# Patient Record
Sex: Male | Born: 1960
Health system: Southern US, Community
[De-identification: ages and names within clinical notes are randomized; demographics above are authoritative.]

## PROBLEM LIST (undated history)

## (undated) DIAGNOSIS — I10 Essential (primary) hypertension: Secondary | ICD-10-CM

## (undated) DIAGNOSIS — F32A Depression, unspecified: Secondary | ICD-10-CM

## (undated) DIAGNOSIS — S46101A Unspecified injury of muscle, fascia and tendon of long head of biceps, right arm, initial encounter: Secondary | ICD-10-CM

## (undated) DIAGNOSIS — T7840XA Allergy, unspecified, initial encounter: Secondary | ICD-10-CM

## (undated) DIAGNOSIS — F329 Major depressive disorder, single episode, unspecified: Secondary | ICD-10-CM

## (undated) DIAGNOSIS — E785 Hyperlipidemia, unspecified: Secondary | ICD-10-CM

## (undated) DIAGNOSIS — I2510 Atherosclerotic heart disease of native coronary artery without angina pectoris: Secondary | ICD-10-CM

## (undated) DIAGNOSIS — R011 Cardiac murmur, unspecified: Secondary | ICD-10-CM

## (undated) HISTORY — DX: Hyperlipidemia, unspecified: E78.5

## (undated) HISTORY — PX: CARDIAC CATHETERIZATION: SHX172

## (undated) HISTORY — PX: MOUTH SURGERY: SHX715

## (undated) HISTORY — PX: TONSILLECTOMY: SUR1361

## (undated) HISTORY — PX: OTHER SURGICAL HISTORY: SHX169

## (undated) HISTORY — DX: Essential (primary) hypertension: I10

## (undated) HISTORY — PX: APPENDECTOMY: SHX54

## (undated) HISTORY — DX: Depression, unspecified: F32.A

## (undated) HISTORY — DX: Major depressive disorder, single episode, unspecified: F32.9

---

## 1993-02-21 HISTORY — PX: TONSILLECTOMY: SHX5217

## 2003-02-28 ENCOUNTER — Encounter: Admission: RE | Admit: 2003-02-28 | Discharge: 2003-02-28 | Payer: Self-pay | Admitting: Sports Medicine

## 2008-11-03 ENCOUNTER — Ambulatory Visit: Payer: Self-pay | Admitting: Family Medicine

## 2008-11-03 DIAGNOSIS — I10 Essential (primary) hypertension: Secondary | ICD-10-CM | POA: Insufficient documentation

## 2008-11-03 DIAGNOSIS — E785 Hyperlipidemia, unspecified: Secondary | ICD-10-CM | POA: Insufficient documentation

## 2008-11-03 DIAGNOSIS — F329 Major depressive disorder, single episode, unspecified: Secondary | ICD-10-CM | POA: Insufficient documentation

## 2008-11-03 LAB — CONVERTED CEMR LAB
ALT: 25 units/L (ref 0–53)
AST: 24 units/L (ref 0–37)
Albumin: 4.5 g/dL (ref 3.5–5.2)
Alkaline Phosphatase: 62 units/L (ref 39–117)
BUN: 14 mg/dL (ref 6–23)
Bilirubin, Direct: 0 mg/dL (ref 0.0–0.3)
CO2: 30 meq/L (ref 19–32)
Calcium: 9.4 mg/dL (ref 8.4–10.5)
Chloride: 106 meq/L (ref 96–112)
Cholesterol: 162 mg/dL (ref 0–200)
Creatinine, Ser: 0.9 mg/dL (ref 0.4–1.5)
GFR calc non Af Amer: 95.25 mL/min (ref >60)
Glucose, Bld: 106 mg/dL — ABNORMAL HIGH (ref 70–99)
HDL: 42.2 mg/dL (ref >39.00)
LDL Cholesterol: 104 mg/dL — ABNORMAL HIGH (ref 0–99)
Potassium: 5.2 meq/L — ABNORMAL HIGH (ref 3.5–5.1)
Sodium: 141 meq/L (ref 135–145)
Total Bilirubin: 0.8 mg/dL (ref 0.3–1.2)
Total CHOL/HDL Ratio: 4
Total Protein: 7.9 g/dL (ref 6.0–8.3)
Triglycerides: 78 mg/dL (ref 0.0–149.0)
VLDL: 15.6 mg/dL (ref 0.0–40.0)

## 2009-01-05 ENCOUNTER — Telehealth: Payer: Self-pay | Admitting: Family Medicine

## 2009-10-29 ENCOUNTER — Ambulatory Visit: Payer: Self-pay | Admitting: Family Medicine

## 2009-10-29 LAB — CONVERTED CEMR LAB
ALT: 24 units/L (ref 0–53)
AST: 23 units/L (ref 0–37)
Albumin: 4.2 g/dL (ref 3.5–5.2)
Alkaline Phosphatase: 53 units/L (ref 39–117)
BUN: 18 mg/dL (ref 6–23)
Basophils Absolute: 0 10*3/uL (ref 0.0–0.1)
Basophils Relative: 0.5 % (ref 0.0–3.0)
Bilirubin Urine: NEGATIVE
Bilirubin, Direct: 0.1 mg/dL (ref 0.0–0.3)
CO2: 30 meq/L (ref 19–32)
Calcium: 9 mg/dL (ref 8.4–10.5)
Chloride: 105 meq/L (ref 96–112)
Cholesterol: 132 mg/dL (ref 0–200)
Creatinine, Ser: 0.8 mg/dL (ref 0.4–1.5)
Eosinophils Absolute: 0.2 10*3/uL (ref 0.0–0.7)
Eosinophils Relative: 2.1 % (ref 0.0–5.0)
GFR calc non Af Amer: 111.9 mL/min (ref >60)
Glucose, Bld: 85 mg/dL (ref 70–99)
HCT: 39.9 % (ref 39.0–52.0)
HDL: 36.1 mg/dL — ABNORMAL LOW (ref >39.00)
Hemoglobin, Urine: NEGATIVE
Hemoglobin: 13.7 g/dL (ref 13.0–17.0)
Ketones, ur: NEGATIVE mg/dL
LDL Cholesterol: 68 mg/dL (ref 0–99)
Leukocytes, UA: NEGATIVE
Lymphocytes Relative: 21.6 % (ref 12.0–46.0)
Lymphs Abs: 1.9 10*3/uL (ref 0.7–4.0)
MCHC: 34.4 g/dL (ref 30.0–36.0)
MCV: 86.8 fL (ref 78.0–100.0)
Monocytes Absolute: 0.8 10*3/uL (ref 0.1–1.0)
Monocytes Relative: 9.5 % (ref 3.0–12.0)
Neutro Abs: 5.7 10*3/uL (ref 1.4–7.7)
Neutrophils Relative %: 66.3 % (ref 43.0–77.0)
Nitrite: NEGATIVE
PSA: 1.94 ng/mL (ref 0.10–4.00)
Platelets: 192 10*3/uL (ref 150.0–400.0)
Potassium: 4.6 meq/L (ref 3.5–5.1)
RBC: 4.6 M/uL (ref 4.22–5.81)
RDW: 13.6 % (ref 11.5–14.6)
Sodium: 140 meq/L (ref 135–145)
Specific Gravity, Urine: >=1.03 (ref 1.000–1.030)
TSH: 0.77 microintl units/mL (ref 0.35–5.50)
Total Bilirubin: 0.6 mg/dL (ref 0.3–1.2)
Total CHOL/HDL Ratio: 4
Total Protein, Urine: NEGATIVE mg/dL
Total Protein: 6.5 g/dL (ref 6.0–8.3)
Triglycerides: 138 mg/dL (ref 0.0–149.0)
Urine Glucose: NEGATIVE mg/dL
Urobilinogen, UA: 0.2 (ref 0.0–1.0)
VLDL: 27.6 mg/dL (ref 0.0–40.0)
WBC: 8.6 10*3/uL (ref 4.5–10.5)
pH: 6 (ref 5.0–8.0)

## 2009-11-09 ENCOUNTER — Ambulatory Visit: Payer: Self-pay | Admitting: Family Medicine

## 2009-11-18 ENCOUNTER — Encounter (INDEPENDENT_AMBULATORY_CARE_PROVIDER_SITE_OTHER): Payer: Self-pay | Admitting: *Deleted

## 2009-12-24 ENCOUNTER — Encounter (INDEPENDENT_AMBULATORY_CARE_PROVIDER_SITE_OTHER): Payer: Self-pay | Admitting: *Deleted

## 2009-12-29 ENCOUNTER — Ambulatory Visit: Payer: Self-pay | Admitting: Gastroenterology

## 2010-01-12 ENCOUNTER — Ambulatory Visit: Payer: Self-pay | Admitting: Gastroenterology

## 2010-01-18 ENCOUNTER — Encounter: Payer: Self-pay | Admitting: Gastroenterology

## 2010-01-18 LAB — HM COLONOSCOPY

## 2010-03-23 NOTE — Progress Notes (Signed)
Summary: Samples Viagra  Phone Note Call from Patient Call back at Home Phone [phone] Call back at Work Phone [phone]   Caller: live Call For: Damon [name] Summary of Call: Requesting samples & Rx Viagra to [facility].  Just had checkup & everything was OK.  OV only if Dr. B says he needs it. Initial call taken by: Rudy Jew, RN,  January 05, 2009 8:43 AM  Follow-up for Phone Call        No samples at this time.  OK to refill Viagra 100 mg one by mouth once daily as needed #6 with as needed refills. Follow-up by: Evelena Peat MD,  January 05, 2009 8:57 AM  Additional Follow-up for Phone Call Additional follow up Details #1::        LMTCB Rudy Jew, RN  January 05, 2009 2:39 PM  Pt informed Rx sent to [place] [facility].  Additional Follow-up by: Sid Falcon LPN,  January 06, 2009 10:43 AM    New/Updated Medications: VIAGRA 100 MG TABS (SILDENAFIL CITRATE) One by mouth once daily prn Prescriptions: VIAGRA 100 MG TABS (SILDENAFIL CITRATE) One by mouth once daily prn  #6 x prn   Entered by:   Rudy Jew, RN   Authorized by:   Evelena Peat MD   Signed by:   Rudy Jew, RN on 01/05/2009   Method used:   Electronically to        [facility]* (retail)       [address]       [place], [place]  [address]       Ph: [phone]       Fax: [phone]   RxID:   [id number]

## 2010-03-23 NOTE — Letter (Signed)
Summary: Pre Visit Letter Revised  [facility]  [address] [place], [place] [address]   Phone: [phone]  Fax: [phone]        11/18/2009 MRN: [id number] Damon [name] [address] [place], [place]  [address]             Procedure Date:  01-12-10   Welcome to the Gastroenterology Division at [facility]    You are scheduled to see a nurse for your pre-procedure visit on 12-29-09 at 8:30a.m. on the 3rd floor at [facility], [address]  We ask that you try to arrive at our office 15 minutes prior to your appointment time to allow for check-in.  Please take a minute to review the attached form.  If you answer "Yes" to one or more of the questions on the first page, we ask that you call the person listed at your earliest opportunity.  If you answer "No" to all of the questions, please complete the rest of the form and bring it to your appointment.    Your nurse visit will consist of discussing your medical and surgical history, your immediate family medical history, and your medications.   If you are unable to list all of your medications on the form, please bring the medication bottles to your appointment and we will list them.  We will need to be aware of both prescribed and over the counter drugs.  We will need to know exact dosage information as well.    Please be prepared to read and sign documents such as consent forms, a financial agreement, and acknowledgement forms.  If necessary, and with your consent, a friend or relative is welcome to sit-in on the nurse visit with you.  Please bring your insurance card so that we may make a copy of it.  If your insurance requires a referral to see a specialist, please bring your referral form from your primary care physician.  No co-pay is required for this nurse visit.     If you cannot keep your appointment, please call [phone] to cancel or reschedule prior to your appointment date.  This allows [place]  the opportunity to schedule an appointment for another patient in need of care.    Thank you for choosing [facility] for your medical needs.  We appreciate the opportunity to care for you.  Please visit [place] at our website  to learn more about our practice.  Sincerely, The Gastroenterology Division

## 2010-03-23 NOTE — Letter (Signed)
Summary: Patient Notice- Polyp Results  [facility]  [address] [place], [place] [address]   Phone: [phone]  Fax: [phone]        January 18, 2010 MRN: [id number]    Damon [name] [address] [place], [place]  [address]    Dear Damon [name],  I am pleased to inform you that the colon polyp(s) removed during your recent colonoscopy was (were) found to be benign (no cancer detected) upon pathologic examination.  I recommend you have a repeat colonoscopy examination in _5 years to look for recurrent polyps, as having colon polyps increases your risk for having recurrent polyps or even colon cancer in the future.  Should you develop new or worsening symptoms of abdominal pain, bowel habit changes or bleeding from the rectum or bowels, please schedule an evaluation with either your primary care physician or with me.  Additional information/recommendations:  __ No further action with gastroenterology is needed at this time. Please      follow-up with your primary care physician for your other healthcare      needs.  __ Please call [phone] to schedule a return visit to review your      situation.  __ Please keep your follow-up visit as already scheduled.  __x Continue treatment plan as outlined the day of your exam.  Please call us if you are having persistent problems or have questions about your condition that have not been fully answered at this time.  Sincerely,  Louis Meckel MD  This letter has been electronically signed by your physician.  Appended Document: Patient Notice- Polyp Results Letter mailed

## 2010-03-23 NOTE — Miscellaneous (Signed)
Summary: [facility]  Clinical Lists Changes  Medications: Added new medication of MOVIPREP 100 GM  SOLR (PEG-KCL-NACL-NASULF-NA ASC-C) As per prep instructions. - Signed Rx of MOVIPREP 100 GM  SOLR (PEG-KCL-NACL-NASULF-NA ASC-C) As per prep instructions.;  #1 x 0;  Signed;  Entered by: Ezra Sites RN;  Authorized by: Louis Meckel MD;  Method used: Electronically to [facility]*, [address], [place], [place]  [address], Ph: [phone], Fax: [phone] Observations: Added new observation of NKA: T (12/29/2009 8:38)    Prescriptions: MOVIPREP 100 GM  SOLR (PEG-KCL-NACL-NASULF-NA ASC-C) As per prep instructions.  #1 x 0   Entered by:   Ezra Sites RN   Authorized by:   Louis Meckel MD   Signed by:   Ezra Sites RN on 12/29/2009   Method used:   Electronically to        [facility]* (retail)       [address]       [place], [place]  [id number]       Ph: [phone]       Fax: [phone]   RxID:   [id number]

## 2010-03-23 NOTE — Procedures (Signed)
Summary: Colonoscopy  Patient: Damon [name] Note: All result statuses are Final unless otherwise noted.  Tests: (1) Colonoscopy (COL)   COL Colonoscopy           DONE     [facility]     [address]     [place], [place]  [address]           COLONOSCOPY PROCEDURE REPORT           PATIENT:  [name], Damon  MR#:  [id number]     BIRTHDATE:  06/20/1960, 49 yrs. old  GENDER:  male           ENDOSCOPIST:  Barbette Hair. Arlyce Dice, MD     Referred by:  Evelena Peat, M.D.           PROCEDURE DATE:  01/12/2010     PROCEDURE:  Colonoscopy with snare polypectomy     ASA CLASS:  Class I     INDICATIONS:  1) Routine Risk Screening           MEDICATIONS:   Fentanyl 100 mcg IV, Versed 10 mg IV, Benadryl 25     mg IV           DESCRIPTION OF PROCEDURE:   After the risks benefits and     alternatives of the procedure were thoroughly explained, informed     consent was obtained.  Digital rectal exam was performed and     revealed no abnormalities.   The [id number] endoscope was     introduced through the anus and advanced to the cecum, which was     identified by the ileocecal valve, without limitations.  The     quality of the prep was excellent, using MoviPrep.  The instrument     was then slowly withdrawn as the colon was fully examined.     <<PROCEDUREIMAGES>>           FINDINGS:  A sessile polyp was found in the descending colon. It     was 4 mm in size. Polyp was snared without cautery. Retrieval was     successful (see image8). snare polyp  This was otherwise a normal     examination of the colon (see image1, image2, image3, image5,     image7, image12, and image14).   Retroflexed views in the rectum     revealed no abnormalities.    The time to cecum =  3.50  minutes.     The scope was then withdrawn (time =  13.75  min) from the patient     and the procedure completed.           COMPLICATIONS:  None           ENDOSCOPIC IMPRESSION:     1) 4 mm sessile polyp in the  descending colon     2) Otherwise normal examination     RECOMMENDATIONS:     1) If the polyp(s) removed today are proven to be adenomatous     (pre-cancerous) polyps, you will need a repeat colonoscopy in 5     years. Otherwise you should continue to follow colorectal cancer     screening guidelines for "routine risk" patients with colonoscopy     in 10 years.           REPEAT EXAM:   You will receive a letter from Dr. Arlyce Dice in 1-2     weeks, after reviewing the final pathology, with followup  recommendations.           ______________________________     Barbette Hair Arlyce Dice, MD           CC:           n.     eSIGNED:   Barbette Hair. Brea Coleson at 01/12/2010 09:51 AM           Geannie Risen, [id number]  Note: An exclamation mark (!) indicates a result that was not dispersed into the flowsheet. Document Creation Date: 01/12/2010 9:51 AM _______________________________________________________________________  (1) Order result status: Final Collection or observation date-time: 01/12/2010 09:46 Requested date-time:  Receipt date-time:  Reported date-time:  Referring Physician:   Ordering Physician: Melvia Heaps [phone]) Specimen Source:  Source: Launa Grill Order Number: [id number] Lab site:   Appended Document: Colonoscopy     Procedures Next Due Date:    Colonoscopy: 12/2014

## 2010-03-23 NOTE — Assessment & Plan Note (Signed)
Summary: CPX // RS   Vital Signs:  Patient profile:   50 year old male Height:      67 inches Weight:      167 pounds BMI:     26.25 O2 Sat:      98 % Temp:     87.8 degrees F oral Pulse rate:   73 / minute Resp:     12 per minute BP sitting:   108 / 78  Vitals Entered By: Lynann Beaver CMA (November 09, 2009 10:34 AM)  Nutrition Counseling: Patient's BMI is greater than 25 and therefore counseled on weight management options. CC: cpx, Hypertension Management Is Patient Diabetic? No Pain Assessment Patient in pain? no        History of Present Illness: Pt here for CPE.  Has hx of hypertension and hyperlipidemia.  Compliant with meds.  Last tetanus unknown. No hx of colonoscopy. Declines flu vaccine. Exercising regularly.  Acute issue of L shoulder pain for about 3 months.  Noted after overuse activity with pressure washing. Radiates to deltoid.  Exacerbated by lifting.  Achy quality.  Worse with abduction. No alleviating factors.  Hypertension History:      He denies headache, chest pain, palpitations, dyspnea with exertion, orthopnea, PND, peripheral edema, visual symptoms, neurologic problems, syncope, and side effects from treatment.  He notes no problems with any antihypertensive medication side effects.        Positive major cardiovascular risk factors include male age 37 years old or older, hyperlipidemia, and hypertension.     Current Medications (verified): 1)  Lipitor 20 Mg Tabs (Atorvastatin Calcium) .... Once Daily 2)  Lisinopril 20 Mg Tabs (Lisinopril) .... Once Daily 3)  Viagra 100 Mg Tabs (Sildenafil Citrate) .... One By Mouth Once Daily Prn  Allergies (verified): No Known Drug Allergies  Past History:  Past Medical History: Last updated: 11/03/2008 Depression Hyperlipidemia Hypertension  Past Surgical History: Last updated: 11/03/2008 Tonsillectomy 1995  Family History: Last updated: 11/03/2008 Family History Depression Family  History High cholesterol Family History Hypertension Family History of Prostate CA  Family History of Stroke F  Family history heart disease  Social History: Last updated: 11/03/2008 Occupation: [employer] Divorced Past smoker, quit at  age 74 PMH-FH-SH reviewed for relevance  Review of Systems  The patient denies anorexia, fever, weight loss, weight gain, vision loss, decreased hearing, hoarseness, chest pain, syncope, dyspnea on exertion, peripheral edema, prolonged cough, headaches, hemoptysis, abdominal pain, melena, hematochezia, severe indigestion/heartburn, hematuria, incontinence, genital sores, muscle weakness, suspicious skin lesions, transient blindness, difficulty walking, depression, unusual weight change, abnormal bleeding, enlarged lymph nodes, and testicular masses.    Physical Exam  General:  Well-developed,well-nourished,in no acute distress; alert,appropriate and cooperative throughout examination Head:  Normocephalic and atraumatic without obvious abnormalities. No apparent alopecia or balding. Eyes:  No corneal or conjunctival inflammation noted. EOMI. Perrla. Funduscopic exam benign, without hemorrhages, exudates or papilledema. Vision grossly normal. Ears:  External ear exam shows no significant lesions or deformities.  Otoscopic examination reveals clear canals, tympanic membranes are intact bilaterally without bulging, retraction, inflammation or discharge. Hearing is grossly normal bilaterally. Mouth:  Oral mucosa and oropharynx without lesions or exudates.  Teeth in good repair. Neck:  No deformities, masses, or tenderness noted. Lungs:  Normal respiratory effort, chest expands symmetrically. Lungs are clear to auscultation, no crackles or wheezes. Heart:  Normal rate and regular rhythm. S1 and S2 normal without gallop, murmur, click, rub or other extra sounds. Abdomen:  Bowel sounds positive,abdomen soft and non-tender  without masses, organomegaly or hernias  noted. Rectal:  No external abnormalities noted. Normal sphincter tone. No rectal masses or tenderness. Prostate:  Prostate gland firm and smooth, no enlargement, nodularity, tenderness, mass, asymmetry or induration. Msk:  No deformity or scoliosis noted of thoracic or lumbar spine.   Extremities:  L shoulder-no muscle atrophy.  No point tenderness.  Pain with abduction and internal rotation.  No biceps or deltoid tenderness. Neurologic:  alert & oriented X3, cranial nerves II-XII intact, strength normal in all extremities, and gait normal.   Skin:  Intact without suspicious lesions or rashes Cervical Nodes:  No lymphadenopathy noted Psych:  Cognition and judgment appear intact. Alert and cooperative with normal attention span and concentration. No apparent delusions, illusions, hallucinations   Impression & Recommendations:  Problem # 1:  ROUTINE GENERAL MEDICAL EXAM@HEALTH  CARE FACL (ICD-V70.0) labs reviewed.  Colonoscopy recommeded.  Tdap given.  Offered flu vaccine and pt declines. Orders: Gastroenterology Referral (GI)  Problem # 2:  ROTATOR CUFF SYNDROME (ICD-726.10) Assessment: New  Hopefully just has some inflammation.  Cannot r/o partial tear but less likely.  Discussed trial of steroid injection and after review of risks and benefits, pt consents.  Prepped L shoulder with betadine and using post-lat approach, inj 40 mg depomedrol and 2 cc plain xylocaine without difficulty.  Pt tolerated well and to be in touch if no  better in 2 weeks.  Consider MRI if no better in 2-3 weeks.  Orders: Joint Aspirate / Injection, Large (20610) Depo- Medrol 40mg  (J1030)  Complete Medication List: 1)  Lipitor 20 Mg Tabs (Atorvastatin calcium) .... Once daily 2)  Lisinopril 20 Mg Tabs (Lisinopril) .... Once daily 3)  Viagra 100 Mg Tabs (Sildenafil citrate) .... One by mouth once daily prn  Other Orders: Tdap => 23yrs IM (51025) Admin 1st Vaccine (85277)  Hypertension Assessment/Plan:       The patient's hypertensive risk group is category B: At least one risk factor (excluding diabetes) with no target organ damage.  His calculated 10 year risk of coronary heart disease is 4 %.  Today's blood pressure is 108/78.    Patient Instructions: 1)  Touch base by end of week if shoulder is not improved. 2)  We will call you regarding colonoscopy Prescriptions: LIPITOR 20 MG TABS (ATORVASTATIN CALCIUM) once daily  #30 x 11   Entered and Authorized by:   Evelena Peat MD   Signed by:   Evelena Peat MD on 11/09/2009   Method used:   Electronically to        [facility]* (retail)       [address]       [place], [place]  [id number]       Ph: [phone]       Fax: [phone]   RxID:   [id number] LISINOPRIL 20 MG TABS (LISINOPRIL) once daily  #365 x 0   Entered and Authorized by:   Evelena Peat MD   Signed by:   Evelena Peat MD on 11/09/2009   Method used:   Faxed to ...       [facility] (retail)       [phone]       [place], [place]  [address]       Ph: [phone]       Fax: [phone]   RxID:   [id number]      Immunizations Administered:  Tetanus Vaccine:    Vaccine Type: Tdap  Site: right deltoid    Mfr: GlaxoSmithKline    Dose: 0.5 ml    Route: IM    Given by: Lynann Beaver CMA    Exp. Date: 11/02/2011    Lot #: [id number]

## 2010-03-23 NOTE — Assessment & Plan Note (Signed)
Summary: fu on meds/ pt will come in fasting/njr   Vital Signs:  Patient profile:   50 year old male Height:      68 inches Weight:      166 pounds BMI:     25.33 Temp:     98.2 degrees F oral Pulse rate:   72 / minute Pulse rhythm:   regular Resp:     12 per minute BP sitting:   110 / 78  (left arm) Cuff size:   regular  Vitals Entered By: Sid Falcon LPN (November 03, 2008 8:38 AM) CC: New pt to establish, med refills needed   History of Present Illness: Patient is seen to establish care. Chronic problems include history of hypertension and hyperlipidemia. Meds reviewed. He had prior history of depression which is currently stable off medications. Surgical history significant for tonsillectomy. Patient exercises regularly. No recent chest pains or dizziness. Family history significant for hypertension and hyperlipidemia. No premature coronary disease in any first-degree relatives. Patient quit smoking age 36.  Allergies (verified): No Known Drug Allergies  Past History:  Family History: Last updated: 11/03/2008 Family History Depression Family History High cholesterol Family History Hypertension Family History of Prostate CA  Family History of Stroke F  Family history heart disease  Social History: Last updated: 11/03/2008 Occupation: [employer] Divorced Past smoker, quit at  age 36  Past Medical History: Depression Hyperlipidemia Hypertension  Past Surgical History: Tonsillectomy 1995  Family History: Family History Depression Family History High cholesterol Family History Hypertension Family History of Prostate CA  Family History of Stroke F  Family history heart disease  Social History: Occupation: [employer] Divorced Past smoker, quit at  age 35Occupation:  employed  Review of Systems  The patient denies chest pain, syncope, dyspnea on exertion, peripheral edema, prolonged cough, headaches, hemoptysis, abdominal pain, melena, hematochezia, and severe  indigestion/heartburn.    Physical Exam  General:  Well-developed,well-nourished,in no acute distress; alert,appropriate and cooperative throughout examination Eyes:  No corneal or conjunctival inflammation noted. EOMI. Perrla. Funduscopic exam benign, without hemorrhages, exudates or papilledema. Vision grossly normal. Ears:  External ear exam shows no significant lesions or deformities.  Otoscopic examination reveals clear canals, tympanic membranes are intact bilaterally without bulging, retraction, inflammation or discharge. Hearing is grossly normal bilaterally. Mouth:  Oral mucosa and oropharynx without lesions or exudates.  Teeth in good repair. Neck:  No deformities, masses, or tenderness noted.no carotid bruits Lungs:  Normal respiratory effort, chest expands symmetrically. Lungs are clear to auscultation, no crackles or wheezes. Heart:  Normal rate and regular rhythm. S1 and S2 normal without gallop, murmur, click, rub or other extra sounds. Extremities:  No clubbing, cyanosis, edema, or deformity noted with normal full range of motion of all joints.     Impression & Recommendations:  Problem # 1:  HYPERTENSION (ICD-401.9)  well-controlled.  Refilled meds for 1 year. His updated medication list for this problem includes:    Lisinopril 20 Mg Tabs (Lisinopril) ..... Once daily  Orders: TLB-BMP (Basic Metabolic Panel-BMET) (80048-METABOL) Venipuncture (45409)  Problem # 2:  HYPERLIPIDEMIA (ICD-272.4)  reassess lipids today His updated medication list for this problem includes:    Lipitor 20 Mg Tabs (Atorvastatin calcium) ..... Once daily  Orders: TLB-Lipid Panel (80061-LIPID) TLB-Hepatic/Liver Function Pnl (80076-HEPATIC) Venipuncture (81[address])  Problem # 3:  DEPRESSION (ICD-311) Stable off medications.  Complete Medication List: 1)  Lipitor 20 Mg Tabs (Atorvastatin calcium) .... Once daily 2)  Lisinopril 20 Mg Tabs (Lisinopril) .... Once daily  Patient  Instructions: 1)  Schedule complete physical by age 4. 2)  It is important that you exercise reguarly at least 20 minutes 5 times a week. If you develop chest pain, have severe difficulty breathing, or feel very tired, stop exercising immediately and seek medical attention.  Prescriptions: LISINOPRIL 20 MG TABS (LISINOPRIL) once daily  #365 x 0   Entered and Authorized by:   Evelena Peat MD   Signed by:   Evelena Peat MD on 11/03/2008   Method used:   Faxed to ...       [facility] (retail)       [phone]       [place], [place]  [address]       Ph: [phone]       Fax: [phone]   RxID:   [id number] LIPITOR 20 MG TABS (ATORVASTATIN CALCIUM) once daily  #30 x 11   Entered and Authorized by:   Evelena Peat MD   Signed by:   Evelena Peat MD on 11/03/2008   Method used:   Electronically to        [facility]* (retail)       [address]       [place], [place]  [address]       Ph: [phone]       Fax: [phone]   RxID:   [id number]

## 2010-03-23 NOTE — Letter (Signed)
Summary: Moviprep Instructions  [facility]  [address]   [place], [place] [address]   Phone: [phone]  Fax: [phone]       Damon [name]    [birth year]    MRN: [id number]        Procedure Day Dorna Bloom: Tuesday, 01-12-10     Arrival Time: 8:00 a.m.     Procedure Time: 9:00 a.m.     Location of Procedure:                    x  [facility] (4th Floor)   PREPARATION FOR COLONOSCOPY WITH MOVIPREP   Starting 5 days prior to your procedure 01-07-10  do not eat nuts, seeds, popcorn, corn, beans, peas,  salads, or any raw vegetables.  Do not take any fiber supplements (e.g. Metamucil, Citrucel, and Benefiber).  THE DAY BEFORE YOUR PROCEDURE         DATE: 01-11-10  DAY: Monday  1.  Drink clear liquids the entire day-NO SOLID FOOD  2.  Do not drink anything colored red or purple.  Avoid juices with pulp.  No orange juice.  3.  Drink at least 64 oz. (8 glasses) of fluid/clear liquids during the day to prevent dehydration and help the prep work efficiently.  CLEAR LIQUIDS INCLUDE: Water Jello Ice Popsicles Tea (sugar ok, no milk/cream) Powdered fruit flavored drinks Coffee (sugar ok, no milk/cream) Gatorade Juice: apple, white grape, white cranberry  Lemonade Clear bullion, consomm, broth Carbonated beverages (any kind) Strained chicken noodle soup Hard Candy                             4.  In the morning, mix first dose of MoviPrep solution:    Empty 1 Pouch A and 1 Pouch B into the disposable container    Add lukewarm drinking water to the top line of the container. Mix to dissolve    Refrigerate (mixed solution should be used within 24 hrs)  5.  Begin drinking the prep at 5:00 p.m. The MoviPrep container is divided by 4 marks.   Every 15 minutes drink the solution down to the next mark (approximately 8 oz) until the full liter is complete.   6.  Follow completed prep with 16 oz of clear liquid of your choice (Nothing red or purple).   Continue to drink clear liquids until bedtime.  7.  Before going to bed, mix second dose of MoviPrep solution:    Empty 1 Pouch A and 1 Pouch B into the disposable container    Add lukewarm drinking water to the top line of the container. Mix to dissolve    Refrigerate  THE DAY OF YOUR PROCEDURE      DATE: 01-12-10  DAY: Tuesday Beginning at 4:00 a.m. (5 hours before procedure):         1. Every 15 minutes, drink the solution down to the next mark (approx 8 oz) until the full liter is complete.  2. Follow completed prep with 16 oz. of clear liquid of your choice.    3. You may drink clear liquids until 7:00 a.m.  (2 HOURS BEFORE PROCEDURE).   MEDICATION INSTRUCTIONS  Unless otherwise instructed, you should take regular prescription medications with a small sip of water   as early as possible the morning of your procedure.           OTHER INSTRUCTIONS  You will need a responsible  adult at least 51 years of age to accompany you and drive you home.   This person must remain in the waiting room during your procedure.  Wear loose fitting clothing that is easily removed.  Leave jewelry and other valuables at home.  However, you may wish to bring a book to read or  an iPod/MP3 player to listen to music as you wait for your procedure to start.  Remove all body piercing jewelry and leave at home.  Total time from sign-in until discharge is approximately 2-3 hours.  You should go home directly after your procedure and rest.  You can resume normal activities the  day after your procedure.  The day of your procedure you should not:   Drive   Make legal decisions   Operate machinery   Drink alcohol   Return to work  You will receive specific instructions about eating, activities and medications before you leave.    The above instructions have been reviewed and explained to me by   Ezra Sites RN  December 29, 2009 8:55 AM     I fully understand and can  verbalize these instructions _____________________________ Date _________

## 2010-06-14 ENCOUNTER — Telehealth: Payer: Self-pay | Admitting: *Deleted

## 2010-06-14 NOTE — Telephone Encounter (Signed)
Pt strained his lower back this week, and wants Dr. Caryl Never to call in Ibuprofen 1200 mg daily to [facility].  Prefers not to come in for an office visit.

## 2010-06-14 NOTE — Telephone Encounter (Signed)
May call in Ibuprofen 800 mg po q 8 hours prn back pain, disp #60 and needs to be seen if no better in 2 weeks.

## 2010-06-15 MED ORDER — IBUPROFEN 800 MG PO TABS: 800.0000 mg | ORAL_TABLET | Freq: Three times a day (TID) | ORAL | Status: DC | PRN

## 2010-06-15 MED ORDER — IBUPROFEN 800 MG PO TABS: 800.0000 mg | ORAL_TABLET | Freq: Three times a day (TID) | ORAL | Status: AC | PRN

## 2010-06-15 NOTE — Telephone Encounter (Signed)
Pt changed pharmacy from [facility] to [facility].

## 2010-08-11 ENCOUNTER — Other Ambulatory Visit: Payer: Self-pay | Admitting: Family Medicine

## 2010-08-12 ENCOUNTER — Other Ambulatory Visit: Payer: Self-pay | Admitting: Family Medicine

## 2010-08-12 NOTE — Telephone Encounter (Signed)
Denied ibuprofen 800 refill request - not rx'd by dr. Caryl Never that I can see - last seen 10/2009 and this was not on current med list. KIK

## 2010-08-17 ENCOUNTER — Telehealth: Payer: Self-pay

## 2010-08-17 NOTE — Telephone Encounter (Signed)
Received refill request for ibuprofen 800mg . Reviewed epic and EMR, medication never prescribed by MD, pharmacy notified via fax

## 2010-08-18 ENCOUNTER — Other Ambulatory Visit: Payer: Self-pay | Admitting: Family Medicine

## 2010-08-18 ENCOUNTER — Telehealth: Payer: Self-pay | Admitting: Family Medicine

## 2010-08-18 NOTE — Telephone Encounter (Signed)
May  Refill times three.

## 2010-08-18 NOTE — Telephone Encounter (Signed)
This med is not on pt med list in EMR or Epic, last seen 10/2009 Please advise

## 2010-08-18 NOTE — Telephone Encounter (Signed)
No record of Rx Ibuprofen in Epic or EMR, pt informed on personally identified no record of request from [facility] either?

## 2010-08-18 NOTE — Telephone Encounter (Signed)
Pt called 6/27 and states he tried to pick up a refill of Ibuprofen at [facility] and could not. According to pt, Dr. Lucie Leather office would not authorize a refill. He is unsure why. Please call pt when possible.

## 2010-08-19 ENCOUNTER — Telehealth: Payer: Self-pay | Admitting: *Deleted

## 2010-08-19 NOTE — Telephone Encounter (Signed)
I thought I already answered but OK to refill Motrin 800 mg po q 8 hours prn #60 with one refill.

## 2010-08-19 NOTE — Telephone Encounter (Signed)
Pt got Ibuprofen on 06/14/2010 at [facility], and would like to have Dr. Caryl Never refill this , please.

## 2010-08-20 MED ORDER — IBUPROFEN 800 MG PO TABS: 800.0000 mg | ORAL_TABLET | Freq: Three times a day (TID) | ORAL | Status: DC | PRN

## 2010-09-30 ENCOUNTER — Encounter: Payer: Self-pay | Admitting: Family Medicine

## 2010-10-01 ENCOUNTER — Encounter: Payer: Self-pay | Admitting: Family Medicine

## 2010-10-01 ENCOUNTER — Ambulatory Visit (INDEPENDENT_AMBULATORY_CARE_PROVIDER_SITE_OTHER): Payer: BC Managed Care – PPO | Admitting: Family Medicine

## 2010-10-01 DIAGNOSIS — L821 Other seborrheic keratosis: Secondary | ICD-10-CM

## 2010-10-01 NOTE — Patient Instructions (Signed)
This is a seborrheic keratosis which is benign.

## 2010-10-01 NOTE — Progress Notes (Signed)
  Subjective:    Patient ID: Damon [name], male    DOB: 12-May-1960, 50 y.o.   MRN: [id number]  HPI Brownish spot noted on head slightly raised recently. No itching or bleeding. No personal or family history of skin cancer. No other concerning lesions noted.  Present for several months.  No hx of rapid growth.   Review of Systems  Constitutional: Negative for appetite change and unexpected weight change.       Objective:   Physical Exam  Constitutional: He appears well-developed and well-nourished. No distress.  Cardiovascular: Normal rate and regular rhythm.   Skin:       Patient has 7-8 mm slightly raised well demarcated homogenous brown slightly scaly lesion right parietal area          Assessment & Plan:  Benign-appearing seborrheic keratosis right parietal region. Patient requesting treatment. Reviewed risks and benefits of treatment with liquid nitrogen. Patient is treated without difficulty. Use topical antibiotic for any blistering and followup promptly for signs of secondary infection.  Discussed sun protection.

## 2010-11-04 ENCOUNTER — Other Ambulatory Visit (INDEPENDENT_AMBULATORY_CARE_PROVIDER_SITE_OTHER): Payer: BC Managed Care – PPO

## 2010-11-04 DIAGNOSIS — Z0000 Encounter for general adult medical examination without abnormal findings: Secondary | ICD-10-CM

## 2010-11-04 LAB — POCT URINALYSIS DIPSTICK
Bilirubin, UA: NEGATIVE
Blood, UA: NEGATIVE
Glucose, UA: NEGATIVE
Ketones, UA: NEGATIVE
Leukocytes, UA: NEGATIVE
Nitrite, UA: NEGATIVE
Protein, UA: NEGATIVE
Spec Grav, UA: 1.025
Urobilinogen, UA: 0.2
pH, UA: 6

## 2010-11-04 LAB — LIPID PANEL
Cholesterol: 156 mg/dL (ref 0–200)
HDL: 37.8 mg/dL — ABNORMAL LOW
Total CHOL/HDL Ratio: 4
Triglycerides: 264 mg/dL — ABNORMAL HIGH (ref 0.0–149.0)
VLDL: 52.8 mg/dL — ABNORMAL HIGH (ref 0.0–40.0)

## 2010-11-04 LAB — CBC WITH DIFFERENTIAL/PLATELET
Basophils Absolute: 0 10*3/uL (ref 0.0–0.1)
Basophils Relative: 0.4 % (ref 0.0–3.0)
Eosinophils Absolute: 0.4 10*3/uL (ref 0.0–0.7)
Eosinophils Relative: 5.4 % — ABNORMAL HIGH (ref 0.0–5.0)
HCT: 41.9 % (ref 39.0–52.0)
Hemoglobin: 13.8 g/dL (ref 13.0–17.0)
Lymphocytes Relative: 22.5 % (ref 12.0–46.0)
Lymphs Abs: 1.8 10*3/uL (ref 0.7–4.0)
MCHC: 33 g/dL (ref 30.0–36.0)
MCV: 89.3 fl (ref 78.0–100.0)
Monocytes Absolute: 0.9 10*3/uL (ref 0.1–1.0)
Monocytes Relative: 10.6 % (ref 3.0–12.0)
Neutro Abs: 5 10*3/uL (ref 1.4–7.7)
Neutrophils Relative %: 61.1 % (ref 43.0–77.0)
Platelets: 210 10*3/uL (ref 150.0–400.0)
RBC: 4.69 Mil/uL (ref 4.22–5.81)
RDW: 13.9 % (ref 11.5–14.6)
WBC: 8.1 10*3/uL (ref 4.5–10.5)

## 2010-11-04 LAB — HEPATIC FUNCTION PANEL
ALT: 31 U/L (ref 0–53)
AST: 27 U/L (ref 0–37)
Albumin: 4.2 g/dL (ref 3.5–5.2)
Alkaline Phosphatase: 66 U/L (ref 39–117)
Bilirubin, Direct: 0.1 mg/dL (ref 0.0–0.3)
Total Bilirubin: 0.3 mg/dL (ref 0.3–1.2)
Total Protein: 6.9 g/dL (ref 6.0–8.3)

## 2010-11-04 LAB — BASIC METABOLIC PANEL
BUN: 24 mg/dL — ABNORMAL HIGH (ref 6–23)
CO2: 28 mEq/L (ref 19–32)
Calcium: 9.4 mg/dL (ref 8.4–10.5)
Chloride: 107 mEq/L (ref 96–112)
Creatinine, Ser: 0.9 mg/dL (ref 0.4–1.5)
GFR: 92.11 mL/min (ref >60.00)
Glucose, Bld: 132 mg/dL — ABNORMAL HIGH (ref 70–99)
Potassium: 5.3 mEq/L — ABNORMAL HIGH (ref 3.5–5.1)
Sodium: 143 mEq/L (ref 135–145)

## 2010-11-04 LAB — PSA: PSA: 2.23 ng/mL (ref 0.10–4.00)

## 2010-11-04 LAB — LDL CHOLESTEROL, DIRECT: Direct LDL: 87 mg/dL

## 2010-11-04 LAB — TSH: TSH: 1.12 u[IU]/mL (ref 0.35–5.50)

## 2010-11-11 ENCOUNTER — Encounter: Payer: Self-pay | Admitting: Family Medicine

## 2010-11-11 ENCOUNTER — Ambulatory Visit (INDEPENDENT_AMBULATORY_CARE_PROVIDER_SITE_OTHER): Payer: BC Managed Care – PPO | Admitting: Family Medicine

## 2010-11-11 VITALS — BP 120/82 | HR 72 | Temp 97.8°F | Resp 12 | Ht 67.25 in | Wt 175.0 lb

## 2010-11-11 DIAGNOSIS — Z0000 Encounter for general adult medical examination without abnormal findings: Secondary | ICD-10-CM

## 2010-11-11 MED ORDER — LISINOPRIL 20 MG PO TABS: 20.0000 mg | ORAL_TABLET | Freq: Every day | ORAL | Status: DC

## 2010-11-11 MED ORDER — ATORVASTATIN CALCIUM 20 MG PO TABS: 20.0000 mg | ORAL_TABLET | Freq: Every day | ORAL | Status: DC

## 2010-11-11 NOTE — Progress Notes (Signed)
  Subjective:    Patient ID: Damon [name], male    DOB: 05/01/1960, 50 y.o.   MRN: [id number]  HPI Complete physical examination. Patient has history of hyperlipidemia and hypertension. Medications reviewed. Tetanus up-to-date. Colonoscopy last year. Exercising 30 minutes 5 days per week. Has had some recent weight gain. Poor dietary compliance at times. Recent lab work was not totally fasting as he had some juice that morning.  Past Medical History  Diagnosis Date  . Depression   . Hyperlipidemia   . Hypertension    Past Surgical History  Procedure Date  . Tonsillectomy 1995    reports that he quit smoking about 15 years ago. He does not have any smokeless tobacco history on file. His alcohol and drug histories not on file. family history includes Cancer in his other; Depression in his other; Heart disease in his other; Hyperlipidemia in his other; Hypertension in his other; and Stroke in his other. No Known Allergies    Review of Systems  Constitutional: Negative for fever, activity change, appetite change and fatigue.  HENT: Negative for ear pain, congestion and trouble swallowing.   Eyes: Negative for pain and visual disturbance.  Respiratory: Negative for cough, shortness of breath and wheezing.   Cardiovascular: Negative for chest pain and palpitations.  Gastrointestinal: Negative for nausea, vomiting, abdominal pain, diarrhea, constipation, blood in stool, abdominal distention and rectal pain.  Genitourinary: Negative for dysuria, hematuria and testicular pain.  Musculoskeletal: Negative for joint swelling and arthralgias.  Skin: Negative for rash.  Neurological: Negative for dizziness, syncope and headaches.  Hematological: Negative for adenopathy.  Psychiatric/Behavioral: Negative for confusion and dysphoric mood.       Objective:   Physical Exam  Constitutional: He is oriented to person, place, and time. He appears well-developed and well-nourished. No distress.    HENT:  Head: Normocephalic and atraumatic.  Right Ear: External ear normal.  Left Ear: External ear normal.  Mouth/Throat: Oropharynx is clear and moist.  Eyes: Conjunctivae and EOM are normal. Pupils are equal, round, and reactive to light.  Neck: Normal range of motion. Neck supple. No thyromegaly present.  Cardiovascular: Normal rate, regular rhythm and normal heart sounds.   No murmur heard. Pulmonary/Chest: No respiratory distress. He has no wheezes. He has no rales.  Abdominal: Soft. Bowel sounds are normal. He exhibits no distension and no mass. There is no tenderness. There is no rebound and no guarding.  Genitourinary: Rectum normal and prostate normal.  Musculoskeletal: He exhibits no edema.  Lymphadenopathy:    He has no cervical adenopathy.  Neurological: He is alert and oriented to person, place, and time. He displays normal reflexes. No cranial nerve deficit.  Skin: No rash noted.  Psychiatric: He has a normal mood and affect.          Assessment & Plan:  Complete physical. Flu vaccine offered but declined. Colonoscopy up-to-date. Labs reviewed with patient. Glucose 132 but nonfasting. Mildly elevated triglycerides. Work on weight loss and reduction of sugars and starches. Medications including lisinopril and atorvastatin refilled for one year

## 2010-11-11 NOTE — Patient Instructions (Signed)
Work on weight loss and continue with consistent exercise.

## 2010-12-27 ENCOUNTER — Telehealth: Payer: Self-pay | Admitting: Family Medicine

## 2010-12-27 MED ORDER — IBUPROFEN 800 MG PO TABS: 800.0000 mg | ORAL_TABLET | Freq: Three times a day (TID) | ORAL | Status: DC | PRN

## 2010-12-27 NOTE — Telephone Encounter (Signed)
Refill IB 800mg  to [phone]. Thanks.

## 2011-05-25 ENCOUNTER — Other Ambulatory Visit: Payer: Self-pay | Admitting: Family Medicine

## 2011-11-07 ENCOUNTER — Other Ambulatory Visit (INDEPENDENT_AMBULATORY_CARE_PROVIDER_SITE_OTHER): Payer: BC Managed Care – PPO

## 2011-11-07 DIAGNOSIS — Z0000 Encounter for general adult medical examination without abnormal findings: Secondary | ICD-10-CM

## 2011-11-07 LAB — BASIC METABOLIC PANEL
BUN: 18 mg/dL (ref 6–23)
CO2: 28 mEq/L (ref 19–32)
Calcium: 9.3 mg/dL (ref 8.4–10.5)
Chloride: 103 mEq/L (ref 96–112)
Creatinine, Ser: 0.8 mg/dL (ref 0.4–1.5)
GFR: 103.32 mL/min (ref >60.00)
Glucose, Bld: 102 mg/dL — ABNORMAL HIGH (ref 70–99)
Potassium: 5.1 mEq/L (ref 3.5–5.1)
Sodium: 138 mEq/L (ref 135–145)

## 2011-11-07 LAB — LIPID PANEL
Cholesterol: 120 mg/dL (ref 0–200)
HDL: 41.6 mg/dL (ref >39.00)
LDL Cholesterol: 65 mg/dL (ref 0–99)
Total CHOL/HDL Ratio: 3
Triglycerides: 65 mg/dL (ref 0.0–149.0)
VLDL: 13 mg/dL (ref 0.0–40.0)

## 2011-11-07 LAB — POCT URINALYSIS DIPSTICK
Bilirubin, UA: NEGATIVE
Blood, UA: NEGATIVE
Glucose, UA: NEGATIVE
Ketones, UA: NEGATIVE
Leukocytes, UA: NEGATIVE
Nitrite, UA: NEGATIVE
Protein, UA: NEGATIVE
Spec Grav, UA: 1.02
Urobilinogen, UA: 0.2
pH, UA: 6

## 2011-11-07 LAB — PSA: PSA: 1.9 ng/mL (ref 0.10–4.00)

## 2011-11-07 LAB — HEPATIC FUNCTION PANEL
ALT: 27 U/L (ref 0–53)
AST: 31 U/L (ref 0–37)
Albumin: 4.1 g/dL (ref 3.5–5.2)
Alkaline Phosphatase: 59 U/L (ref 39–117)
Bilirubin, Direct: 0.1 mg/dL (ref 0.0–0.3)
Total Bilirubin: 0.3 mg/dL (ref 0.3–1.2)
Total Protein: 6.8 g/dL (ref 6.0–8.3)

## 2011-11-07 LAB — CBC WITH DIFFERENTIAL/PLATELET
Basophils Absolute: 0 10*3/uL (ref 0.0–0.1)
Basophils Relative: 0.4 % (ref 0.0–3.0)
Eosinophils Absolute: 0.1 10*3/uL (ref 0.0–0.7)
Eosinophils Relative: 1.2 % (ref 0.0–5.0)
HCT: 40.1 % (ref 39.0–52.0)
Hemoglobin: 12.9 g/dL — ABNORMAL LOW (ref 13.0–17.0)
Lymphocytes Relative: 19.2 % (ref 12.0–46.0)
Lymphs Abs: 1.9 10*3/uL (ref 0.7–4.0)
MCHC: 32.3 g/dL (ref 30.0–36.0)
MCV: 89 fl (ref 78.0–100.0)
Monocytes Absolute: 1 10*3/uL (ref 0.1–1.0)
Monocytes Relative: 10.1 % (ref 3.0–12.0)
Neutro Abs: 6.8 10*3/uL (ref 1.4–7.7)
Neutrophils Relative %: 69.1 % (ref 43.0–77.0)
Platelets: 222 10*3/uL (ref 150.0–400.0)
RBC: 4.5 Mil/uL (ref 4.22–5.81)
RDW: 13.3 % (ref 11.5–14.6)
WBC: 9.9 10*3/uL (ref 4.5–10.5)

## 2011-11-07 LAB — TSH: TSH: 1.07 u[IU]/mL (ref 0.35–5.50)

## 2011-11-14 ENCOUNTER — Encounter: Payer: Self-pay | Admitting: Family Medicine

## 2011-11-14 ENCOUNTER — Ambulatory Visit (INDEPENDENT_AMBULATORY_CARE_PROVIDER_SITE_OTHER): Payer: BC Managed Care – PPO | Admitting: Family Medicine

## 2011-11-14 VITALS — BP 105/68 | HR 72 | Temp 98.6°F | Resp 12 | Ht 67.5 in | Wt 170.0 lb

## 2011-11-14 DIAGNOSIS — Z23 Encounter for immunization: Secondary | ICD-10-CM

## 2011-11-14 DIAGNOSIS — Z0000 Encounter for general adult medical examination without abnormal findings: Secondary | ICD-10-CM

## 2011-11-14 MED ORDER — LISINOPRIL 20 MG PO TABS: 20.0000 mg | ORAL_TABLET | Freq: Every day | ORAL | Status: DC

## 2011-11-14 MED ORDER — ATORVASTATIN CALCIUM 20 MG PO TABS: 20.0000 mg | ORAL_TABLET | Freq: Every day | ORAL | Status: DC

## 2011-11-14 NOTE — Progress Notes (Signed)
  Subjective:    Patient ID: Damon [name], male    DOB: 1961-01-30, 51 y.o.   MRN: [id number]  HPI  Patient seen for complete physical. He has history of hypertension and hyperlipidemia. Compliant with medications. No side effects. Exercises regularly with tennis about 5 days per week. Last tetanus 2011. Colonoscopy 2011. No flu vaccine yet. No contraindications. He has no specific complaints today.  Past Medical History  Diagnosis Date  . Depression   . Hyperlipidemia   . Hypertension    Past Surgical History  Procedure Date  . Tonsillectomy 1995    reports that he quit smoking about 16 years ago. He does not have any smokeless tobacco history on file. His alcohol and drug histories not on file. family history includes Cancer in his other and paternal grandfather; Depression in his other; Heart disease in his other and paternal grandfather; Hyperlipidemia in his other; Hypertension in his other; and Stroke in his other. No Known Allergies    Review of Systems  Constitutional: Negative for fever, activity change, appetite change, fatigue and unexpected weight change.  HENT: Negative for ear pain, congestion and trouble swallowing.   Eyes: Negative for pain and visual disturbance.  Respiratory: Negative for cough, shortness of breath and wheezing.   Cardiovascular: Negative for chest pain and palpitations.  Gastrointestinal: Negative for nausea, vomiting, abdominal pain, diarrhea, constipation, blood in stool, abdominal distention and rectal pain.  Genitourinary: Negative for dysuria, hematuria and testicular pain.  Musculoskeletal: Negative for joint swelling and arthralgias.  Skin: Negative for rash.  Neurological: Negative for dizziness, syncope and headaches.  Hematological: Negative for adenopathy.  Psychiatric/Behavioral: Negative for confusion and dysphoric mood.       Objective:   Physical Exam  Constitutional: He is oriented to person, place, and time. He appears  well-developed and well-nourished. No distress.  HENT:  Head: Normocephalic and atraumatic.  Right Ear: External ear normal.  Left Ear: External ear normal.  Mouth/Throat: Oropharynx is clear and moist.  Eyes: Conjunctivae normal and EOM are normal. Pupils are equal, round, and reactive to light.  Neck: Normal range of motion. Neck supple. No thyromegaly present.  Cardiovascular: Normal rate, regular rhythm and normal heart sounds.   No murmur heard. Pulmonary/Chest: No respiratory distress. He has no wheezes. He has no rales.  Abdominal: Soft. Bowel sounds are normal. He exhibits no distension and no mass. There is no tenderness. There is no rebound and no guarding.  Musculoskeletal: He exhibits no edema.  Lymphadenopathy:    He has no cervical adenopathy.  Neurological: He is alert and oriented to person, place, and time. He displays normal reflexes. No cranial nerve deficit.  Skin: No rash noted.  Psychiatric: He has a normal mood and affect.          Assessment & Plan:  Health maintenance. Labs reviewed with patient. Labs stable. Hemoglobin 12.9 but normal MCV and recent normal colonoscopy and no concerning symptoms. We discussed possible repeat CBC but this was only one tenth of one point low.  Flu vaccine given. Tetanus up-to-date. Colonoscopy up to date. Refills given for Lipitor and lisinopril for one year

## 2011-12-19 ENCOUNTER — Encounter: Payer: Self-pay | Admitting: Family Medicine

## 2011-12-19 ENCOUNTER — Ambulatory Visit (INDEPENDENT_AMBULATORY_CARE_PROVIDER_SITE_OTHER): Payer: BC Managed Care – PPO | Admitting: Family Medicine

## 2011-12-19 VITALS — BP 130/80 | HR 67 | Temp 97.8°F | Wt 172.0 lb

## 2011-12-19 DIAGNOSIS — T6391XA Toxic effect of contact with unspecified venomous animal, accidental (unintentional), initial encounter: Secondary | ICD-10-CM

## 2011-12-19 DIAGNOSIS — T63481A Toxic effect of venom of other arthropod, accidental (unintentional), initial encounter: Secondary | ICD-10-CM

## 2011-12-19 MED ORDER — METHYLPREDNISOLONE ACETATE 80 MG/ML IJ SUSP
60.0000 mg | Freq: Once | INTRAMUSCULAR | Status: AC
  Administered 2011-12-19: 60 mg via INTRAMUSCULAR

## 2011-12-19 NOTE — Progress Notes (Signed)
Chief Complaint  Patient presents with  . hornet sting    on right hand on yesterday     HPI:  Hornet Sting when removing nest: -stung on R hand once -has long history of local reactions to bee stings -has tried benadryl multiple courses, hydroxyzine and coffee -denies: SOB, swelling elsewhere in body or systemic reaction, GI symptoms  ROS: See pertinent positives and negatives per HPI.  Past Medical History  Diagnosis Date  . Depression   . Hyperlipidemia   . Hypertension     Family History  Problem Relation Age of Onset  . Depression Other   . Hyperlipidemia Other   . Hypertension Other   . Cancer Other     prostate  . Stroke Other   . Heart disease Other   . Cancer Paternal Grandfather   . Heart disease Paternal Grandfather     History   Social History  . Marital Status: Single    Spouse Name: N/A    Number of Children: N/A  . Years of Education: N/A   Social History Main Topics  . Smoking status: Former Smoker    Quit date: 02/22/1995  . Smokeless tobacco: None  . Alcohol Use:   . Drug Use:   . Sexually Active:    Other Topics Concern  . None   Social History Narrative  . None    Current outpatient prescriptions:atorvastatin (LIPITOR) 20 MG tablet, Take 1 tablet (20 mg total) by mouth daily., Disp: 30 tablet, Rfl: 11;  clonazePAM (KLONOPIN) 0.5 MG disintegrating tablet, as needed. , Disp: , Rfl: ;  ibuprofen (ADVIL,MOTRIN) 800 MG tablet, TAKE 1 TABLET BY MOUTH EVERY 8 HOURS AS NEEDED FOR PAIN, Disp: 100 tablet, Rfl: 0 lisinopril (PRINIVIL,ZESTRIL) 20 MG tablet, Take 1 tablet (20 mg total) by mouth daily., Disp: 365 tablet, Rfl: 0;  sildenafil (VIAGRA) 100 MG tablet, Take 100 mg by mouth daily as needed.  , Disp: , Rfl:   EXAM:  Filed Vitals:   12/19/11 0916  BP: 130/80  Pulse: 67  Temp: 97.8 F (36.6 C)    There is no height on file to calculate BMI.  GENERAL: vitals reviewed and listed above, alert, oriented, appears well hydrated and in  no acute distress  HEENT: atraumatic, conjunttiva clear, no obvious abnormalities on inspection of external nose and ears  NECK: no obvious masses on inspection  LUNGS: clear to auscultation bilaterally, no wheezes, rales or rhonchi, good air movement  CV: HRRR, no peripheral edema  SKIN: erythema and swelling of R hand and wrist  MS: moves all extremities without noticeable abnormality  PSYCH: pleasant and cooperative, no obvious depression or anxiety  ASSESSMENT AND PLAN:  Discussed the following assessment and plan:  1. Local reaction to insect sting    -depo-medrol inj today, antihistamine, warned of antihistamine overdose potential and proper use, return precautions, no sign of infection at this time - but warned of this rare possible complication, UTD tdap  -Patient advised to return or notify a doctor immediately if symptoms worsen or persist or new concerns arise.  Patient Instructions  Take cetirizine (Zyrtec) 10 mg daily  Cool compresses to hand a few times per day  Follow up with worsening or not improving over next 3-5 days, any trouble breathing or fevers or any new symptoms develope       Aero Drummonds R.

## 2011-12-19 NOTE — Addendum Note (Signed)
Addended by: Azucena Freed on: 12/19/2011 09:50 AM   Modules accepted: Orders

## 2011-12-19 NOTE — Patient Instructions (Addendum)
Take cetirizine (Zyrtec) 10 mg daily  Cool compresses to hand a few times per day  Follow up with worsening or not improving over next 3-5 days, any trouble breathing or fevers or any new symptoms develope

## 2012-11-12 ENCOUNTER — Other Ambulatory Visit (INDEPENDENT_AMBULATORY_CARE_PROVIDER_SITE_OTHER): Payer: BC Managed Care – PPO

## 2012-11-12 DIAGNOSIS — Z0000 Encounter for general adult medical examination without abnormal findings: Secondary | ICD-10-CM

## 2012-11-12 LAB — BASIC METABOLIC PANEL
BUN: 19 mg/dL (ref 6–23)
CO2: 30 mEq/L (ref 19–32)
Calcium: 9.3 mg/dL (ref 8.4–10.5)
Chloride: 104 mEq/L (ref 96–112)
Creatinine, Ser: 0.9 mg/dL (ref 0.4–1.5)
GFR: 96.2 mL/min (ref >60.00)
Glucose, Bld: 81 mg/dL (ref 70–99)
Potassium: 4.1 mEq/L (ref 3.5–5.1)
Sodium: 139 mEq/L (ref 135–145)

## 2012-11-12 LAB — LIPID PANEL
Cholesterol: 151 mg/dL (ref 0–200)
HDL: 51.3 mg/dL (ref >39.00)
LDL Cholesterol: 85 mg/dL (ref 0–99)
Total CHOL/HDL Ratio: 3
Triglycerides: 76 mg/dL (ref 0.0–149.0)
VLDL: 15.2 mg/dL (ref 0.0–40.0)

## 2012-11-12 LAB — POCT URINALYSIS DIPSTICK
Bilirubin, UA: NEGATIVE
Blood, UA: NEGATIVE
Glucose, UA: NEGATIVE
Ketones, UA: NEGATIVE
Leukocytes, UA: NEGATIVE
Nitrite, UA: NEGATIVE
Protein, UA: NEGATIVE
Spec Grav, UA: 1.015
Urobilinogen, UA: 0.2
pH, UA: 7

## 2012-11-12 LAB — CBC WITH DIFFERENTIAL/PLATELET
Basophils Absolute: 0 10*3/uL (ref 0.0–0.1)
Basophils Relative: 0.4 % (ref 0.0–3.0)
Eosinophils Absolute: 0.2 10*3/uL (ref 0.0–0.7)
Eosinophils Relative: 1.9 % (ref 0.0–5.0)
HCT: 44.3 % (ref 39.0–52.0)
Hemoglobin: 14.9 g/dL (ref 13.0–17.0)
Lymphocytes Relative: 19.8 % (ref 12.0–46.0)
Lymphs Abs: 1.8 10*3/uL (ref 0.7–4.0)
MCHC: 33.5 g/dL (ref 30.0–36.0)
MCV: 87 fl (ref 78.0–100.0)
Monocytes Absolute: 0.9 10*3/uL (ref 0.1–1.0)
Monocytes Relative: 9.8 % (ref 3.0–12.0)
Neutro Abs: 6.1 10*3/uL (ref 1.4–7.7)
Neutrophils Relative %: 68.1 % (ref 43.0–77.0)
Platelets: 234 10*3/uL (ref 150.0–400.0)
RBC: 5.09 Mil/uL (ref 4.22–5.81)
RDW: 14 % (ref 11.5–14.6)
WBC: 9 10*3/uL (ref 4.5–10.5)

## 2012-11-12 LAB — HEPATIC FUNCTION PANEL
ALT: 25 U/L (ref 0–53)
AST: 24 U/L (ref 0–37)
Albumin: 4.4 g/dL (ref 3.5–5.2)
Alkaline Phosphatase: 63 U/L (ref 39–117)
Bilirubin, Direct: 0.1 mg/dL (ref 0.0–0.3)
Total Bilirubin: 0.8 mg/dL (ref 0.3–1.2)
Total Protein: 7.2 g/dL (ref 6.0–8.3)

## 2012-11-12 LAB — PSA: PSA: 2.43 ng/mL (ref 0.10–4.00)

## 2012-11-12 LAB — TSH: TSH: 1.06 u[IU]/mL (ref 0.35–5.50)

## 2012-11-19 ENCOUNTER — Encounter: Payer: Self-pay | Admitting: Family Medicine

## 2012-11-19 ENCOUNTER — Ambulatory Visit (INDEPENDENT_AMBULATORY_CARE_PROVIDER_SITE_OTHER): Payer: BC Managed Care – PPO | Admitting: Family Medicine

## 2012-11-19 ENCOUNTER — Telehealth: Payer: Self-pay | Admitting: Family Medicine

## 2012-11-19 VITALS — BP 112/70 | HR 76 | Temp 97.6°F | Ht 67.0 in | Wt 152.0 lb

## 2012-11-19 DIAGNOSIS — Z0000 Encounter for general adult medical examination without abnormal findings: Secondary | ICD-10-CM

## 2012-11-19 DIAGNOSIS — Z23 Encounter for immunization: Secondary | ICD-10-CM

## 2012-11-19 MED ORDER — NAPROXEN 500 MG PO TABS: 500.0000 mg | ORAL_TABLET | Freq: Two times a day (BID) | ORAL | Status: DC

## 2012-11-19 MED ORDER — ATORVASTATIN CALCIUM 20 MG PO TABS: 20.0000 mg | ORAL_TABLET | Freq: Every day | ORAL | Status: DC

## 2012-11-19 MED ORDER — LISINOPRIL 20 MG PO TABS: 20.0000 mg | ORAL_TABLET | Freq: Every day | ORAL | Status: DC

## 2012-11-19 NOTE — Progress Notes (Signed)
  Subjective:    Patient ID: Damon [name], male    DOB: [birth year], 52 y.o.   MRN: [id number]  HPI Patient seen for complete physical. He has made some dramatic lifestyle changes has lost about 18 pounds this year. Feels great overall. Has history of hypertension and hyperlipidemia remains on lisinopril and Lipitor. Tetanus up-to-date. Colonoscopy up to date. Needs flu vaccine.  Past Medical History  Diagnosis Date  . Depression   . Hyperlipidemia   . Hypertension    Past Surgical History  Procedure Laterality Date  . Tonsillectomy  1995    reports that he quit smoking about 17 years ago. He does not have any smokeless tobacco history on file. His alcohol and drug histories are not on file. family history includes Cancer in his other and paternal grandfather; Depression in his other; Heart disease in his other and paternal grandfather; Hyperlipidemia in his other; Hypertension in his other; Stroke in his other. No Known Allergies    Review of Systems  Constitutional: Negative for fever, activity change, appetite change and fatigue.  HENT: Negative for ear pain, congestion and trouble swallowing.   Eyes: Negative for pain and visual disturbance.  Respiratory: Negative for cough, shortness of breath and wheezing.   Cardiovascular: Negative for chest pain and palpitations.  Gastrointestinal: Negative for nausea, vomiting, abdominal pain, diarrhea, constipation, blood in stool, abdominal distention and rectal pain.  Genitourinary: Negative for dysuria, hematuria and testicular pain.  Musculoskeletal: Negative for joint swelling and arthralgias.  Skin: Negative for rash.  Neurological: Negative for dizziness, syncope and headaches.  Hematological: Negative for adenopathy.  Psychiatric/Behavioral: Negative for confusion and dysphoric mood.       Objective:   Physical Exam  Constitutional: He is oriented to person, place, and time. He appears well-developed and well-nourished. No  distress.  HENT:  Head: Normocephalic and atraumatic.  Right Ear: External ear normal.  Left Ear: External ear normal.  Mouth/Throat: Oropharynx is clear and moist.  Eyes: Conjunctivae and EOM are normal. Pupils are equal, round, and reactive to light.  Neck: Normal range of motion. Neck supple. No thyromegaly present.  Cardiovascular: Normal rate, regular rhythm and normal heart sounds.   No murmur heard. Pulmonary/Chest: No respiratory distress. He has no wheezes. He has no rales.  Abdominal: Soft. Bowel sounds are normal. He exhibits no distension and no mass. There is no tenderness. There is no rebound and no guarding.  Genitourinary: Rectum normal and prostate normal.  Musculoskeletal: He exhibits no edema.  Lymphadenopathy:    He has no cervical adenopathy.  Neurological: He is alert and oriented to person, place, and time. He displays normal reflexes. No cranial nerve deficit.  Skin: No rash noted.  Psychiatric: He has a normal mood and affect.          Assessment & Plan:  Complete physical. Labs reviewed and no major abnormalities. Flu vaccine given. Continue healthy lifestyle habits.Confirm if he has had prior Hep A with upcoming travel to [place].

## 2012-11-19 NOTE — Telephone Encounter (Signed)
Opened in error

## 2013-11-18 ENCOUNTER — Other Ambulatory Visit (INDEPENDENT_AMBULATORY_CARE_PROVIDER_SITE_OTHER): Payer: BC Managed Care – PPO

## 2013-11-18 DIAGNOSIS — Z0000 Encounter for general adult medical examination without abnormal findings: Secondary | ICD-10-CM

## 2013-11-18 LAB — POCT URINALYSIS DIPSTICK
Bilirubin, UA: NEGATIVE
Blood, UA: NEGATIVE
Glucose, UA: NEGATIVE
Ketones, UA: NEGATIVE
Leukocytes, UA: NEGATIVE
Nitrite, UA: NEGATIVE
Protein, UA: NEGATIVE
Spec Grav, UA: 1.01
Urobilinogen, UA: 0.2
pH, UA: 5.5

## 2013-11-18 LAB — CBC WITH DIFFERENTIAL/PLATELET
Basophils Absolute: 0 10*3/uL (ref 0.0–0.1)
Basophils Relative: 0.5 % (ref 0.0–3.0)
Eosinophils Absolute: 0.2 10*3/uL (ref 0.0–0.7)
Eosinophils Relative: 2.8 % (ref 0.0–5.0)
HCT: 44.4 % (ref 39.0–52.0)
Hemoglobin: 14.6 g/dL (ref 13.0–17.0)
Lymphocytes Relative: 23.9 % (ref 12.0–46.0)
Lymphs Abs: 2 10*3/uL (ref 0.7–4.0)
MCHC: 32.8 g/dL (ref 30.0–36.0)
MCV: 88.9 fl (ref 78.0–100.0)
Monocytes Absolute: 0.9 10*3/uL (ref 0.1–1.0)
Monocytes Relative: 10.6 % (ref 3.0–12.0)
Neutro Abs: 5.2 10*3/uL (ref 1.4–7.7)
Neutrophils Relative %: 62.2 % (ref 43.0–77.0)
Platelets: 225 10*3/uL (ref 150.0–400.0)
RBC: 4.99 Mil/uL (ref 4.22–5.81)
RDW: 13.7 % (ref 11.5–15.5)
WBC: 8.4 10*3/uL (ref 4.0–10.5)

## 2013-11-18 LAB — HEPATIC FUNCTION PANEL
ALT: 23 U/L (ref 0–53)
AST: 24 U/L (ref 0–37)
Albumin: 4.6 g/dL (ref 3.5–5.2)
Alkaline Phosphatase: 58 U/L (ref 39–117)
Bilirubin, Direct: 0.1 mg/dL (ref 0.0–0.3)
Total Bilirubin: 0.5 mg/dL (ref 0.2–1.2)
Total Protein: 7.2 g/dL (ref 6.0–8.3)

## 2013-11-18 LAB — LIPID PANEL
Cholesterol: 135 mg/dL (ref 0–200)
HDL: 40.1 mg/dL (ref >39.00)
LDL Cholesterol: 59 mg/dL (ref 0–99)
NonHDL: 94.9
Total CHOL/HDL Ratio: 3
Triglycerides: 178 mg/dL — ABNORMAL HIGH (ref 0.0–149.0)
VLDL: 35.6 mg/dL (ref 0.0–40.0)

## 2013-11-18 LAB — BASIC METABOLIC PANEL
BUN: 14 mg/dL (ref 6–23)
CO2: 28 mEq/L (ref 19–32)
Calcium: 9.3 mg/dL (ref 8.4–10.5)
Chloride: 103 mEq/L (ref 96–112)
Creatinine, Ser: 0.9 mg/dL (ref 0.4–1.5)
GFR: 99.75 mL/min (ref >60.00)
Glucose, Bld: 91 mg/dL (ref 70–99)
Potassium: 5.4 mEq/L — ABNORMAL HIGH (ref 3.5–5.1)
Sodium: 139 mEq/L (ref 135–145)

## 2013-11-18 LAB — PSA: PSA: 2.9 ng/mL (ref 0.10–4.00)

## 2013-11-18 LAB — TSH: TSH: 1.32 u[IU]/mL (ref 0.35–4.50)

## 2013-11-25 ENCOUNTER — Encounter: Payer: Self-pay | Admitting: Family Medicine

## 2013-11-25 ENCOUNTER — Encounter: Payer: BC Managed Care – PPO | Admitting: Family Medicine

## 2013-11-25 ENCOUNTER — Ambulatory Visit (INDEPENDENT_AMBULATORY_CARE_PROVIDER_SITE_OTHER): Payer: BC Managed Care – PPO | Admitting: Family Medicine

## 2013-11-25 VITALS — BP 120/80 | HR 66 | Temp 97.6°F | Ht 67.0 in | Wt 154.0 lb

## 2013-11-25 DIAGNOSIS — Z0289 Encounter for other administrative examinations: Secondary | ICD-10-CM

## 2013-11-25 DIAGNOSIS — Z23 Encounter for immunization: Secondary | ICD-10-CM

## 2013-11-25 DIAGNOSIS — Z0000 Encounter for general adult medical examination without abnormal findings: Secondary | ICD-10-CM

## 2013-11-25 MED ORDER — IBUPROFEN 800 MG PO TABS: 800.0000 mg | ORAL_TABLET | Freq: Three times a day (TID) | ORAL | Status: DC | PRN

## 2013-11-25 MED ORDER — ATORVASTATIN CALCIUM 20 MG PO TABS: 20.0000 mg | ORAL_TABLET | Freq: Every day | ORAL | Status: DC

## 2013-11-25 MED ORDER — LISINOPRIL 20 MG PO TABS: 20.0000 mg | ORAL_TABLET | Freq: Every day | ORAL | Status: DC

## 2013-11-25 NOTE — Progress Notes (Signed)
Pre visit review using our clinic review tool, if applicable. No additional management support is needed unless otherwise documented below in the visit note. 

## 2013-11-25 NOTE — Progress Notes (Signed)
   Subjective:    Patient ID: Damon [name], male    DOB: 01/23/1961, 53 y.o.   MRN: [id number]  HPI    Review of Systems     Objective:   Physical Exam        Assessment & Plan:   This encounter was created in error - please disregard.

## 2013-11-25 NOTE — Progress Notes (Signed)
   Subjective:    Patient ID: Damon [name], male    DOB: 08/02/1960, 53 y.o.   MRN: [id number]  HPI Patient seen for complete physical. He has hypertension and hyperlipidemia. He remains on atorvastatin and lisinopril. He's done an excellent job with weight control and regular exercise. He lost substantial weight last year and has maintained this year. Still needs flu vaccine.  Past Medical History  Diagnosis Date  . Depression   . Hyperlipidemia   . Hypertension    Past Surgical History  Procedure Laterality Date  . Tonsillectomy  1995    reports that he quit smoking about 18 years ago. He does not have any smokeless tobacco history on file. His alcohol and drug histories are not on file. family history includes Cancer in his other and paternal grandfather; Depression in his other; Heart disease in his other and paternal grandfather; Hyperlipidemia in his other; Hypertension in his other; Stroke in his other. No Known Allergies    Review of Systems  Constitutional: Negative for fever, activity change, appetite change and fatigue.  HENT: Negative for congestion, ear pain and trouble swallowing.   Eyes: Negative for pain and visual disturbance.  Respiratory: Negative for cough, shortness of breath and wheezing.   Cardiovascular: Negative for chest pain and palpitations.  Gastrointestinal: Negative for nausea, vomiting, abdominal pain, diarrhea, constipation, blood in stool, abdominal distention and rectal pain.  Genitourinary: Negative for dysuria, hematuria and testicular pain.  Musculoskeletal: Negative for arthralgias and joint swelling.  Skin: Negative for rash.  Neurological: Negative for dizziness, syncope and headaches.  Hematological: Negative for adenopathy.  Psychiatric/Behavioral: Negative for confusion and dysphoric mood.       Objective:   Physical Exam  Constitutional: He is oriented to person, place, and time. He appears well-developed and well-nourished. No  distress.  HENT:  Head: Normocephalic and atraumatic.  Right Ear: External ear normal.  Left Ear: External ear normal.  Mouth/Throat: Oropharynx is clear and moist.  Eyes: Conjunctivae and EOM are normal. Pupils are equal, round, and reactive to light.  Neck: Normal range of motion. Neck supple. No thyromegaly present.  Cardiovascular: Normal rate, regular rhythm and normal heart sounds.   No murmur heard. Pulmonary/Chest: No respiratory distress. He has no wheezes. He has no rales.  Abdominal: Soft. Bowel sounds are normal. He exhibits no distension and no mass. There is no tenderness. There is no rebound and no guarding.  Genitourinary:  Prostate is slightly large but no nodules. Nontender. No rectal mass  Musculoskeletal: He exhibits no edema.  Lymphadenopathy:    He has no cervical adenopathy.  Neurological: He is alert and oriented to person, place, and time. He displays normal reflexes. No cranial nerve deficit.  Skin: No rash noted.  Psychiatric: He has a normal mood and affect.          Assessment & Plan:  Complete physical. Refill medications. Labs reviewed with no major concerns. Flu vaccine given. Tetanus up-to-date. Colonoscopy up to date.

## 2014-03-20 ENCOUNTER — Ambulatory Visit (INDEPENDENT_AMBULATORY_CARE_PROVIDER_SITE_OTHER): Payer: BLUE CROSS/BLUE SHIELD | Admitting: Family Medicine

## 2014-03-20 ENCOUNTER — Encounter: Payer: Self-pay | Admitting: Family Medicine

## 2014-03-20 VITALS — BP 120/80 | HR 70 | Temp 97.5°F | Wt 153.0 lb

## 2014-03-20 DIAGNOSIS — S46111A Strain of muscle, fascia and tendon of long head of biceps, right arm, initial encounter: Secondary | ICD-10-CM

## 2014-03-20 DIAGNOSIS — S46211A Strain of muscle, fascia and tendon of other parts of biceps, right arm, initial encounter: Secondary | ICD-10-CM

## 2014-03-20 NOTE — Patient Instructions (Signed)
Biceps Tendon Disruption (Proximal) with Rehab The biceps tendon attaches the biceps muscle to the bones of the elbow and the shoulder. A proximal biceps tendon disruption is a tear of the long head of the tendon that attaches near the shoulder (more common) or a tear in the muscle near the muscle tendon junction (less common). These injuries usually involve a complete tear of the tendon from the bone. However, partial tears are also possible. The biceps muscle works with other muscles to bend the elbow and rotate the palm upward (supinate). A complete biceps rupture will result in about a 10% decrease in elbow bending strength and a 20% decrease in your ability to turn the palm upward, using the wrist. SYMPTOMS   Pain, tenderness, swelling, warmth, or redness over the front of the shoulder.  Pain that gets worse with shoulder and elbow use, especially against resistance (lifting or carrying).  Bruising (contusion) in the arm or elbow after 24 to 48 hours.  Bulge can be seen and felt in the arm.  Limited motion of the shoulder or elbow.  Weakness with attempted elbow bending or rotation of the wrist, such as when using a screwdriver.  Crackling sound (crepitation) when the tendon or shoulder is moved or touched. CAUSES  A biceps tendon rupture occurs when the tendon is subjected to a force that is greater than it can withstand. For example, a sudden force straightening the elbow while the biceps is flexed, or a direct hit (trauma) (rare). RISK INCREASES WITH:   Sports that involve contact, or throwing and overhead activities (racquet sports, gymnastics, weightlifting, bodybuilding).  Heavy labor.  Poor strength and flexibility.  Failure to warm up properly before activity. PREVENTION  Warm up and stretch properly before activity.  Maintain physical fitness:  Strength, flexibility, and endurance.  Cardiovascular fitness.  Allow your body to recover between practices and  competition.  Learn and use proper exercise technique. PROGNOSIS  If treated properly, the symptoms of a proximal biceps tendon disruption usually go away within 12 weeks of injury.  RELATED COMPLICATIONS  Longer healing time if not properly treated, or if not given enough time to heal.  Recurring symptoms, especially if activity is resumed too soon.  Weakness of elbow bending and forearm rotation.  Prolonged disability (uncommon). TREATMENT Treatment first involves the use of ice and medicine, to reduce pain and inflammation. It is also important to perform strengthening and stretching exercises and to modify activities that cause symptoms to get worse. These exercises may be performed at home or with a therapist. It is not possible to surgically fix the tendon (sew it together). However, surgery may be performed to reinsert the tendon into the arm bone. This will make the arm look normal again. Surgery is most often advised for younger, active individuals, especially those who require strength of wrist rotation.  MEDICATION  If pain medicine is needed, nonsteroidal anti-inflammatory medicines (aspirin and ibuprofen), or other minor pain relievers (acetaminophen), are often advised.  Do not take pain medicine for 7 days before surgery.  Prescription pain relievers may be given if your caregiver thinks they are needed. Use only as directed and only as much as you need.  Corticosteroid injections may be given to help reduce inflammation, but are not usually recommended for this injury. HEAT AND COLD  Cold treatment (icing) should be applied for 10 to 15 minutes every 2 to 3 hours for inflammation and pain, and immediately after activity that aggravates your symptoms. Use ice packs   or an ice massage.  Heat treatment may be used before performing stretching and strengthening activities prescribed by your caregiver, physical therapist, or athletic trainer. Use a heat pack or a warm water  soak. SEEK MEDICAL CARE IF:   Symptoms get worse or do not improve in 2 weeks, despite treatment.  New, unexplained symptoms develop. (Drugs used in treatment may produce side effects.) EXERCISES RANGE OF MOTION (ROM) AND STRETCHING EXERCISES - Biceps Tendon Disruption (Proximal) These exercises may help you when beginning to rehabilitate your injury. Your symptoms may go away with or without further involvement from your physician, physical therapist or athletic trainer. While completing these exercises, remember:   Restoring tissue flexibility helps normal motion to return to the joints. This allows healthier, less painful movement and activity.  An effective stretch should be held for at least 30 seconds.  A stretch should never be painful. You should only feel a gentle lengthening or release in the stretched tissue. STRETCH - Flexion, Standing  Stand with good posture. With an underhand grip on your right / left hand and an overhand grip on the opposite hand, grasp a broomstick or cane so that your hands are a little more than shoulder width apart.  Keeping your right / left elbow straight and shoulder muscles relaxed, push the stick with your opposite hand to raise your right / left arm in front of your body and then overhead. Raise your arm until you feel a stretch in your right / left shoulder, but before you have increased shoulder pain.  Try to avoid shrugging your right / left shoulder as your arm rises by keeping your shoulder blade tucked down and toward your mid-back spine. Hold __________ seconds.  Slowly return to the starting position. Repeat __________ times. Complete this exercise __________ times per day. STRETCH - Abduction, Supine  Stand with good posture. With an underhand grip on your right / left hand and an overhand grip on the opposite hand, grasp a broomstick or cane so that your hands are a little more than shoulder-width apart.  Keeping your right / left  elbow straight and shoulder muscles relaxed, push the stick with your opposite hand to raise your right / left arm out to the side of your body and then overhead. Raise your arm until you feel a stretch in your right / left shoulder, but before you have increased shoulder pain.  Try to avoid shrugging your right / left shoulder as your arm rises, by keeping your shoulder blade tucked down and toward your mid-back spine. Hold for __________ seconds.  Slowly return to the starting position. Repeat __________ times. Complete this exercise __________ times per day. ROM - Flexion, Active-Assisted  Lie on your back. You may bend your knees for comfort.  Grasp a broomstick or cane so your hands are about shoulder width apart. Your right / left hand should grip the end of the stick so that your hand is positioned "thumbs-up," as if you were about to shake hands.  Using your healthy arm to lead, raise your right / left arm overhead until you feel a gentle stretch in your shoulder. Hold for right / left seconds.  Use the stick to assist in returning your right / left arm to its starting position. Repeat __________ times. Complete this exercise __________ times per day.  STRETCH - Flexion, Standing   Stand facing a wall. Walk your right / left fingers up the wall until you feel a moderate stretch in your   shoulder. As your hand gets higher, you may need to step closer to the wall or use a door frame to walk through.  Try to avoid shrugging your right / left shoulder as your arm rises, by keeping your shoulder blade tucked down and toward your mid-back spine.  Hold for __________ seconds. Use your other hand, if needed, to ease out of the stretch and return to the starting position. Repeat __________ times. Complete this exercise __________ times per day.  ROM - Internal Rotation   Using underhand grips, grasp a stick behind your back with both hands.  While standing upright with good posture, slide  the stick up your back until you feel a mild stretch in the front of your shoulder.  Hold for __________ seconds. Slowly return to your starting position. Repeat __________ times. Complete this exercise __________ times per day.  STRETCH - Internal Rotation  Place your right / left hand behind your back, palm-up.  Throw a towel or belt over your opposite shoulder. Grasp the towel with your right / left hand.  While keeping an upright posture, gently pull up on the towel until you feel a stretch in the front of your right / left shoulder.  Avoid shrugging your right / left shoulder as your arm rises, by keeping your shoulder blade tucked down and toward your mid-back spine.  Hold for __________ seconds. Release the stretch by lowering your opposite hand. Repeat __________ times. Complete this exercise __________ times per day. STRETCH - Elbow Flexors   Lie on a firm bed or countertop on your back. Be sure that you are in a comfortable position which will allow you to relax your arm muscles.  Place a folded towel under your right / left upper arm, so that your elbow and shoulder are at the same height. Extend your arm; your elbow should not rest on the bed or towel.  Allow the weight of your hand to straighten your elbow. Keep your arm and chest muscles relaxed. Your caregiver may ask you to increase the intensity of your stretch by adding a small wrist or hand weight.  Hold for __________ seconds. You should feel a stretch on the inside of your elbow. Slowly return to the starting position. Repeat __________ times. Complete this exercise __________ times per day. STRENGTHENING EXERCISES - Biceps Tendon Disruption (Proximal) These exercises may help you regain your strength after your physician has discontinued your restraint in a cast or brace. They may resolve your symptoms with or without further involvement from your physician, physical therapist or athletic trainer. While completing  these exercises, remember:   Muscles can gain both the endurance and the strength needed for everyday activities through controlled exercises.  Complete these exercises as instructed by your physician, physical therapist or athletic trainer. Progress the resistance and repetitions only as guided.  You may experience muscle soreness or fatigue, but the pain or discomfort you are trying to eliminate should never worsen during these exercises. If this pain does get worse, stop and make sure you are following the directions exactly. If the pain is still present after adjustments, discontinue the exercise until you can discuss the trouble with your clinician. STRENGTH - Elbow Flexors, Isometric   Stand or sit upright on a firm surface. Place your right / left arm so that your hand is palm-up and at the height of your waist.  Place your opposite hand on top of your forearm. Gently push down as your right / left arm   resists. Push as hard as you can with both arms, without causing any pain or movement at your right / left elbow. Hold this stationary position for __________ seconds.  Gradually release the tension in both arms. Allow your muscles to relax completely before repeating. Repeat __________ times. Complete this exercise __________ times per day. STRENGTH - Shoulder Flexion, Isometric  With good posture and facing a wall, stand or sit about 4-6 inches away.  Keeping your right / left elbow straight, gently press the top of your fist into the wall. Increase the pressure gradually until you are pressing as hard as you can without shrugging your shoulder or increasing any shoulder discomfort.  Hold for __________ seconds.  Release the tension slowly. Relax your shoulder muscles completely before you the next repetition. Repeat __________ times. Complete this exercise __________ times per day.  STRENGTH - Elbow Flexors, Supinated  With good posture, stand or sit on a firm chair without  armrests. Allow your right / left arm to rest at your side with your palm facing forward.  Holding a __________weight or gripping a rubber exercise band or tubing, bring your hand toward your shoulder.  Allow your muscles to control the resistance as your hand returns to your side. Repeat __________ times. Complete this exercise __________ times per day.  STRENGTH - Elbow Flexors, Neutral  With good posture, stand or sit on a firm chair without armrests. Allow your right / left arm to rest at your side with your thumb facing forward.  Holding a __________ weight or gripping a rubber exercise band or tubing, bring your hand toward your shoulder.  Allow your muscles to control the resistance as your hand returns to your side. Repeat __________ times. Complete this exercise __________ times per day.  STRENGTH - Shoulder Flexion   Stand or sit with good posture. Grasp a __________ weight, or an exercise band or tubing, so that your hand is "thumbs-up," like when you shake hands.  Slowly lift your right / left arm as far as you can without increasing any shoulder pain. Initially, many people can only raise their hand to shoulder height.  Avoid shrugging your right / left shoulder as your arm rises, by keeping your shoulder blade tucked down and toward your mid-back spine.  Hold for __________ seconds. Control the descent of your hand as you slowly return to your starting position. Repeat __________ times. Complete this exercise __________ times per day.  STRENGTH - Forearm Supinators   Sit with your right / left forearm supported on a table, keeping your elbow below shoulder height. Rest your hand over the edge, palm down.  Gently grip a hammer or a soup ladle.  Without moving your elbow, slowly turn your palm and hand upward to a "thumbs-up" position.  Hold this position for __________ seconds. Slowly return to the starting position. Repeat __________ times. Complete this exercise  __________ times per day.  STRENGTH - Forearm Pronators   Sit with your right / left forearm supported on a table, keeping your elbow below shoulder height. Rest your hand over the edge, palm up.  Gently grip a hammer or a soup ladle.  Without moving your elbow, slowly turn your palm and hand upward to a "thumbs-up" position.  Hold this position for __________ seconds. Slowly return to the starting position. Repeat __________ times. Complete this exercise __________ times per day.  Document Released: 02/07/2005 Document Revised: 05/02/2011 Document Reviewed: 05/22/2008 [employer] Patient Information 2015 [employer]. This information is not   intended to replace advice given to you by your health care provider. Make sure you discuss any questions you have with your health care provider.  

## 2014-03-20 NOTE — Progress Notes (Signed)
Pre visit review using our clinic review tool, if applicable. No additional management support is needed unless otherwise documented below in the visit note. 

## 2014-03-20 NOTE — Progress Notes (Signed)
   Subjective:    Patient ID: Damon [name], male    DOB: 02/08/1961, 54 y.o.   MRN: [id number]  HPI Patient seen with biceps injury. Last week he was grabbing a pipe and pulling himself into a space and felt a tearing sensation. This was followed immediately by a "lump" in the right arm-biceps region. He had some mild pain. He tried some ice and ibuprofen which seemed to help. He had some bruising which is already resolving.  Past Medical History  Diagnosis Date  . Depression   . Hyperlipidemia   . Hypertension    Past Surgical History  Procedure Laterality Date  . Tonsillectomy  1995    reports that he quit smoking about 19 years ago. He does not have any smokeless tobacco history on file. His alcohol and drug histories are not on file. family history includes Cancer in his other and paternal grandfather; Depression in his other; Heart disease in his other and paternal grandfather; Hyperlipidemia in his other; Hypertension in his other; Stroke in his other. No Known Allergies    Review of Systems  Neurological: Negative for weakness and numbness.       Objective:   Physical Exam  Constitutional: He appears well-developed and well-nourished.  Cardiovascular: Normal rate and regular rhythm.   Pulmonary/Chest: Effort normal and breath sounds normal. No respiratory distress. He has no wheezes. He has no rales.  Musculoskeletal:  Patient has evidence for disruption of the proximal long head biceps tendon. Fading ecchymosis. Nontender          Assessment & Plan:  Biceps tendon rupture long head right biceps proximally. Set up orthopedic surgical referral

## 2014-03-24 ENCOUNTER — Other Ambulatory Visit: Payer: Self-pay | Admitting: Family Medicine

## 2014-03-26 ENCOUNTER — Ambulatory Visit: Payer: BLUE CROSS/BLUE SHIELD | Admitting: Sports Medicine

## 2014-05-29 NOTE — Progress Notes (Signed)
Please put orders in Epic surgery 06-12-14 pre op 06-05-14 Thanks

## 2014-06-01 ENCOUNTER — Ambulatory Visit: Payer: Self-pay | Admitting: Surgery

## 2014-06-05 ENCOUNTER — Encounter (HOSPITAL_COMMUNITY): Payer: Self-pay

## 2014-06-05 ENCOUNTER — Encounter (HOSPITAL_COMMUNITY)
Admission: RE | Admit: 2014-06-05 | Discharge: 2014-06-05 | Disposition: A | Discharge status: Home / Self Care | Payer: BLUE CROSS/BLUE SHIELD | Source: Ambulatory Visit | Attending: Surgery | Admitting: Surgery

## 2014-06-05 DIAGNOSIS — Z01812 Encounter for preprocedural laboratory examination: Secondary | ICD-10-CM | POA: Diagnosis not present

## 2014-06-05 DIAGNOSIS — Z01810 Encounter for preprocedural cardiovascular examination: Secondary | ICD-10-CM | POA: Insufficient documentation

## 2014-06-05 HISTORY — DX: Cardiac murmur, unspecified: R01.1

## 2014-06-05 HISTORY — DX: Allergy, unspecified, initial encounter: T78.40XA

## 2014-06-05 HISTORY — DX: Unspecified injury of muscle, fascia and tendon of long head of biceps, right arm, initial encounter: S46.101A

## 2014-06-05 LAB — CBC
HCT: 45.3 % (ref 39.0–52.0)
Hemoglobin: 14.7 g/dL (ref 13.0–17.0)
MCH: 29.1 pg (ref 26.0–34.0)
MCHC: 32.5 g/dL (ref 30.0–36.0)
MCV: 89.7 fL (ref 78.0–100.0)
Platelets: 248 10*3/uL (ref 150–400)
RBC: 5.05 MIL/uL (ref 4.22–5.81)
RDW: 13 % (ref 11.5–15.5)
WBC: 9 10*3/uL (ref 4.0–10.5)

## 2014-06-05 LAB — BASIC METABOLIC PANEL
Anion gap: 6 (ref 5–15)
BUN: 21 mg/dL (ref 6–23)
CO2: 29 mmol/L (ref 19–32)
Calcium: 9 mg/dL (ref 8.4–10.5)
Chloride: 103 mmol/L (ref 96–112)
Creatinine, Ser: 0.96 mg/dL (ref 0.50–1.35)
GFR calc Af Amer: >90 mL/min (ref >90)
GFR calc non Af Amer: >90 mL/min (ref >90)
Glucose, Bld: 101 mg/dL — ABNORMAL HIGH (ref 70–99)
Potassium: 4.6 mmol/L (ref 3.5–5.1)
Sodium: 138 mmol/L (ref 135–145)

## 2014-06-05 NOTE — Progress Notes (Addendum)
Pt asked to take a picture of his consent form. This nurse informed pt he would need to go through proper channels in order to obtain his medical information. That he could go to medical records to obtain copies of what he needed. Pt verbalized understanding.

## 2014-06-05 NOTE — Progress Notes (Signed)
Quick Note:  These results are acceptable for scheduled surgery.  Damon [name] M. Damon Mcclaine, MD, FACS [facility] Office: [phone]   ______ 

## 2014-06-05 NOTE — Progress Notes (Signed)
Quick Note:  These results are acceptable for scheduled surgery.  Shiro Ellerman M. Quillan Whitter, MD, FACS [facility] Office: [phone]   ______ 

## 2014-06-05 NOTE — Patient Instructions (Addendum)
20 Damon SaasHarry S [name]  06/05/2014   Your procedure is scheduled on:   Thursday June 12, 2014  [facility] and follow signs to  Short Stay Center arrive at 0730 AM.   Call this number if you have problems the morning of surgery [phone] or Presurgical Testing [phone].   Remember:  Do not eat food or drink liquids :After Midnight.  For Living Will and/or Health Care Power Attorney Forms: please provide copy for your medical record, may bring AM of surgery (forms should be already notarized-we do not provide this service).     Take these medicines the morning of surgery with A SIP OF WATER: Zyrtec if needed; Clonazepam if needed; eye gtts if needed                               You may not have any metal on your body including hair pins and piercings  Do not wear jewelry, lotions, powders, colognes or deodorant.  Men may shave.  Do not bring valuables to the hospital. [facility] IS NOT RESPONSIBLE FOR VALUABLES.  Contacts, dentures or bridgework may not be worn into surgery.     Patients discharged the day of surgery will not be allowed to drive home.  Name and phone number of your driver:Damon [name] (girlfriend)   ________________________________________________________________________  [facility] - Preparing for Surgery Before surgery, you can play an important role.  Because skin is not sterile, your skin needs to be as free of germs as possible.  You can reduce the number of germs on your skin by washing with CHG (chlorahexidine gluconate) soap before surgery.  CHG is an antiseptic cleaner which kills germs and bonds with the skin to continue killing germs even after washing. Please DO NOT use if you have an allergy to CHG or antibacterial soaps.  If your skin becomes reddened/irritated stop using the CHG and inform your nurse when you arrive at Short Stay. Do not shave (including legs and underarms) for at least 48 hours prior to the  first CHG shower.  You may shave your face/neck. Please follow these instructions carefully:  1.  Shower with CHG Soap the night before surgery and the  morning of Surgery.  2.  If you choose to wash your hair, wash your hair first as usual with your  normal  shampoo.  3.  After you shampoo, rinse your hair and body thoroughly to remove the  shampoo.                           4.  Use CHG as you would any other liquid soap.  You can apply chg directly  to the skin and wash                       Gently with a scrungie or clean washcloth.  5.  Apply the CHG Soap to your body ONLY FROM THE NECK DOWN.   Do not use on face/ open                           Wound or open sores. Avoid contact with eyes, ears mouth and genitals (private parts).                       Wash  face,  Genitals (private parts) with your normal soap.             6.  Wash thoroughly, paying special attention to the area where your surgery  will be performed.  7.  Thoroughly rinse your body with warm water from the neck down.  8.  DO NOT shower/wash with your normal soap after using and rinsing off  the CHG Soap.                9.  Pat yourself dry with a clean towel.            10.  Wear clean pajamas.            11.  Place clean sheets on your bed the night of your first shower and do not  sleep with pets. Day of Surgery : Do not apply any lotions/deodorants the morning of surgery.  Please wear clean clothes to the hospital/surgery center.  FAILURE TO FOLLOW THESE INSTRUCTIONS MAY RESULT IN THE CANCELLATION OF YOUR SURGERY PATIENT SIGNATURE_________________________________  NURSE SIGNATURE__________________________________  ________________________________________________________________________

## 2014-06-12 ENCOUNTER — Ambulatory Visit (HOSPITAL_COMMUNITY): Payer: BLUE CROSS/BLUE SHIELD | Admitting: Anesthesiology

## 2014-06-12 ENCOUNTER — Encounter (HOSPITAL_COMMUNITY)
Admission: RE | Disposition: A | Discharge status: Home / Self Care | Payer: Self-pay | Source: Ambulatory Visit | Attending: Surgery

## 2014-06-12 ENCOUNTER — Encounter (HOSPITAL_COMMUNITY): Payer: Self-pay | Admitting: Surgery

## 2014-06-12 ENCOUNTER — Ambulatory Visit (HOSPITAL_COMMUNITY)
Admission: RE | Admit: 2014-06-12 | Discharge: 2014-06-12 | Disposition: A | Discharge status: Home / Self Care | Payer: BLUE CROSS/BLUE SHIELD | Source: Ambulatory Visit | Attending: Surgery | Admitting: Surgery

## 2014-06-12 DIAGNOSIS — Z791 Long term (current) use of non-steroidal anti-inflammatories (NSAID): Secondary | ICD-10-CM | POA: Insufficient documentation

## 2014-06-12 DIAGNOSIS — K4090 Unilateral inguinal hernia, without obstruction or gangrene, not specified as recurrent: Secondary | ICD-10-CM

## 2014-06-12 DIAGNOSIS — Z87891 Personal history of nicotine dependence: Secondary | ICD-10-CM | POA: Diagnosis not present

## 2014-06-12 DIAGNOSIS — Z79899 Other long term (current) drug therapy: Secondary | ICD-10-CM | POA: Diagnosis not present

## 2014-06-12 DIAGNOSIS — I10 Essential (primary) hypertension: Secondary | ICD-10-CM | POA: Diagnosis not present

## 2014-06-12 HISTORY — PX: INSERTION OF MESH: SHX5868

## 2014-06-12 HISTORY — PX: INGUINAL HERNIA REPAIR: SHX194

## 2014-06-12 SURGERY — REPAIR, HERNIA, INGUINAL, ADULT
Anesthesia: General | Site: Groin | Laterality: Left

## 2014-06-12 MED ORDER — DEXAMETHASONE SODIUM PHOSPHATE 10 MG/ML IJ SOLN
INTRAMUSCULAR | Status: AC
  Filled 2014-06-12: qty 1

## 2014-06-12 MED ORDER — BUPIVACAINE-EPINEPHRINE (PF) 0.25% -1:200000 IJ SOLN
INTRAMUSCULAR | Status: AC
  Filled 2014-06-12: qty 30

## 2014-06-12 MED ORDER — LACTATED RINGERS IV SOLN: INTRAVENOUS | Status: DC

## 2014-06-12 MED ORDER — NEOSTIGMINE METHYLSULFATE 10 MG/10ML IV SOLN
INTRAVENOUS | Status: AC
  Filled 2014-06-12: qty 1

## 2014-06-12 MED ORDER — LACTATED RINGERS IV SOLN
INTRAVENOUS | Status: DC | PRN
  Administered 2014-06-12 (×2): via INTRAVENOUS

## 2014-06-12 MED ORDER — LIDOCAINE HCL (CARDIAC) 20 MG/ML IV SOLN
INTRAVENOUS | Status: AC
  Filled 2014-06-12: qty 5

## 2014-06-12 MED ORDER — CEFAZOLIN SODIUM-DEXTROSE 2-3 GM-% IV SOLR
INTRAVENOUS | Status: AC
  Filled 2014-06-12: qty 50

## 2014-06-12 MED ORDER — 0.9 % SODIUM CHLORIDE (POUR BTL) OPTIME
TOPICAL | Status: DC | PRN
  Administered 2014-06-12: 1000 mL

## 2014-06-12 MED ORDER — MIDAZOLAM HCL 5 MG/5ML IJ SOLN
INTRAMUSCULAR | Status: DC | PRN
  Administered 2014-06-12: 2 mg via INTRAVENOUS

## 2014-06-12 MED ORDER — OXYCODONE HCL 5 MG PO TABS: 5.0000 mg | ORAL_TABLET | Freq: Four times a day (QID) | ORAL | Status: DC | PRN

## 2014-06-12 MED ORDER — CEFAZOLIN SODIUM-DEXTROSE 2-3 GM-% IV SOLR
2.0000 g | INTRAVENOUS | Status: AC
  Administered 2014-06-12: 2 g via INTRAVENOUS

## 2014-06-12 MED ORDER — ROCURONIUM BROMIDE 100 MG/10ML IV SOLN
INTRAVENOUS | Status: DC | PRN
  Administered 2014-06-12: 30 mg via INTRAVENOUS

## 2014-06-12 MED ORDER — NEOSTIGMINE METHYLSULFATE 10 MG/10ML IV SOLN
INTRAVENOUS | Status: DC | PRN
  Administered 2014-06-12: 4 mg via INTRAVENOUS

## 2014-06-12 MED ORDER — PROPOFOL 10 MG/ML IV BOLUS
INTRAVENOUS | Status: DC | PRN
  Administered 2014-06-12: 130 mg via INTRAVENOUS

## 2014-06-12 MED ORDER — FENTANYL CITRATE (PF) 250 MCG/5ML IJ SOLN
INTRAMUSCULAR | Status: AC
  Filled 2014-06-12: qty 5

## 2014-06-12 MED ORDER — PROPOFOL 10 MG/ML IV BOLUS
INTRAVENOUS | Status: AC
  Filled 2014-06-12: qty 20

## 2014-06-12 MED ORDER — PROMETHAZINE HCL 25 MG/ML IJ SOLN: 6.2500 mg | INTRAMUSCULAR | Status: DC | PRN

## 2014-06-12 MED ORDER — MIDAZOLAM HCL 2 MG/2ML IJ SOLN
INTRAMUSCULAR | Status: AC
  Filled 2014-06-12: qty 2

## 2014-06-12 MED ORDER — FENTANYL CITRATE (PF) 100 MCG/2ML IJ SOLN
INTRAMUSCULAR | Status: DC | PRN
  Administered 2014-06-12 (×2): 50 ug via INTRAVENOUS

## 2014-06-12 MED ORDER — ONDANSETRON HCL 4 MG/2ML IJ SOLN
INTRAMUSCULAR | Status: DC | PRN
  Administered 2014-06-12: 4 mg via INTRAVENOUS

## 2014-06-12 MED ORDER — DEXAMETHASONE SODIUM PHOSPHATE 10 MG/ML IJ SOLN
INTRAMUSCULAR | Status: DC | PRN
  Administered 2014-06-12: 10 mg via INTRAVENOUS

## 2014-06-12 MED ORDER — ONDANSETRON HCL 4 MG/2ML IJ SOLN
INTRAMUSCULAR | Status: AC
  Filled 2014-06-12: qty 2

## 2014-06-12 MED ORDER — SUCCINYLCHOLINE CHLORIDE 20 MG/ML IJ SOLN
INTRAMUSCULAR | Status: DC | PRN
  Administered 2014-06-12: 100 mg via INTRAVENOUS

## 2014-06-12 MED ORDER — FENTANYL CITRATE (PF) 100 MCG/2ML IJ SOLN
25.0000 ug | INTRAMUSCULAR | Status: DC | PRN
  Administered 2014-06-12 (×2): 50 ug via INTRAVENOUS

## 2014-06-12 MED ORDER — FENTANYL CITRATE (PF) 100 MCG/2ML IJ SOLN
INTRAMUSCULAR | Status: AC
  Filled 2014-06-12: qty 2

## 2014-06-12 MED ORDER — OXYCODONE HCL 5 MG PO TABS
10.0000 mg | ORAL_TABLET | Freq: Four times a day (QID) | ORAL | Status: DC | PRN
  Administered 2014-06-12: 10 mg via ORAL
  Filled 2014-06-12: qty 2

## 2014-06-12 MED ORDER — LACTATED RINGERS IV SOLN
INTRAVENOUS | Status: DC
  Administered 2014-06-12: 1000 mL via INTRAVENOUS

## 2014-06-12 MED ORDER — GLYCOPYRROLATE 0.2 MG/ML IJ SOLN
INTRAMUSCULAR | Status: AC
  Filled 2014-06-12: qty 3

## 2014-06-12 MED ORDER — MEPERIDINE HCL 50 MG/ML IJ SOLN: 6.2500 mg | INTRAMUSCULAR | Status: DC | PRN

## 2014-06-12 MED ORDER — LIDOCAINE HCL (CARDIAC) 20 MG/ML IV SOLN
INTRAVENOUS | Status: DC | PRN
  Administered 2014-06-12: 100 mg via INTRAVENOUS

## 2014-06-12 MED ORDER — ROCURONIUM BROMIDE 100 MG/10ML IV SOLN
INTRAVENOUS | Status: AC
  Filled 2014-06-12: qty 1

## 2014-06-12 MED ORDER — GLYCOPYRROLATE 0.2 MG/ML IJ SOLN
INTRAMUSCULAR | Status: DC | PRN
  Administered 2014-06-12: 0.6 mg via INTRAVENOUS

## 2014-06-12 MED ORDER — BUPIVACAINE-EPINEPHRINE (PF) 0.25% -1:200000 IJ SOLN
INTRAMUSCULAR | Status: DC | PRN
  Administered 2014-06-12: 20 mL

## 2014-06-12 SURGICAL SUPPLY — 39 items
APL SKNCLS STERI-STRIP NONHPOA (GAUZE/BANDAGES/DRESSINGS) ×1
APPLICATOR COTTON TIP 6IN STRL (MISCELLANEOUS) ×1 IMPLANT
BENZOIN TINCTURE PRP APPL 2/3 (GAUZE/BANDAGES/DRESSINGS) ×2 IMPLANT
BLADE HEX COATED 2.75 (ELECTRODE) ×2 IMPLANT
BLADE SURG 15 STRL LF DISP TIS (BLADE) ×1 IMPLANT
BLADE SURG 15 STRL SS (BLADE) ×2
BLADE SURG SZ10 CARB STEEL (BLADE) IMPLANT
DECANTER SPIKE VIAL GLASS SM (MISCELLANEOUS) ×1 IMPLANT
DRAIN PENROSE 18X1/2 LTX STRL (DRAIN) ×2 IMPLANT
DRAPE LAPAROTOMY TRNSV 102X78 (DRAPE) ×2 IMPLANT
ELECT REM PT RETURN 9FT ADLT (ELECTROSURGICAL) ×2
ELECTRODE REM PT RTRN 9FT ADLT (ELECTROSURGICAL) ×1 IMPLANT
GAUZE SPONGE 4X4 12PLY STRL (GAUZE/BANDAGES/DRESSINGS) ×1 IMPLANT
GLOVE BIOGEL PI IND STRL 7.0 (GLOVE) ×1 IMPLANT
GLOVE BIOGEL PI IND STRL 7.5 (GLOVE) IMPLANT
GLOVE BIOGEL PI INDICATOR 7.0 (GLOVE) ×2
GLOVE BIOGEL PI INDICATOR 7.5 (GLOVE) ×1
GLOVE SURG ORTHO 8.0 STRL STRW (GLOVE) ×2 IMPLANT
GLOVE SURG SS PI 6.5 STRL IVOR (GLOVE) ×1 IMPLANT
GLOVE SURG SS PI 7.0 STRL IVOR (GLOVE) ×1 IMPLANT
GOWN STRL REUS W/TWL LRG LVL3 (GOWN DISPOSABLE) ×2 IMPLANT
GOWN STRL REUS W/TWL XL LVL3 (GOWN DISPOSABLE) ×4 IMPLANT
KIT BASIN OR (CUSTOM PROCEDURE TRAY) ×2 IMPLANT
MESH ULTRAPRO 3X6 7.6X15CM (Mesh General) ×1 IMPLANT
NDL HYPO 25X1 1.5 SAFETY (NEEDLE) ×1 IMPLANT
NEEDLE HYPO 25X1 1.5 SAFETY (NEEDLE) ×2 IMPLANT
NS IRRIG 1000ML POUR BTL (IV SOLUTION) ×2 IMPLANT
PACK BASIC VI WITH GOWN DISP (CUSTOM PROCEDURE TRAY) ×2 IMPLANT
PENCIL BUTTON HOLSTER BLD 10FT (ELECTRODE) ×2 IMPLANT
SPONGE LAP 4X18 X RAY DECT (DISPOSABLE) ×4 IMPLANT
STRIP CLOSURE SKIN 1/2X4 (GAUZE/BANDAGES/DRESSINGS) ×2 IMPLANT
SUT MNCRL AB 4-0 PS2 18 (SUTURE) ×2 IMPLANT
SUT NOVA NAB GS-22 2 0 T19 (SUTURE) ×6 IMPLANT
SUT SILK 2 0 SH (SUTURE) ×3 IMPLANT
SUT VIC AB 3-0 SH 18 (SUTURE) ×2 IMPLANT
SYR BULB IRRIGATION 50ML (SYRINGE) ×2 IMPLANT
SYR CONTROL 10ML LL (SYRINGE) ×2 IMPLANT
TOWEL OR 17X26 10 PK STRL BLUE (TOWEL DISPOSABLE) ×2 IMPLANT
YANKAUER SUCT BULB TIP 10FT TU (MISCELLANEOUS) ×2 IMPLANT

## 2014-06-12 NOTE — Anesthesia Postprocedure Evaluation (Signed)
  Anesthesia Post-op Note  Patient: Damon [name]  Procedure(s) Performed: Procedure(s) (LRB): LEFT INGUINAL HERNIA REPAIR WITH MESH (Left) INSERTION OF MESH (Left)  Patient Location: PACU  Anesthesia Type: General  Level of Consciousness: awake and alert   Airway and Oxygen Therapy: Patient Spontanous Breathing  Post-op Pain: mild  Post-op Assessment: Post-op Vital signs reviewed, Patient's Cardiovascular Status Stable, Respiratory Function Stable, Patent Airway and No signs of Nausea or vomiting  Last Vitals:  Filed Vitals:   06/12/14 1327  BP: 111/74  Pulse: 60  Temp:   Resp: 16    Post-op Vital Signs: stable   Complications: No apparent anesthesia complications

## 2014-06-12 NOTE — Op Note (Signed)
Inguinal Hernia, Open, Procedure Note  Pre-operative Diagnosis:  Left inguinal hernia, reducible  Post-operative Diagnosis: same  Surgeon:  Velora Hecklerodd M. Iasiah Ozment, MD, FACS  Anesthesia:  General  Preparation:  Chlora-prep  Estimated Blood Loss: Minimal  Complications:  none  Indications: The patient presented with a left, reducible hernia.  The patient is a 54 year old male who presents with an inguinal hernia. Patient is referred by Dr. Melvia Heapsobert Kaplan for evaluation of new inguinal hernia. Patient first noted symptoms within the past few weeks. He has not had any significant pain but has had minor discomfort. Hernia has always been reducible. He has had no signs or symptoms of obstruction. He is having some minor bladder symptoms.   Procedure Details  The patient was evaluated in the holding area. All of the patient's questions were answered and the proposed procedure was confirmed. The site of the procedure was properly marked. The patient was taken to the Operating Room, identified by name, and the procedure verified as inguinal hernia repair.  The patient was placed in the supine position and underwent induction of anesthesia. A "Time Out" was performed per routine. The lower abdomen and groin were prepped and draped in the usual aseptic fashion.  After ascertaining that an adequate level of anesthesia had been obtained, an incision was made in the groin with a #10 blade.  Dissection was carried through the subcutaneous tissues and hemostasis obtained with the electrocautery.  A Gelpi retractor was placed for exposure.  The external oblique fascia was incised in line with it's fibers and extended through the external inguinal ring.  The cord structures were dissected out of the inguinal canal and encircled with a Penrose drain.  The floor of the inguinal canal was dissected out.  There was slight laxity but no direct defect.  The cord was explored and there was a moderate sized indirect  inguinal hernia sac.  The sac was dissected to the level of the internal ring and a high ligation was performed with 2-0 silk and sac was excised and discarded.  The floor of the inguinal canal was reconstructed with Ethicon Ultrapro mesh cut to the appropriate dimensions.  It was secured to the pubic tubercle with a 2-0 Novafil suture and along the inguinal ligament with a running 2-0 Novafil suture.  Mesh was split to accommodate the cord structures.  The superior margin of the mesh was secured to the transversalis and internal oblique musculature with interrupted 2-0 Novafil sutures.  The tails of the mesh were overlapped lateral to the cord structures and secured to the inguinal ligament with interrupted 2-0 Novafil sutures to recreate the internal inguinal ring.  Cord structures were returned to the inguinal canal.  Local anesthetic was infiltrated throughout the field.  External oblique fascia was closed with interrupted 3-0 Vicryl sutures.  Subcutaneous tissues were closed with interrupted 3-0 Vicryl sutures.  Skin was anesthetized with local anesthetic, and the skin edges were re-approximated with a running 4-0 Monocryl suture.  Wound was washed and dried and benzoin and steristrips were applied.  A gauze dressing was applied.  Instrument, sponge, and needle counts were correct prior to closure and at the conclusion of the case.  The patient tolerated the procedure well.  The patient was awakened from anesthesia and brought to the recovery room in stable condition.  Velora Hecklerodd M. Armari Fussell, MD, [facility] Office: [phone]

## 2014-06-12 NOTE — Progress Notes (Addendum)
Patient up ambulating in hallway after left inguinal hernia repair.  He tolerated ambulation well.Unable to urinate at this time. He does not feel pressure of bladder and he is instructed to slow down his oral intake as not to overdistend his bladder. His wife filled pain medication at out patient pharmacy.

## 2014-06-12 NOTE — Discharge Instructions (Signed)

## 2014-06-12 NOTE — H&P (Signed)
General Surgery [facility]  Suzette BattiestHarry S. Alberteen SpindleFalk 05/20/2014 1:53 PM Location: [facility] Patient #: [id number] DOB: 04/08/1960 Undefined / Language: [place] / Race: White Male  History of Present Illness Damon [name](Erandi Lemma M. Ossie Beltran MD; 05/20/2014 2:54 PM) Patient words: hernia.  The patient is a 54 year old male who presents with an inguinal hernia. Patient is referred by Dr. Melvia Heapsobert Kaplan for evaluation of new inguinal hernia. Patient first noted symptoms within the past few weeks. He has not had any significant pain but has had minor discomfort. Hernia has always been reducible. He has had no signs or symptoms of obstruction. He is having some minor bladder symptoms. Previous abdominal surgery includes appendectomy performed by Dr. Orpah Melterobert Farley. Patient presents today to discuss elective repair of his left inguinal hernia.   Other Problems Damon [name](Ashley Beck, CMA; 05/20/2014 1:54 PM) No pertinent past medical history  Past Surgical History Damon [name](Ashley Beck, CMA; 05/20/2014 1:54 PM) Appendectomy Tonsillectomy  Diagnostic Studies History Damon [name](Ashley Beck, CMA; 05/20/2014 1:54 PM) Colonoscopy 1-5 years ago  Allergies Damon [name](Ashley Beck, CMA; 05/20/2014 1:55 PM) No Known Drug Allergies03/29/2016  Medication History Damon [name](Ashley Beck, CMA; 05/20/2014 1:56 PM) Lipitor (20MG  Tablet, Oral) Active. KlonoPIN (0.5MG  Tablet, Oral) Active. Ibuprofen (800MG  Tablet, Oral) Active. Lisinopril (20MG  Tablet, Oral) Active. Medications Reconciled  Review of Systems Damon [name](Ashley Beck CMA; 05/20/2014 1:54 PM) General Present- Appetite Loss and Weight Loss. Not Present- Chills, Fatigue, Fever, Night Sweats and Weight Gain. Skin Not Present- Change in Wart/Mole, Dryness, Hives, Jaundice, New Lesions, Non-Healing Wounds, Rash and Ulcer. HEENT Not Present- Earache, Hearing Loss, Hoarseness, Nose Bleed, Oral Ulcers, Ringing in the Ears, Seasonal Allergies, Sinus Pain, Sore Throat, Visual Disturbances, Wears  glasses/contact lenses and Yellow Eyes. Respiratory Not Present- Bloody sputum, Chronic Cough, Difficulty Breathing, Snoring and Wheezing. Breast Not Present- Breast Mass, Breast Pain, Nipple Discharge and Skin Changes. Cardiovascular Not Present- Chest Pain, Difficulty Breathing Lying Down, Leg Cramps, Palpitations, Rapid Heart Rate, Shortness of Breath and Swelling of Extremities. Gastrointestinal Present- Abdominal Pain and Excessive gas. Not Present- Bloating, Bloody Stool, Change in Bowel Habits, Chronic diarrhea, Constipation, Difficulty Swallowing, Gets full quickly at meals, Hemorrhoids, Indigestion, Nausea, Rectal Pain and Vomiting. Musculoskeletal Not Present- Back Pain, Joint Pain, Joint Stiffness, Muscle Pain, Muscle Weakness and Swelling of Extremities. Neurological Not Present- Decreased Memory, Fainting, Headaches, Numbness, Seizures, Tingling, Tremor, Trouble walking and Weakness. Psychiatric Present- Change in Sleep Pattern. Not Present- Anxiety, Bipolar, Depression, Fearful and Frequent crying. Endocrine Not Present- Cold Intolerance, Excessive Hunger, Hair Changes, Heat Intolerance, Hot flashes and New Diabetes.   Vitals Damon [name](Ashley Beck CMA; 05/20/2014 1:57 PM) 05/20/2014 1:56 PM Weight: 153 lb Height: 69in Body Surface Area: 1.84 m Body Mass Index: 22.59 kg/m Temp.: 97.59F(Temporal)  Pulse: 80 (Regular)  Resp.: 17 (Unlabored)  BP: 130/70 (Sitting, Left Arm, Standard)    Physical Exam Damon [name](Faylynn Stamos M. Yitzel Shasteen MD; 05/20/2014 2:55 PM)  General - appears comfortable, no distress; not diaphorectic  HEENT - normocephalic; sclerae clear, gaze conjugate; mucous membranes moist, dentition good; voice normal  Neck - symmetric on extension; no palpable anterior or posterior cervical adenopathy; no palpable masses in the thyroid bed  Chest - clear bilaterally with rhonchi, rales, or wheeze  Cor - regular rhythm with normal rate; no significant murmur  Abd - soft without  distension; no sign of umbilical hernia  GU - normal male genitalia without mass or lesion; palpation in the right inguinal canal with cough and Valsalva shows no sign of hernia; palpation in the left inguinal  canal with cough and Valsalva shows an obvious small to moderate inguinal hernia which is minimally tender and easily reducible; hernia prolapses with cough or Valsalva  Ext - non-tender without significant edema or lymphedema  Neuro - grossly intact; no tremor    Assessment & Plan Damon Heckler MD; 05/20/2014 2:56 PM) REDUCIBLE LEFT INGUINAL HERNIA (550.90  K40.90)  Patient has a left inguinal hernia which is minimally symptomatic. I have given him written literature on hernia repair to review at home.  I have recommended open left inguinal hernia repair with mesh. Patient I have discussed the procedure. We have discussed her restrictions on his activities after surgery. We have discussed the potential for recurrence. Patient understands and wishes to proceed with surgery in the near future. We will make arrangements for outpatient surgery at a time convenient for the patient.  The risks and benefits of the procedure have been discussed at length with the patient. The patient understands the proposed procedure, potential alternative treatments, and the course of recovery to be expected. All of the patient's questions have been answered at this time. The patient wishes to proceed with surgery.  Damon Heckler, MD, [facility] Office: [phone]

## 2014-06-12 NOTE — Anesthesia Preprocedure Evaluation (Addendum)
Anesthesia Evaluation  Patient identified by MRN, date of birth, ID band Patient awake    Reviewed: Allergy & Precautions, NPO status , Patient's Chart, lab work & pertinent test results  Airway Mallampati: II  TM Distance: >3 FB Neck ROM: Full    Dental no notable dental hx.    Pulmonary neg pulmonary ROS, former smoker,  breath sounds clear to auscultation  Pulmonary exam normal       Cardiovascular hypertension, Pt. on medications Rhythm:Regular Rate:Normal     Neuro/Psych negative neurological ROS  negative psych ROS   GI/Hepatic negative GI ROS, Neg liver ROS,   Endo/Other  negative endocrine ROS  Renal/GU negative Renal ROS  negative genitourinary   Musculoskeletal negative musculoskeletal ROS (+)   Abdominal   Peds negative pediatric ROS (+)  Hematology negative hematology ROS (+)   Anesthesia Other Findings   Reproductive/Obstetrics negative OB ROS                           Anesthesia Physical Anesthesia Plan  ASA: II  Anesthesia Plan: General   Post-op Pain Management:    Induction: Intravenous  Airway Management Planned: Oral ETT and LMA  Additional Equipment:   Intra-op Plan:   Post-operative Plan: Extubation in OR  Informed Consent: I have reviewed the patients History and Physical, chart, labs and discussed the procedure including the risks, benefits and alternatives for the proposed anesthesia with the patient or authorized representative who has indicated his/her understanding and acceptance.   Dental advisory given  Plan Discussed with: CRNA  Anesthesia Plan Comments:        Anesthesia Quick Evaluation

## 2014-06-12 NOTE — Transfer of Care (Signed)
Immediate Anesthesia Transfer of Care Note  Patient: Damon [name]  Procedure(s) Performed: Procedure(s): LEFT INGUINAL HERNIA REPAIR WITH MESH (Left) INSERTION OF MESH (Left)  Patient Location: PACU  Anesthesia Type:General  Level of Consciousness: sedated  Airway & Oxygen Therapy: Patient Spontanous Breathing and Patient connected to face mask oxygen  Post-op Assessment: Report given to RN and Post -op Vital signs reviewed and stable  Post vital signs: Reviewed and stable  Last Vitals:  Filed Vitals:   06/12/14 0716  BP: 119/68  Pulse: 72  Temp: 36.5 C  Resp: 18    Complications: No apparent anesthesia complications

## 2014-06-13 ENCOUNTER — Encounter (HOSPITAL_COMMUNITY): Payer: Self-pay | Admitting: Surgery

## 2014-07-16 ENCOUNTER — Ambulatory Visit (INDEPENDENT_AMBULATORY_CARE_PROVIDER_SITE_OTHER): Payer: BLUE CROSS/BLUE SHIELD | Admitting: Sports Medicine

## 2014-07-16 ENCOUNTER — Encounter: Payer: Self-pay | Admitting: Sports Medicine

## 2014-07-16 VITALS — Ht 69.0 in | Wt 159.0 lb

## 2014-07-16 DIAGNOSIS — S46211D Strain of muscle, fascia and tendon of other parts of biceps, right arm, subsequent encounter: Secondary | ICD-10-CM

## 2014-07-16 DIAGNOSIS — K4090 Unilateral inguinal hernia, without obstruction or gangrene, not specified as recurrent: Secondary | ICD-10-CM

## 2014-07-16 DIAGNOSIS — S46111D Strain of muscle, fascia and tendon of long head of biceps, right arm, subsequent encounter: Secondary | ICD-10-CM | POA: Diagnosis not present

## 2014-07-16 DIAGNOSIS — S46119A Strain of muscle, fascia and tendon of long head of biceps, unspecified arm, initial encounter: Secondary | ICD-10-CM | POA: Insufficient documentation

## 2014-07-16 DIAGNOSIS — S46111A Strain of muscle, fascia and tendon of long head of biceps, right arm, initial encounter: Secondary | ICD-10-CM

## 2014-07-16 NOTE — Assessment & Plan Note (Signed)
Do some maintanence biceps exercises  Use a compression sleeve when doing a lot of lifting on RT  Reck if any pain

## 2014-07-16 NOTE — Patient Instructions (Addendum)
Biceps exercises/ 5 lb weight/ 3 sets of 15 Curls  Forearm rolls Forehand swings  Abdominal isometric exercises Keep tone up Add some crunches  Knees - Avoid bending too deep and puttign pressure on them w kneeling Overall knees look good

## 2014-07-16 NOTE — Progress Notes (Signed)
Patient ID: Damon SaasHarry S [name], male   DOB: 07/23/1960, 54 y.o.   MRN: [id number]  Patient enters w 3 concerns Long head of biceps rupture in Jan. Lifts a lot in his daily work Concerned about strength long term  Left inguinal hernia Wants advice about return to activities Has healed well No pain  Gets some clicking but not much pain on both knees Only over anterior  Exam NAD Ht 5\' 9"  (1.753 m)  Wt 159 lb (72.122 kg)  BMI 23.47 kg/m2  Popeye deformity of RT biceps Strength is good No pain on palpation Neg speeds/ yergasons  Bilat knees show mild retropatellar crepitation Remainder of knee exam is unremarkable  Hernia scar on left has healed well No pain with abd crunch/ no bulging No pain with SLR

## 2014-11-03 ENCOUNTER — Other Ambulatory Visit: Payer: Self-pay | Admitting: Family Medicine

## 2014-11-03 ENCOUNTER — Telehealth: Payer: Self-pay | Admitting: Family Medicine

## 2014-11-03 NOTE — Telephone Encounter (Signed)
Pt states he hurt his back this weekend and would liek a refill for naproxen (NAPROSYN) 500 MG tablet   Pt has not had this med in several yrs, but hopes dr will let him have. Today if possible  [place]

## 2014-11-03 NOTE — Telephone Encounter (Signed)
He has taken Motrin 800 mg 1 every 8 hours in the past. Okay to refill

## 2014-11-03 NOTE — Telephone Encounter (Signed)
  is currently on patient med list is it okay to refill.

## 2014-11-04 ENCOUNTER — Encounter: Payer: Self-pay | Admitting: Gastroenterology

## 2014-11-04 MED ORDER — IBUPROFEN 800 MG PO TABS: ORAL_TABLET | ORAL | Status: DC

## 2014-11-04 NOTE — Telephone Encounter (Signed)
Rx sent to pharmacy   

## 2014-11-21 ENCOUNTER — Other Ambulatory Visit: Payer: BLUE CROSS/BLUE SHIELD

## 2014-11-26 ENCOUNTER — Other Ambulatory Visit: Payer: Self-pay

## 2014-11-26 ENCOUNTER — Encounter (HOSPITAL_COMMUNITY): Payer: Self-pay | Admitting: *Deleted

## 2014-11-26 ENCOUNTER — Telehealth: Payer: Self-pay | Admitting: Gastroenterology

## 2014-11-26 DIAGNOSIS — Z1211 Encounter for screening for malignant neoplasm of colon: Secondary | ICD-10-CM

## 2014-11-26 MED ORDER — NA SULFATE-K SULFATE-MG SULF 17.5-3.13-1.6 GM/177ML PO SOLN: ORAL | Status: DC

## 2014-11-26 NOTE — Telephone Encounter (Signed)
Patient declines a pre-visit appointment. Discussed the colonoscopy. He says he is familiar with the procedure and it's risks. Discussed the prep. Instructions faxed to him. Questions invited and answered. Encouraged to call back if he has further concerns or questions. Suprep Rx sent to [facility].

## 2014-11-26 NOTE — Telephone Encounter (Signed)
Spoke with the patient about an appointment on Friday for his colonoscopy. He will call back and confirm asap. He will ask to speak with me.

## 2014-11-26 NOTE — Telephone Encounter (Signed)
Got rejection on Suprep Kit called patient to come pick up Free Suprep Kit he will pick up tomorrow

## 2014-11-27 ENCOUNTER — Encounter: Payer: BLUE CROSS/BLUE SHIELD | Admitting: Family Medicine

## 2014-11-27 NOTE — Anesthesia Preprocedure Evaluation (Addendum)
Anesthesia Evaluation  Patient identified by MRN, date of birth, ID band Patient awake    Reviewed: Allergy & Precautions, NPO status , Patient's Chart, lab work & pertinent test results  Airway Mallampati: II  TM Distance: >3 FB Neck ROM: Full    Dental no notable dental hx. (+) Dental Advisory Given   Pulmonary neg pulmonary ROS, former smoker,    Pulmonary exam normal breath sounds clear to auscultation       Cardiovascular hypertension, Pt. on medications Normal cardiovascular exam+ Valvular Problems/Murmurs  Rhythm:Regular Rate:Normal     Neuro/Psych PSYCHIATRIC DISORDERS Anxiety Depression negative neurological ROS     GI/Hepatic negative GI ROS, Neg liver ROS,   Endo/Other  negative endocrine ROS  Renal/GU negative Renal ROS  negative genitourinary   Musculoskeletal negative musculoskeletal ROS (+)   Abdominal   Peds negative pediatric ROS (+)  Hematology negative hematology ROS (+)   Anesthesia Other Findings   Reproductive/Obstetrics negative OB ROS                            Anesthesia Physical Anesthesia Plan  ASA: II  Anesthesia Plan: MAC   Post-op Pain Management:    Induction: Intravenous  Airway Management Planned: Nasal Cannula  Additional Equipment:   Intra-op Plan:   Post-operative Plan:   Informed Consent: I have reviewed the patients History and Physical, chart, labs and discussed the procedure including the risks, benefits and alternatives for the proposed anesthesia with the patient or authorized representative who has indicated his/her understanding and acceptance.   Dental advisory given  Plan Discussed with:   Anesthesia Plan Comments:        Anesthesia Quick Evaluation

## 2014-11-28 ENCOUNTER — Encounter (HOSPITAL_COMMUNITY)
Admission: RE | Disposition: A | Discharge status: Home / Self Care | Payer: Self-pay | Source: Ambulatory Visit | Attending: Gastroenterology

## 2014-11-28 ENCOUNTER — Encounter (HOSPITAL_COMMUNITY): Payer: Self-pay

## 2014-11-28 ENCOUNTER — Ambulatory Visit (HOSPITAL_COMMUNITY): Payer: BLUE CROSS/BLUE SHIELD | Admitting: Anesthesiology

## 2014-11-28 ENCOUNTER — Ambulatory Visit (HOSPITAL_COMMUNITY)
Admission: RE | Admit: 2014-11-28 | Discharge: 2014-11-28 | Disposition: A | Discharge status: Home / Self Care | Payer: BLUE CROSS/BLUE SHIELD | Source: Ambulatory Visit | Attending: Gastroenterology | Admitting: Gastroenterology

## 2014-11-28 DIAGNOSIS — E785 Hyperlipidemia, unspecified: Secondary | ICD-10-CM | POA: Insufficient documentation

## 2014-11-28 DIAGNOSIS — Z87891 Personal history of nicotine dependence: Secondary | ICD-10-CM | POA: Diagnosis not present

## 2014-11-28 DIAGNOSIS — Z86010 Personal history of colonic polyps: Secondary | ICD-10-CM | POA: Insufficient documentation

## 2014-11-28 DIAGNOSIS — I10 Essential (primary) hypertension: Secondary | ICD-10-CM | POA: Diagnosis not present

## 2014-11-28 DIAGNOSIS — Z1211 Encounter for screening for malignant neoplasm of colon: Secondary | ICD-10-CM | POA: Diagnosis not present

## 2014-11-28 HISTORY — PX: COLONOSCOPY: SHX5424

## 2014-11-28 SURGERY — COLONOSCOPY
Anesthesia: Monitor Anesthesia Care

## 2014-11-28 MED ORDER — PROPOFOL 500 MG/50ML IV EMUL
INTRAVENOUS | Status: DC | PRN
  Administered 2014-11-28: 300 ug/kg/min via INTRAVENOUS

## 2014-11-28 MED ORDER — SODIUM CHLORIDE 0.9 % IV SOLN: INTRAVENOUS | Status: DC

## 2014-11-28 MED ORDER — LACTATED RINGERS IV SOLN
INTRAVENOUS | Status: DC
  Administered 2014-11-28: 11:00:00 via INTRAVENOUS
  Administered 2014-11-28: 1000 mL via INTRAVENOUS

## 2014-11-28 MED ORDER — PROPOFOL 500 MG/50ML IV EMUL
INTRAVENOUS | Status: DC | PRN
  Administered 2014-11-28: 40 mg via INTRAVENOUS

## 2014-11-28 MED ORDER — PROPOFOL 10 MG/ML IV BOLUS
INTRAVENOUS | Status: AC
  Filled 2014-11-28: qty 20

## 2014-11-28 NOTE — Discharge Instructions (Signed)
Colonoscopy, Care After °Refer to this sheet in the next few weeks. These instructions provide you with information on caring for yourself after your procedure. Your health care provider may also give you more specific instructions. Your treatment has been planned according to current medical practices, but problems sometimes occur. Call your health care provider if you have any problems or questions after your procedure. °WHAT TO EXPECT AFTER THE PROCEDURE  °After your procedure, it is typical to have the following: °· A small amount of blood in your stool. °· Moderate amounts of gas and mild abdominal cramping or bloating. °HOME CARE INSTRUCTIONS °· Do not drive, operate machinery, or sign important documents for 24 hours. °· You may shower and resume your regular physical activities, but move at a slower pace for the first 24 hours. °· Take frequent rest periods for the first 24 hours. °· Walk around or put a warm pack on your abdomen to help reduce abdominal cramping and bloating. °· Drink enough fluids to keep your urine clear or pale yellow. °· You may resume your normal diet as instructed by your health care provider. Avoid heavy or fried foods that are hard to digest. °· Avoid drinking alcohol for 24 hours or as instructed by your health care provider. °· Only take over-the-counter or prescription medicines as directed by your health care provider. °· If a tissue sample (biopsy) was taken during your procedure: °¨ Do not take aspirin or blood thinners for 7 days, or as instructed by your health care provider. °¨ Do not drink alcohol for 7 days, or as instructed by your health care provider. °¨ Eat soft foods for the first 24 hours. °SEEK MEDICAL CARE IF: °You have persistent spotting of blood in your stool 2-3 days after the procedure. °SEEK IMMEDIATE MEDICAL CARE IF: °· You have more than a small spotting of blood in your stool. °· You pass large blood clots in your stool. °· Your abdomen is swollen  (distended). °· You have nausea or vomiting. °· You have a fever. °· You have increasing abdominal pain that is not relieved with medicine. °  °This information is not intended to replace advice given to you by your health care provider. Make sure you discuss any questions you have with your health care provider. °  °Document Released: 09/22/2003 Document Revised: 11/28/2012 Document Reviewed: 10/15/2012 °Elsevier Interactive Patient Education ©2016 [employer] ° °

## 2014-11-28 NOTE — H&P (Signed)
_                                                                                                                History of Present Illness:  Damon [name] is a 54 year old white male with history of an adenomatous polyp removed 5 years ago here for follow-up colonoscopy.  He has no GI complaints.   Past Medical History  Diagnosis Date  . Hyperlipidemia   . Hypertension   . Allergy     seasonal   . Heart murmur     hx of in childhood   . Depression     situational   . Injury of tendon of long head of right biceps     January 2016   Past Surgical History  Procedure Laterality Date  . Tonsillectomy  1995  . Appendectomy    . Tonsillectomy    . Mouth surgery      wisdom teeth removed;root canal - 03/2014  . Colonscopy       removed polyps  . Inguinal hernia repair Left 06/12/2014    Procedure: LEFT INGUINAL HERNIA REPAIR WITH MESH;  Surgeon: Darnell Level, MD;  Location: [facility];  Service: General;  Laterality: Left;  . Insertion of mesh Left 06/12/2014    Procedure: INSERTION OF MESH;  Surgeon: Darnell Level, MD;  Location: [facility];  Service: General;  Laterality: Left;   family history includes Cancer in his other and paternal grandfather; Depression in his other; Heart disease in his other and paternal grandfather; Hyperlipidemia in his other; Hypertension in his other; Stroke in his other. Current Facility-Administered Medications  Medication Dose Route Frequency Provider Last Rate Last Dose  . 0.9 %  sodium chloride infusion   Intravenous Continuous Louis Meckel, MD      . lactated ringers infusion   Intravenous Continuous Louis Meckel, MD 125 mL/hr at 11/28/14 1013     Allergies as of 11/26/2014 - Review Complete 11/26/2014  Allergen Reaction Noted  . Poison ivy extract [extract of poison ivy] Itching and Rash 05/30/2014  . Poison oak extract [extract of poison oak] Itching and Rash 05/30/2014    reports that he quit smoking about 19 years ago. His smoking  use included Cigarettes. He has a 34.5 pack-year smoking history. He has never used smokeless tobacco. He reports that he drinks alcohol. He reports that he does not use illicit drugs.   Review of Systems: Pertinent positive and negative review of systems were noted in the above HPI section. All other review of systems were otherwise negative.  Vital signs were reviewed in today's medical record Physical Exam: General: Well developed , well nourished, no acute distress Skin: anicteric Head: Normocephalic and atraumatic Eyes:  sclerae anicteric, EOMI Ears: Normal auditory acuity Mouth: No deformity or lesions Neck: Supple, no masses or thyromegaly Lymph Nodes: no lymphadenopathy Lungs: Clear throughout to auscultation Heart: Regular rate and rhythm; no murmurs, rubs or bruits Gastroinestinal:  Soft, non tender and non distended. No masses, hepatosplenomegaly or hernias noted. Normal Bowel sounds Rectal:deferred Musculoskeletal: Symmetrical with no gross deformities  Skin: No lesions on visible extremities Pulses:  Normal pulses noted Extremities: No clubbing, cyanosis, edema or deformities noted Neurological: Alert oriented x 4, grossly nonfocal Cervical Nodes:  No significant cervical adenopathy Inguinal Nodes: No significant inguinal adenopathy Psychological:  Alert and cooperative. Normal mood and affect  Impression-history of adenomatous polyp  Plan-colonoscopy

## 2014-11-28 NOTE — Anesthesia Postprocedure Evaluation (Signed)
  Anesthesia Post-op Note  Patient: Damon [name]  Procedure(s) Performed: Procedure(s) (LRB): COLONOSCOPY (N/A)  Patient Location: PACU  Anesthesia Type: MAC  Level of Consciousness: awake and alert   Airway and Oxygen Therapy: Patient Spontanous Breathing  Post-op Pain: mild  Post-op Assessment: Post-op Vital signs reviewed, Patient's Cardiovascular Status Stable, Respiratory Function Stable, Patent Airway and No signs of Nausea or vomiting  Last Vitals:  Filed Vitals:   11/28/14 1210  BP: 130/87  Pulse: 59  Temp:   Resp: 14    Post-op Vital Signs: stable   Complications: No apparent anesthesia complications

## 2014-11-28 NOTE — Op Note (Signed)
[facility] [address] [place], [address]   COLONOSCOPY PROCEDURE REPORT     EXAM DATE: 11/28/2014  PATIENT NAME:      Damon [name], Damon [name]           MR #:      [id number] BIRTHDATE:       12-26-1960      VISIT #:     [id number]  ATTENDING:     Louis Meckel, MD     STATUS:     outpatient ASSISTANT:      Elby Showers and Olene Craven  INDICATIONS:  The patient is a 54 yr old male here for a colonoscopy due to high risk patient with personal history of colonic polyps.2011 PROCEDURE PERFORMED:     Colonoscopy, diagnostic MEDICATIONS:     Monitored anesthesia care ESTIMATED BLOOD LOSS:     None  CONSENT: The patient understands the risks and benefits of the procedure and understands that these risks include, but are not limited to: sedation, allergic reaction, infection, perforation and/or bleeding. Alternative means of evaluation and treatment include, among others: physical exam, x-rays, and/or surgical intervention. The patient elects to proceed with this endoscopic procedure.  DESCRIPTION OF PROCEDURE: During intra-op preparation period all mechanical & medical equipment was checked for proper function. Hand hygiene and appropriate measures for infection prevention was taken. After the risks, benefits and alternatives of the procedure were thoroughly explained, Informed consent was verified, confirmed and timeout was successfully executed by the treatment team. A digital exam revealed no abnormalities of the rectum. The Pentax Ped Colon [id number] endoscope was introduced through the anus and advanced to the terminal ileum which was intubated for a short distance. (Suprep was used) excellent. The instrument was then slowly withdrawn as the colon was fully examined.Estimated blood loss is zero unless otherwise noted in this procedure report.   COLON FINDINGS: A normal appearing cecum, ileocecal valve, and appendiceal orifice were identified.  The  ascending, transverse, descending, sigmoid colon, and rectum appeared unremarkable. Retroflexed views revealed no abnormalities. The scope was then completely withdrawn from the patient and the procedure terminated.  SCOPE WITHDRAWAL TIME:    ADVERSE EVENTS:      There were no immediate complications.  IMPRESSIONS:     Normal colonoscopy  RECOMMENDATIONS:     Repeat Colonscopy in 10 years. RECALL:  _____________________________ Louis Meckel, MD eSigned:  Louis Meckel, MD 11/28/2014 11:42 AM   cc:  Evelena Peat, MD   CPT CODES: ICD CODES:  The ICD and CPT codes recommended by this software are interpretations from the data that the clinical staff has captured with the software.  The verification of the translation of this report to the ICD and CPT codes and modifiers is the sole responsibility of the health care institution and practicing physician where this report was generated.  [employer] will not be held responsible for the validity of the ICD and CPT codes included on this report.  AMA assumes no liability for data contained or not contained herein. CPT is a Publishing rights manager of the [employer].   PATIENT NAME:  Damon [name] MR#: [id number]

## 2014-11-28 NOTE — Transfer of Care (Signed)
Immediate Anesthesia Transfer of Care Note  Patient: Damon [name]  Procedure(s) Performed: Procedure(s): COLONOSCOPY (N/A)  Patient Location: PACU  Anesthesia Type:MAC  Level of Consciousness:  sedated, patient cooperative and responds to stimulation  Airway & Oxygen Therapy:Patient Spontanous Breathing and Patient connected to face mask oxgen  Post-op Assessment:  Report given to PACU RN and Post -op Vital signs reviewed and stable  Post vital signs:  Reviewed and stable  Last Vitals:  Filed Vitals:   11/28/14 1009  BP: 129/91  Pulse: 64  Temp: 36.4 C  Resp: 24    Complications: No apparent anesthesia complications

## 2014-12-01 ENCOUNTER — Encounter (HOSPITAL_COMMUNITY): Payer: Self-pay | Admitting: Gastroenterology

## 2014-12-08 ENCOUNTER — Other Ambulatory Visit (INDEPENDENT_AMBULATORY_CARE_PROVIDER_SITE_OTHER): Payer: BLUE CROSS/BLUE SHIELD

## 2014-12-08 DIAGNOSIS — Z0000 Encounter for general adult medical examination without abnormal findings: Secondary | ICD-10-CM

## 2014-12-08 LAB — BASIC METABOLIC PANEL
BUN: 19 mg/dL (ref 6–23)
CO2: 29 mEq/L (ref 19–32)
Calcium: 9.7 mg/dL (ref 8.4–10.5)
Chloride: 101 mEq/L (ref 96–112)
Creatinine, Ser: 0.88 mg/dL (ref 0.40–1.50)
GFR: 95.46 mL/min (ref >60.00)
Glucose, Bld: 92 mg/dL (ref 70–99)
Potassium: 4.9 mEq/L (ref 3.5–5.1)
Sodium: 139 mEq/L (ref 135–145)

## 2014-12-08 LAB — CBC WITH DIFFERENTIAL/PLATELET
Basophils Absolute: 0 10*3/uL (ref 0.0–0.1)
Basophils Relative: 0.4 % (ref 0.0–3.0)
Eosinophils Absolute: 0.1 10*3/uL (ref 0.0–0.7)
Eosinophils Relative: 1.7 % (ref 0.0–5.0)
HCT: 44.4 % (ref 39.0–52.0)
Hemoglobin: 14.5 g/dL (ref 13.0–17.0)
Lymphocytes Relative: 24.6 % (ref 12.0–46.0)
Lymphs Abs: 1.9 10*3/uL (ref 0.7–4.0)
MCHC: 32.7 g/dL (ref 30.0–36.0)
MCV: 87.8 fl (ref 78.0–100.0)
Monocytes Absolute: 0.8 10*3/uL (ref 0.1–1.0)
Monocytes Relative: 11 % (ref 3.0–12.0)
Neutro Abs: 4.8 10*3/uL (ref 1.4–7.7)
Neutrophils Relative %: 62.3 % (ref 43.0–77.0)
Platelets: 245 10*3/uL (ref 150.0–400.0)
RBC: 5.06 Mil/uL (ref 4.22–5.81)
RDW: 12.9 % (ref 11.5–15.5)
WBC: 7.7 10*3/uL (ref 4.0–10.5)

## 2014-12-08 LAB — HEPATIC FUNCTION PANEL
ALT: 21 U/L (ref 0–53)
AST: 22 U/L (ref 0–37)
Albumin: 4.4 g/dL (ref 3.5–5.2)
Alkaline Phosphatase: 79 U/L (ref 39–117)
Bilirubin, Direct: 0.1 mg/dL (ref 0.0–0.3)
Total Bilirubin: 0.4 mg/dL (ref 0.2–1.2)
Total Protein: 7.5 g/dL (ref 6.0–8.3)

## 2014-12-08 LAB — LIPID PANEL
Cholesterol: 172 mg/dL (ref 0–200)
HDL: 50.3 mg/dL (ref >39.00)
LDL Cholesterol: 100 mg/dL — ABNORMAL HIGH (ref 0–99)
NonHDL: 122.08
Total CHOL/HDL Ratio: 3
Triglycerides: 110 mg/dL (ref 0.0–149.0)
VLDL: 22 mg/dL (ref 0.0–40.0)

## 2014-12-08 LAB — TSH: TSH: 0.89 u[IU]/mL (ref 0.35–4.50)

## 2014-12-08 LAB — PSA: PSA: 2.89 ng/mL (ref 0.10–4.00)

## 2014-12-15 ENCOUNTER — Encounter: Payer: Self-pay | Admitting: Family Medicine

## 2014-12-15 ENCOUNTER — Ambulatory Visit (INDEPENDENT_AMBULATORY_CARE_PROVIDER_SITE_OTHER): Payer: BLUE CROSS/BLUE SHIELD | Admitting: Family Medicine

## 2014-12-15 VITALS — BP 120/74 | HR 72 | Temp 97.5°F | Ht 67.72 in | Wt 168.5 lb

## 2014-12-15 DIAGNOSIS — Z0000 Encounter for general adult medical examination without abnormal findings: Secondary | ICD-10-CM | POA: Diagnosis not present

## 2014-12-15 DIAGNOSIS — Z23 Encounter for immunization: Secondary | ICD-10-CM

## 2014-12-15 MED ORDER — ATORVASTATIN CALCIUM 20 MG PO TABS: 20.0000 mg | ORAL_TABLET | Freq: Every day | ORAL | Status: DC

## 2014-12-15 MED ORDER — LISINOPRIL 20 MG PO TABS: 20.0000 mg | ORAL_TABLET | Freq: Every day | ORAL | Status: DC

## 2014-12-15 NOTE — Progress Notes (Signed)
Pre visit review using our clinic review tool, if applicable. No additional management support is needed unless otherwise documented below in the visit note. 

## 2014-12-15 NOTE — Progress Notes (Signed)
Subjective:    Patient ID: Damon [name], male    DOB: 03/03/1960, 54 y.o.   MRN: [id number]  HPI  Patient seen for complete physical. He has hypertension and hyperlipidemia. Remains on lisinopril for hypertension and blood pressures have been stable. He has been less diligent with exercise over the past year and also less strict with dietary control. He has gained about 14 pounds since last year. He had biceps rupture on the right and has minimal discomfort at this time. He is seen both orthopedist and sports medicine specialist regarding this. Needs flu vaccine. Tetanus up-to-date. Colonoscopy was normal couple weeks ago.  He had hernia repair earlier this year which went well  Past Medical History  Diagnosis Date  . Hyperlipidemia   . Hypertension   . Allergy     seasonal   . Heart murmur     hx of in childhood   . Depression     situational   . Injury of tendon of long head of right biceps     January 2016   Past Surgical History  Procedure Laterality Date  . Tonsillectomy  1995  . Appendectomy    . Tonsillectomy    . Mouth surgery      wisdom teeth removed;root canal - 03/2014  . Colonscopy       removed polyps  . Inguinal hernia repair Left 06/12/2014    Procedure: LEFT INGUINAL HERNIA REPAIR WITH MESH;  Surgeon: Darnell Level, MD;  Location: [facility];  Service: General;  Laterality: Left;  . Insertion of mesh Left 06/12/2014    Procedure: INSERTION OF MESH;  Surgeon: Darnell Level, MD;  Location: [facility];  Service: General;  Laterality: Left;  . Colonoscopy N/A 11/28/2014    Procedure: COLONOSCOPY;  Surgeon: Louis Meckel, MD;  Location: [facility];  Service: Endoscopy;  Laterality: N/A;    reports that he quit smoking about 19 years ago. His smoking use included Cigarettes. He has a 34.5 pack-year smoking history. He has never used smokeless tobacco. He reports that he drinks alcohol. He reports that he does not use illicit drugs. family history includes Cancer in his other and  paternal grandfather; Depression in his other; Heart disease in his other and paternal grandfather; Hyperlipidemia in his other; Hypertension in his other; Stroke in his other. Allergies  Allergen Reactions  . Poison Ivy Extract [Extract Of Poison Ivy] Itching and Rash  . Poison Oak Extract [Extract Of Poison Oak] Itching and Rash     Review of Systems  Constitutional: Negative for fever, activity change, appetite change and fatigue.  HENT: Negative for congestion, ear pain and trouble swallowing.   Eyes: Negative for pain and visual disturbance.  Respiratory: Negative for cough, shortness of breath and wheezing.   Cardiovascular: Negative for chest pain and palpitations.  Gastrointestinal: Negative for nausea, vomiting, abdominal pain, diarrhea, constipation, blood in stool, abdominal distention and rectal pain.  Endocrine: Negative for polydipsia and polyuria.  Genitourinary: Negative for dysuria, hematuria and testicular pain.  Musculoskeletal: Negative for joint swelling and arthralgias.  Skin: Negative for rash.  Neurological: Negative for dizziness, syncope and headaches.  Hematological: Negative for adenopathy.  Psychiatric/Behavioral: Negative for confusion and dysphoric mood.       Objective:   Physical Exam  Constitutional: He is oriented to person, place, and time. He appears well-developed and well-nourished. No distress.  HENT:  Head: Normocephalic and atraumatic.  Right Ear: External ear normal.  Left Ear: External ear normal.  Mouth/Throat: Oropharynx is clear and moist.  Eyes: Conjunctivae and EOM are normal. Pupils are equal, round, and reactive to light.  Neck: Normal range of motion. Neck supple. No thyromegaly present.  Cardiovascular: Normal rate, regular rhythm and normal heart sounds.   No murmur heard. Pulmonary/Chest: No respiratory distress. He has no wheezes. He has no rales.  Abdominal: Soft. Bowel sounds are normal. He exhibits no distension and no  mass. There is no tenderness. There is no rebound and no guarding.  Musculoskeletal: He exhibits no edema.  He has evidence for proximal biceps tendon rupture on the right but full extremity strength with biceps use  Lymphadenopathy:    He has no cervical adenopathy.  Neurological: He is alert and oriented to person, place, and time. He displays normal reflexes. No cranial nerve deficit.  Skin: No rash noted.  Psychiatric: He has a normal mood and affect.          Assessment & Plan:  Complete physical. Labs reviewed with no major concerns. Flu vaccine given. Refills given for chronic medications for one year. He is encouraged to step up diligence of exercise and lose some weight

## 2014-12-15 NOTE — Patient Instructions (Signed)
Try to lose some weight Establish more consistent exercise. 

## 2015-04-23 ENCOUNTER — Ambulatory Visit (INDEPENDENT_AMBULATORY_CARE_PROVIDER_SITE_OTHER): Payer: BLUE CROSS/BLUE SHIELD | Admitting: Sports Medicine

## 2015-04-23 ENCOUNTER — Encounter: Payer: Self-pay | Admitting: Sports Medicine

## 2015-04-23 VITALS — BP 127/79 | HR 68 | Ht 69.0 in | Wt 160.0 lb

## 2015-04-23 DIAGNOSIS — S46111D Strain of muscle, fascia and tendon of long head of biceps, right arm, subsequent encounter: Secondary | ICD-10-CM | POA: Diagnosis not present

## 2015-04-23 DIAGNOSIS — M7582 Other shoulder lesions, left shoulder: Secondary | ICD-10-CM

## 2015-04-23 MED ORDER — NITROGLYCERIN 0.2 MG/HR TD PT24: MEDICATED_PATCH | TRANSDERMAL | Status: DC

## 2015-04-23 NOTE — Progress Notes (Signed)
Patient ID: Damon [name], male   DOB: 04/04/1960, 55 y.o.   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 Palpation is normal with no tenderness over AC joint or bicipital groove. ROM is full in all planes. Rotator cuff strength normal throughout.  Strong signs of impingement with weakness on both Hawkin's tests, empty can. Speeds and Yergason's tests normal. Note RT arm has short biceps (popeye mm)  No labral pathology noted with negative Obrien's, negative clunk and good stability. Normal scapular function observed. No painful arc and no drop arm sign. No apprehension sign  [place] shoulder BT is normal Supraspinatus shows calcification in distal mid tendon/ no tear noted/ some impingement of tendon on motion infraspin and subscap and TM tendons are norm AC joint - calcification but not narrowed

## 2015-04-23 NOTE — Assessment & Plan Note (Signed)
NTG protocol  RC rehab protocol to emphasize supraspinatus  Keep up weight exercises that do not cause  Pain  Avoid overhead lifts  Reck 6 weeks

## 2015-04-23 NOTE — Assessment & Plan Note (Signed)
This has shortened but now has very solid mm and excelletn strength

## 2015-04-23 NOTE — Patient Instructions (Signed)
Nitroglycerin Protocol   Apply 1/4 nitroglycerin patch to affected area daily.  Change position of patch within the affected area every 24 hours.  You may experience a headache during the first 1-2 weeks of using the patch, these should subside.  If you experience headaches after beginning nitroglycerin patch treatment, you may take your preferred over the counter pain reliever.  Another side effect of the nitroglycerin patch is skin irritation or rash related to patch adhesive.  Please notify our office if you develop more severe headaches or rash, and stop the patch.  Tendon healing with nitroglycerin patch may require 12 to 24 weeks depending on the extent of injury.  Men should not use if taking Viagra, Cialis, or Levitra.   Do not use if you have migraines or rosacea.    Do the rotator cuff exercises from the sheet

## 2015-04-30 ENCOUNTER — Ambulatory Visit: Payer: BLUE CROSS/BLUE SHIELD | Admitting: Sports Medicine

## 2015-07-08 DIAGNOSIS — F411 Generalized anxiety disorder: Secondary | ICD-10-CM | POA: Diagnosis not present

## 2015-10-15 DIAGNOSIS — F411 Generalized anxiety disorder: Secondary | ICD-10-CM | POA: Diagnosis not present

## 2015-11-11 ENCOUNTER — Telehealth: Payer: Self-pay | Admitting: Family Medicine

## 2015-11-11 MED ORDER — LISINOPRIL 20 MG PO TABS: 20.0000 mg | ORAL_TABLET | Freq: Every day | ORAL | 0 refills | Status: DC

## 2015-11-11 NOTE — Telephone Encounter (Signed)
Medication sent in for patient. 

## 2015-11-11 NOTE — Telephone Encounter (Signed)
Pt has cpe on 12/15/15.  Pt had Rx for 365 tabs written 10/24  lisinopril (PRINIVIL,ZESTRIL) 20 MG tablet  But they must have short him at the pharmacy, now he will not have enough to get through to appointment.  Pt would like to know if we can send 90 day to  [facility],  a

## 2015-11-23 ENCOUNTER — Telehealth: Payer: Self-pay | Admitting: Family Medicine

## 2015-11-23 NOTE — Telephone Encounter (Signed)
error 

## 2015-12-09 ENCOUNTER — Other Ambulatory Visit (INDEPENDENT_AMBULATORY_CARE_PROVIDER_SITE_OTHER): Payer: BLUE CROSS/BLUE SHIELD

## 2015-12-09 DIAGNOSIS — R7989 Other specified abnormal findings of blood chemistry: Secondary | ICD-10-CM | POA: Diagnosis not present

## 2015-12-09 DIAGNOSIS — Z0000 Encounter for general adult medical examination without abnormal findings: Secondary | ICD-10-CM | POA: Diagnosis not present

## 2015-12-09 LAB — CBC WITH DIFFERENTIAL/PLATELET
Basophils Absolute: 0.1 10*3/uL (ref 0.0–0.1)
Basophils Relative: 0.6 % (ref 0.0–3.0)
Eosinophils Absolute: 0.2 10*3/uL (ref 0.0–0.7)
Eosinophils Relative: 2.3 % (ref 0.0–5.0)
HCT: 44 % (ref 39.0–52.0)
Hemoglobin: 14.8 g/dL (ref 13.0–17.0)
Lymphocytes Relative: 23.7 % (ref 12.0–46.0)
Lymphs Abs: 2.2 10*3/uL (ref 0.7–4.0)
MCHC: 33.6 g/dL (ref 30.0–36.0)
MCV: 86.4 fl (ref 78.0–100.0)
Monocytes Absolute: 1.1 10*3/uL — ABNORMAL HIGH (ref 0.1–1.0)
Monocytes Relative: 12.2 % — ABNORMAL HIGH (ref 3.0–12.0)
Neutro Abs: 5.7 10*3/uL (ref 1.4–7.7)
Neutrophils Relative %: 61.2 % (ref 43.0–77.0)
Platelets: 224 10*3/uL (ref 150.0–400.0)
RBC: 5.09 Mil/uL (ref 4.22–5.81)
RDW: 13.6 % (ref 11.5–15.5)
WBC: 9.4 10*3/uL (ref 4.0–10.5)

## 2015-12-09 LAB — BASIC METABOLIC PANEL
BUN: 17 mg/dL (ref 6–23)
CO2: 29 mEq/L (ref 19–32)
Calcium: 9.8 mg/dL (ref 8.4–10.5)
Chloride: 103 mEq/L (ref 96–112)
Creatinine, Ser: 0.91 mg/dL (ref 0.40–1.50)
GFR: 91.5 mL/min (ref >60.00)
Glucose, Bld: 101 mg/dL — ABNORMAL HIGH (ref 70–99)
Potassium: 5.4 mEq/L — ABNORMAL HIGH (ref 3.5–5.1)
Sodium: 138 mEq/L (ref 135–145)

## 2015-12-09 LAB — HEPATIC FUNCTION PANEL
ALT: 32 U/L (ref 0–53)
AST: 25 U/L (ref 0–37)
Albumin: 4.5 g/dL (ref 3.5–5.2)
Alkaline Phosphatase: 80 U/L (ref 39–117)
Bilirubin, Direct: 0.1 mg/dL (ref 0.0–0.3)
Total Bilirubin: 0.3 mg/dL (ref 0.2–1.2)
Total Protein: 7.1 g/dL (ref 6.0–8.3)

## 2015-12-09 LAB — PSA: PSA: 3.87 ng/mL (ref 0.10–4.00)

## 2015-12-09 LAB — TSH: TSH: 1.44 u[IU]/mL (ref 0.35–4.50)

## 2015-12-09 LAB — LIPID PANEL
Cholesterol: 177 mg/dL (ref 0–200)
HDL: 39.4 mg/dL (ref >39.00)
NonHDL: 137.3
Total CHOL/HDL Ratio: 4
Triglycerides: 365 mg/dL — ABNORMAL HIGH (ref 0.0–149.0)
VLDL: 73 mg/dL — ABNORMAL HIGH (ref 0.0–40.0)

## 2015-12-09 LAB — LDL CHOLESTEROL, DIRECT: Direct LDL: 93 mg/dL

## 2015-12-16 ENCOUNTER — Encounter: Payer: BLUE CROSS/BLUE SHIELD | Admitting: Family Medicine

## 2015-12-21 ENCOUNTER — Ambulatory Visit (INDEPENDENT_AMBULATORY_CARE_PROVIDER_SITE_OTHER): Payer: BLUE CROSS/BLUE SHIELD | Admitting: Family Medicine

## 2015-12-21 ENCOUNTER — Encounter: Payer: Self-pay | Admitting: Family Medicine

## 2015-12-21 DIAGNOSIS — R300 Dysuria: Secondary | ICD-10-CM | POA: Diagnosis not present

## 2015-12-21 DIAGNOSIS — R972 Elevated prostate specific antigen [PSA]: Secondary | ICD-10-CM | POA: Diagnosis not present

## 2015-12-21 DIAGNOSIS — Z23 Encounter for immunization: Secondary | ICD-10-CM

## 2015-12-21 DIAGNOSIS — Z0000 Encounter for general adult medical examination without abnormal findings: Secondary | ICD-10-CM

## 2015-12-21 LAB — POCT URINALYSIS DIPSTICK
Bilirubin, UA: NEGATIVE
Blood, UA: NEGATIVE
Glucose, UA: NEGATIVE
Ketones, UA: NEGATIVE
Nitrite, UA: NEGATIVE
Protein, UA: NEGATIVE
Spec Grav, UA: 1.025
Urobilinogen, UA: 0.2
pH, UA: 6

## 2015-12-21 MED ORDER — ATORVASTATIN CALCIUM 20 MG PO TABS: 20.0000 mg | ORAL_TABLET | Freq: Every day | ORAL | 3 refills | Status: DC

## 2015-12-21 MED ORDER — LISINOPRIL 20 MG PO TABS: 20.0000 mg | ORAL_TABLET | Freq: Every day | ORAL | 3 refills | Status: DC

## 2015-12-21 NOTE — Progress Notes (Signed)
Pre visit review using our clinic review tool, if applicable. No additional management support is needed unless otherwise documented below in the visit note. 

## 2015-12-21 NOTE — Progress Notes (Signed)
Subjective:     Patient ID: Damon [name], male   DOB: 12/01/1960, 55 y.o.   MRN: [id number]  HPI Patient seen for physical exam. He has history of hypertension and hyperlipidemia. He's gained some weight since last year but recently started back more diligent exercise. He is currently exercising most days of the week. He has no specific risk factors for hepatitis C and has never been screened.  Patient is nonsmoker. Family history reviewed with no major changes.  He does complain of some mild dysuria and occasional burning with urination. No penile discharge. No history of STD.  Past Medical History:  Diagnosis Date  . Allergy    seasonal   . Depression    situational   . Heart murmur    hx of in childhood   . Hyperlipidemia   . Hypertension   . Injury of tendon of long head of right biceps    January 2016   Past Surgical History:  Procedure Laterality Date  . APPENDECTOMY    . COLONOSCOPY N/A 11/28/2014   Procedure: COLONOSCOPY;  Surgeon: Damon Meckelobert D Kaplan, MD;  Location: [facility];  Service: Endoscopy;  Laterality: N/A;  . colonscopy      removed polyps  . INGUINAL HERNIA REPAIR Left 06/12/2014   Procedure: LEFT INGUINAL HERNIA REPAIR WITH MESH;  Surgeon: Damon Levelodd Gerkin, MD;  Location: [facility];  Service: General;  Laterality: Left;  . INSERTION OF MESH Left 06/12/2014   Procedure: INSERTION OF MESH;  Surgeon: Damon Levelodd Gerkin, MD;  Location: [facility];  Service: General;  Laterality: Left;  . MOUTH SURGERY     wisdom teeth removed;root canal - 03/2014  . TONSILLECTOMY  1995  . TONSILLECTOMY      reports that he quit smoking about 20 years ago. His smoking use included Cigarettes. He has a 34.50 pack-year smoking history. He has never used smokeless tobacco. He reports that he drinks alcohol. He reports that he does not use drugs. family history includes Cancer in his other and paternal grandfather; Depression in his other; Heart disease in his other and paternal grandfather; Hyperlipidemia  in his other; Hypertension in his other; Stroke in his other. Allergies  Allergen Reactions  . Poison Ivy Extract [Poison Ivy Extract] Itching and Rash  . Poison Oak Extract [Poison Oak Extract] Itching and Rash     Review of Systems  Constitutional: Negative for activity change, appetite change, fatigue and fever.  HENT: Negative for congestion, ear pain and trouble swallowing.   Eyes: Negative for pain and visual disturbance.  Respiratory: Negative for cough, shortness of breath and wheezing.   Cardiovascular: Negative for chest pain and palpitations.  Gastrointestinal: Negative for abdominal distention, abdominal pain, blood in stool, constipation, diarrhea, nausea, rectal pain and vomiting.  Genitourinary: Positive for dysuria. Negative for decreased urine volume, discharge, flank pain, genital sores, hematuria, testicular pain and urgency.  Musculoskeletal: Negative for arthralgias and joint swelling.  Skin: Negative for rash.  Neurological: Negative for dizziness, syncope and headaches.  Hematological: Negative for adenopathy.  Psychiatric/Behavioral: Negative for confusion and dysphoric mood.       Objective:   Physical Exam  Constitutional: He is oriented to person, place, and time. He appears well-developed and well-nourished. No distress.  HENT:  Head: Normocephalic and atraumatic.  Right Ear: External ear normal.  Left Ear: External ear normal.  Mouth/Throat: Oropharynx is clear and moist.  Eyes: Conjunctivae and EOM are normal. Pupils are equal, round, and reactive to light.  Neck: Normal range  of motion. Neck supple. No thyromegaly present.  Cardiovascular: Normal rate, regular rhythm and normal heart sounds.   No murmur heard. Pulmonary/Chest: No respiratory distress. He has no wheezes. He has no rales.  Abdominal: Soft. Bowel sounds are normal. He exhibits no distension and no mass. There is no tenderness. There is no rebound and no guarding.  Musculoskeletal: He  exhibits no edema.  Lymphadenopathy:    He has no cervical adenopathy.  Neurological: He is alert and oriented to person, place, and time. He displays normal reflexes. No cranial nerve deficit.  Skin: No rash noted.  Psychiatric: He has a normal mood and affect.       Assessment:     Physical exam.  He has hx of hypertension and hyperlipidemia which are stable.    Plan:     -Check hepatitis C antibody, though no specific risk factors -Labs reviewed with elevated triglycerides and minimally elevated glucose. He is encouraged to reduce high glycemic foods and white starches and sugars -Continue regular exercise habits -Flu vaccine given -Check urinalysis (trace leukocytes and o/w normal).  Get urine culture. -PSA increased at 3.87 from 2.8 last year. Recheck PSA in 3 months. If climbing at that point consider urology referral  Damon CoveyBruce W Reegan Mctighe MD [facility]

## 2015-12-21 NOTE — Patient Instructions (Signed)

## 2015-12-22 ENCOUNTER — Encounter: Payer: Self-pay | Admitting: Family Medicine

## 2015-12-22 LAB — HEPATITIS C ANTIBODY: HCV Ab: NEGATIVE

## 2015-12-22 LAB — URINE CULTURE: Organism ID, Bacteria: NO GROWTH

## 2015-12-23 ENCOUNTER — Encounter: Payer: Self-pay | Admitting: Family Medicine

## 2015-12-26 ENCOUNTER — Other Ambulatory Visit: Payer: Self-pay | Admitting: Family Medicine

## 2015-12-28 MED ORDER — IBUPROFEN 800 MG PO TABS: ORAL_TABLET | ORAL | 0 refills | Status: DC

## 2016-02-02 DIAGNOSIS — F411 Generalized anxiety disorder: Secondary | ICD-10-CM | POA: Diagnosis not present

## 2016-02-26 DIAGNOSIS — F4320 Adjustment disorder, unspecified: Secondary | ICD-10-CM | POA: Diagnosis not present

## 2016-03-04 DIAGNOSIS — F4320 Adjustment disorder, unspecified: Secondary | ICD-10-CM | POA: Diagnosis not present

## 2016-03-11 ENCOUNTER — Ambulatory Visit (INDEPENDENT_AMBULATORY_CARE_PROVIDER_SITE_OTHER): Payer: BLUE CROSS/BLUE SHIELD | Admitting: Family Medicine

## 2016-03-11 ENCOUNTER — Encounter: Payer: Self-pay | Admitting: Family Medicine

## 2016-03-11 ENCOUNTER — Ambulatory Visit: Payer: Self-pay

## 2016-03-11 VITALS — BP 138/82 | Ht 69.0 in | Wt 162.0 lb

## 2016-03-11 DIAGNOSIS — M25521 Pain in right elbow: Secondary | ICD-10-CM

## 2016-03-11 DIAGNOSIS — M7582 Other shoulder lesions, left shoulder: Secondary | ICD-10-CM

## 2016-03-11 DIAGNOSIS — M7021 Olecranon bursitis, right elbow: Secondary | ICD-10-CM

## 2016-03-11 MED ORDER — METHYLPREDNISOLONE ACETATE 40 MG/ML IJ SUSP
40.0000 mg | Freq: Once | INTRAMUSCULAR | Status: AC
Start: 2016-03-11 — End: 2016-03-11
  Administered 2016-03-11: 40 mg via INTRA_ARTICULAR

## 2016-03-11 NOTE — Assessment & Plan Note (Signed)
Aspiration under US giodance with injection steroid today. Compression post procedure X 3-5 days. Discussed recurrence. IF it does, rtc.

## 2016-03-11 NOTE — Progress Notes (Signed)
  Hulan SaasHarry S Zehnder - 56 y.o. male MRN [id number]  Date of birth: 09/26/1960    SUBJECTIVE:      Chief Complaint:/ HPI:   1. RIGHT elbow swelling X 1 week. Not really painful but a little tender. Started when he accidentally slammed it into a part of a closet door. No problem extending. Some pain with carrying really heavy stuff. No numbness in hand. 2. Some continued problems with LEFT shoulder pain --particularly noted with use of pec deck at gym---he lifts 80 pounds and this reproduces pain. Right hand dominant. Has been seen for shoulder before, did some home rehab and it got better. Recently noted pain wit this isolated exercise.   ROS:     No fever. No other arthralgias or myalgias except as per HPI. No unusual weight loss.  PERTINENT  PMH / PSH FH / / SH:  Past Medical, Surgical, Social, and Family History Reviewed & Updated in the EMR.  Pertinent findings include:  Hx bicep tendon rupture---long head---RIGHT HTN Hyperlipidemia Former smoke 23 + pack years  OBJECTIVE: BP 138/82   Ht 5\' 9"  (1.753 m)   Wt 162 lb (73.5 kg)   BMI 23.92 kg/m   Physical Exam:  Vital signs are reviewed. WD WN NAD SHOULDERS: B symmetrial/ LEFT shoulder has FROM and full strength in all planes of rotator cuff. Very mild pain with extreme resisted supraspinatus and deltoid (middle) testing, but mild. AC joint is nontender on left. No deformity ELBOW: RIGHT:notable olecranon bursa swelling. Mildly ttp. Has FROm in flexion and extension and is not painful. Normal ROM  in supination and pronation. Bursa is enlarged, soft, mildly tender to palpation. SKIN over right elbow very slightly increased erythema but no increased warmth. VASC Radial pulses 2+B=.  US: Left shoulder rotator cuff muscles intact, some occasional calcification noted in supraspinatus. No impingement on dynamic testing. Visualized portion of labrum appears intact and normal. AC joint reveals some arthropathy and chronic effusion (mushroom  sign). RIGHT elbow---join is intact with no sign of joint effusion. All muscle attachments appaer normal including specifically triceps.The olecranon bursa is quite enlarged, full of fluid with some occasional debris noted floating within the boundaries of the bursa.   ULTRASOUND GUIDED ASPIRATION and INJECTION:RIGHT olecranon birsa Patient was given informed consent, signed copy in the chart. Appropriate time out was taken. Area prepped and draped in usual sterile fashion.2 cc of 1% lidocaine was used for local anesthesia. Ultrasound godance was used to identify the olecranon bursa and an 18 gauge needle attached to a 60 cc syringe was used with an associated stop cock to aspirate approximately 18 cc of slightly bloody fluid. With use of the stop cock, the aspiration was then completed and 1 cc of  methylprednisolone 40 mg/ml plus  1 cc of 1% lidocaine without epinephrine was injected into the olecranon using a(n) ultraound approach. The patient tolerated the procedure well. There were no complications. Post procedure instructions were given. A bandaid and an ace bandage were applied. Patient instructed to use compression for most of the hours of the day fpr next few days. F/u prn. Discussed possible re-accumulation.   ASSESSMENT & PLAN:  See problem based charting & AVS for pt instructions.

## 2016-03-11 NOTE — Assessment & Plan Note (Signed)
Recent increase in symptoms. Recommend decrease weight on pec deck to 60 pounds or less. If no improvement, rtc.

## 2016-03-22 ENCOUNTER — Other Ambulatory Visit (INDEPENDENT_AMBULATORY_CARE_PROVIDER_SITE_OTHER): Payer: BLUE CROSS/BLUE SHIELD

## 2016-03-22 DIAGNOSIS — R972 Elevated prostate specific antigen [PSA]: Secondary | ICD-10-CM

## 2016-03-23 LAB — PSA: PSA: 4.52 ng/mL — ABNORMAL HIGH (ref 0.10–4.00)

## 2016-03-25 ENCOUNTER — Telehealth: Payer: Self-pay | Admitting: Family Medicine

## 2016-03-25 ENCOUNTER — Other Ambulatory Visit: Payer: Self-pay

## 2016-03-25 DIAGNOSIS — R972 Elevated prostate specific antigen [PSA]: Secondary | ICD-10-CM

## 2016-03-25 NOTE — Telephone Encounter (Signed)
Pt would like md to call him back to discuss PSA result

## 2016-03-25 NOTE — Telephone Encounter (Signed)
Pt is aware of results. Order entered for referral. Pt still would like to discuss with you about results.

## 2016-03-30 DIAGNOSIS — R972 Elevated prostate specific antigen [PSA]: Secondary | ICD-10-CM | POA: Diagnosis not present

## 2016-03-30 DIAGNOSIS — N411 Chronic prostatitis: Secondary | ICD-10-CM | POA: Diagnosis not present

## 2016-03-30 DIAGNOSIS — R361 Hematospermia: Secondary | ICD-10-CM | POA: Diagnosis not present

## 2016-04-15 DIAGNOSIS — F4320 Adjustment disorder, unspecified: Secondary | ICD-10-CM | POA: Diagnosis not present

## 2016-04-20 DIAGNOSIS — R972 Elevated prostate specific antigen [PSA]: Secondary | ICD-10-CM | POA: Diagnosis not present

## 2016-04-22 DIAGNOSIS — F4320 Adjustment disorder, unspecified: Secondary | ICD-10-CM | POA: Diagnosis not present

## 2016-05-06 DIAGNOSIS — F411 Generalized anxiety disorder: Secondary | ICD-10-CM | POA: Diagnosis not present

## 2016-05-13 DIAGNOSIS — F4320 Adjustment disorder, unspecified: Secondary | ICD-10-CM | POA: Diagnosis not present

## 2016-06-03 DIAGNOSIS — F4320 Adjustment disorder, unspecified: Secondary | ICD-10-CM | POA: Diagnosis not present

## 2016-06-17 DIAGNOSIS — F4320 Adjustment disorder, unspecified: Secondary | ICD-10-CM | POA: Diagnosis not present

## 2016-07-01 DIAGNOSIS — F4320 Adjustment disorder, unspecified: Secondary | ICD-10-CM | POA: Diagnosis not present

## 2016-07-26 DIAGNOSIS — F411 Generalized anxiety disorder: Secondary | ICD-10-CM | POA: Diagnosis not present

## 2016-08-04 ENCOUNTER — Encounter: Payer: Self-pay | Admitting: Family Medicine

## 2016-08-04 ENCOUNTER — Ambulatory Visit (INDEPENDENT_AMBULATORY_CARE_PROVIDER_SITE_OTHER): Payer: BLUE CROSS/BLUE SHIELD | Admitting: Family Medicine

## 2016-08-04 VITALS — BP 122/88 | HR 82 | Temp 97.6°F | Wt 177.8 lb

## 2016-08-04 DIAGNOSIS — F4320 Adjustment disorder, unspecified: Secondary | ICD-10-CM | POA: Diagnosis not present

## 2016-08-04 DIAGNOSIS — T63481A Toxic effect of venom of other arthropod, accidental (unintentional), initial encounter: Secondary | ICD-10-CM

## 2016-08-04 MED ORDER — METHYLPREDNISOLONE ACETATE 40 MG/ML IJ SUSP
40.0000 mg | Freq: Once | INTRAMUSCULAR | Status: AC
  Administered 2016-08-04: 40 mg via INTRAMUSCULAR

## 2016-08-04 NOTE — Patient Instructions (Signed)
Advise continued use of Zyrtec for symptoms. If symptoms do not improve, please follow up for further evaluation and treatment.   Insect Bite, Adult An insect bite can make your skin red, itchy, and swollen. Some insects can spread disease to people with a bite. However, most insect bites do not lead to disease, and most are not serious. Follow these instructions at home: Bite area care  Do not scratch the bite area.  Keep the bite area clean and dry.  Wash the bite area every day with soap and water as told by your doctor.  Check the bite area every day for signs of infection. Check for: ? More redness, swelling, or pain. ? Fluid or blood. ? Warmth. ? Pus. Managing pain, itching, and swelling  You may put any of these on the bite area as told by your doctor: ? A baking soda paste. ? Cortisone cream. ? Calamine lotion.  If directed, put ice on the bite area. ? Put ice in a plastic bag. ? Place a towel between your skin and the bag. ? Leave the ice on for 20 minutes, 2-3 times a day. Medicines  Take medicines or put medicines on your skin only as told by your doctor.  If you were prescribed an antibiotic medicine, use it as told by your doctor. Do not stop using the antibiotic even if your condition improves. General instructions  Keep all follow-up visits as told by your doctor. This is important. How is this prevented? To help you have a lower risk of insect bites:  When you are outside, wear clothing that covers your arms and legs.  Use insect repellent. The best insect repellents have: ? An active ingredient of DEET, picaridin, oil of lemon eucalyptus (OLE), or IR3535. ? Higher amounts of DEET or another active ingredient than other repellents have.  If your home windows do not have screens, think about putting some in.  Contact a doctor if:  You have more redness, swelling, or pain in the bite area.  You have fluid, blood, or pus coming from the bite  area.  The bite area feels warm.  You have a fever. Get help right away if:  You have joint pain.  You have a rash.  You have shortness of breath.  You feel more tired or sleepy than you normally do.  You have neck pain.  You have a headache.  You feel weaker than you normally do.  You have chest pain.  You have pain in your belly.  You feel sick to your stomach (nauseous) or you throw up (vomit). Summary  An insect bite can make your skin red, itchy, and swollen.  Do not scratch the bite area, and keep it clean and dry.  Ice can help with pain and itching from the bite. This information is not intended to replace advice given to you by your health care provider. Make sure you discuss any questions you have with your health care provider. Document Released: 02/05/2000 Document Revised: 09/10/2015 Document Reviewed: 06/25/2014 Elsevier Interactive Patient Education  2018 [employer]

## 2016-08-04 NOTE — Progress Notes (Signed)
Subjective:    Patient ID: Damon [name], male    DOB: Sep 06, 1960, 56 y.o.   MRN: [id number]  HPI  Damon [name] is a 56 year old male who presents today with ant bites that occurred yesterday.  He was standing in pine needles when this occurred.   Associated symptoms of stinging pain and pruritus. Bites occurred on lower extremities, right lower extremity and right arm as he was trying to remove these. He denies SOB, swelling,  fever, chills, sweats, N/V/D, difficulty swallowing, swelling of tongue Treatment with Zyrtec has provided limited benefit.   He has a history of local reactions to insect bites/bee stings  Review of Systems  Constitutional: Negative for chills, fatigue and fever.  Respiratory: Negative for cough, shortness of breath and wheezing.   Cardiovascular: Negative for chest pain and palpitations.  Gastrointestinal: Negative for abdominal pain, diarrhea, nausea and vomiting.  Musculoskeletal: Negative for myalgias.  Skin:       Insect bite   Past Medical History:  Diagnosis Date  . Allergy    seasonal   . Depression    situational   . Heart murmur    hx of in childhood   . Hyperlipidemia   . Hypertension   . Injury of tendon of long head of right biceps    January 2016     Social History   Social History  . Marital status: Single    Spouse name: N/A  . Number of children: N/A  . Years of education: N/A   Occupational History  . Not on file.   Social History Main Topics  . Smoking status: Former Smoker    Packs/day: 1.50    Years: 23.00    Types: Cigarettes    Quit date: 02/22/1995  . Smokeless tobacco: Never Used  . Alcohol use Yes     Comment: occas  . Drug use: No  . Sexual activity: Not on file   Other Topics Concern  . Not on file   Social History Narrative  . No narrative on file    Past Surgical History:  Procedure Laterality Date  . APPENDECTOMY    . COLONOSCOPY N/A 11/28/2014   Procedure: COLONOSCOPY;  Surgeon: Louis Meckel,  MD;  Location: [facility];  Service: Endoscopy;  Laterality: N/A;  . colonscopy      removed polyps  . INGUINAL HERNIA REPAIR Left 06/12/2014   Procedure: LEFT INGUINAL HERNIA REPAIR WITH MESH;  Surgeon: Darnell Level, MD;  Location: [facility];  Service: General;  Laterality: Left;  . INSERTION OF MESH Left 06/12/2014   Procedure: INSERTION OF MESH;  Surgeon: Darnell Level, MD;  Location: [facility];  Service: General;  Laterality: Left;  . MOUTH SURGERY     wisdom teeth removed;root canal - 03/2014  . TONSILLECTOMY  1995  . TONSILLECTOMY      Family History  Problem Relation Age of Onset  . Depression Other   . Hyperlipidemia Other   . Hypertension Other   . Cancer Other        prostate  . Stroke Other   . Heart disease Other   . Cancer Paternal Grandfather   . Heart disease Paternal Grandfather     Allergies  Allergen Reactions  . Poison Ivy Extract [Poison Ivy Extract] Itching and Rash  . Poison Oak Extract [Poison Oak Extract] Itching and Rash    Current Outpatient Prescriptions on File Prior to Visit  Medication Sig Dispense Refill  . atorvastatin (LIPITOR) 20  MG tablet Take 1 tablet (20 mg total) by mouth daily. 90 tablet 3  . cetirizine (ZYRTEC) 10 MG tablet Take 10 mg by mouth 2 (two) times daily.    . clonazePAM (KLONOPIN) 0.5 MG tablet Take 0.5 mg by mouth 2 (two) times daily as needed for anxiety.    . Hyprom-Naphaz-Polysorb-Zn Sulf (CLEAR EYES COMPLETE) SOLN Apply 1 drop to eye daily as needed (Allergies).    Marland Kitchen. ibuprofen (ADVIL,MOTRIN) 800 MG tablet TAKE 1 TABLET BY MOUTH EVERY 8 HOURS AS NEEDED PAIN 90 tablet 0  . lisinopril (PRINIVIL,ZESTRIL) 20 MG tablet Take 1 tablet (20 mg total) by mouth daily. 90 tablet 3  . Na Sulfate-K Sulfate-Mg Sulf SOLN Take by mouth as instructed. 354 mL 0  . naproxen (NAPROSYN) 500 MG tablet TAKE 1 TABLET TWICE DAILY WITH FOOD. 60 tablet 2   No current facility-administered medications on file prior to visit.     BP 122/88 (BP Location:  Left Arm, Patient Position: Sitting, Cuff Size: Normal)   Pulse 82   Temp 97.6 F (36.4 C) (Oral)   Wt 177 lb 12.8 oz (80.6 kg)   SpO2 97%   BMI 26.26 kg/m       Objective:   Physical Exam  Constitutional: He is oriented to person, place, and time. He appears well-developed and well-nourished.  HENT:  Mouth/Throat: Oropharynx is clear and moist.  Eyes: Pupils are equal, round, and reactive to light. No scleral icterus.  Neck: Neck supple.  Cardiovascular: Normal rate and regular rhythm.   Pulmonary/Chest: Effort normal and breath sounds normal. He has no wheezes. He has no rales.  Musculoskeletal: He exhibits no edema.  Lymphadenopathy:    He has no cervical adenopathy.  Neurological: He is alert and oriented to person, place, and time.  Skin: Skin is warm and dry.  Erythema and mild swelling localized on right arm with one bite and around ankles with scattered bites.   Psychiatric: He has a normal mood and affect. His behavior is normal. Judgment and thought content normal.       Assessment & Plan:  1. Insect sting, accidental or unintentional, initial encounter Local reaction; Depo-medrol injection today; advised antihistamine and cool compresses. Advised follow up if symptoms are not improving over the next 3 to 5 days or if any new symptoms develop such as fever, or trouble breathing.  Roddie McJulia Talise Sligh, FNP-C

## 2016-08-11 DIAGNOSIS — F4320 Adjustment disorder, unspecified: Secondary | ICD-10-CM | POA: Diagnosis not present

## 2016-08-19 DIAGNOSIS — F4320 Adjustment disorder, unspecified: Secondary | ICD-10-CM | POA: Diagnosis not present

## 2016-08-26 DIAGNOSIS — F4320 Adjustment disorder, unspecified: Secondary | ICD-10-CM | POA: Diagnosis not present

## 2016-09-09 DIAGNOSIS — F4320 Adjustment disorder, unspecified: Secondary | ICD-10-CM | POA: Diagnosis not present

## 2016-09-13 ENCOUNTER — Telehealth: Payer: Self-pay | Admitting: Family Medicine

## 2016-09-13 NOTE — Telephone Encounter (Signed)
[employer] forms to be filled out. **Call patient before forms are filled out if there will be a charge.  -Fax form to [phone], attn: [employer] upon completion

## 2016-09-19 NOTE — Telephone Encounter (Signed)
Copy faxed and confirmed 

## 2016-09-23 DIAGNOSIS — F4320 Adjustment disorder, unspecified: Secondary | ICD-10-CM | POA: Diagnosis not present

## 2016-10-07 DIAGNOSIS — F4320 Adjustment disorder, unspecified: Secondary | ICD-10-CM | POA: Diagnosis not present

## 2016-10-18 DIAGNOSIS — F411 Generalized anxiety disorder: Secondary | ICD-10-CM | POA: Diagnosis not present

## 2016-10-21 DIAGNOSIS — F4320 Adjustment disorder, unspecified: Secondary | ICD-10-CM | POA: Diagnosis not present

## 2016-11-10 ENCOUNTER — Encounter: Payer: Self-pay | Admitting: Family Medicine

## 2016-11-10 DIAGNOSIS — F4320 Adjustment disorder, unspecified: Secondary | ICD-10-CM | POA: Diagnosis not present

## 2016-11-25 DIAGNOSIS — F4320 Adjustment disorder, unspecified: Secondary | ICD-10-CM | POA: Diagnosis not present

## 2016-12-08 DIAGNOSIS — F4320 Adjustment disorder, unspecified: Secondary | ICD-10-CM | POA: Diagnosis not present

## 2016-12-21 ENCOUNTER — Encounter: Payer: BLUE CROSS/BLUE SHIELD | Admitting: Family Medicine

## 2016-12-23 DIAGNOSIS — F4320 Adjustment disorder, unspecified: Secondary | ICD-10-CM | POA: Diagnosis not present

## 2016-12-27 ENCOUNTER — Other Ambulatory Visit: Payer: Self-pay | Admitting: Family Medicine

## 2016-12-27 NOTE — Telephone Encounter (Signed)
Rx sent for a 30 day supply with no refills. Pt needs an OV last seen was 11/2015 a year ago.

## 2016-12-28 ENCOUNTER — Encounter: Payer: Self-pay | Admitting: Family Medicine

## 2016-12-28 ENCOUNTER — Ambulatory Visit (INDEPENDENT_AMBULATORY_CARE_PROVIDER_SITE_OTHER): Payer: BLUE CROSS/BLUE SHIELD | Admitting: Family Medicine

## 2016-12-28 VITALS — BP 122/82 | HR 72 | Temp 97.7°F | Ht 69.0 in | Wt 182.1 lb

## 2016-12-28 DIAGNOSIS — Z23 Encounter for immunization: Secondary | ICD-10-CM

## 2016-12-28 DIAGNOSIS — Z125 Encounter for screening for malignant neoplasm of prostate: Secondary | ICD-10-CM

## 2016-12-28 DIAGNOSIS — Z0000 Encounter for general adult medical examination without abnormal findings: Secondary | ICD-10-CM

## 2016-12-28 LAB — BASIC METABOLIC PANEL
BUN: 15 mg/dL (ref 6–23)
CO2: 31 mEq/L (ref 19–32)
Calcium: 9.6 mg/dL (ref 8.4–10.5)
Chloride: 103 mEq/L (ref 96–112)
Creatinine, Ser: 0.89 mg/dL (ref 0.40–1.50)
GFR: 93.53 mL/min (ref >60.00)
Glucose, Bld: 93 mg/dL (ref 70–99)
Potassium: 4.9 mEq/L (ref 3.5–5.1)
Sodium: 140 mEq/L (ref 135–145)

## 2016-12-28 LAB — CBC WITH DIFFERENTIAL/PLATELET
Basophils Absolute: 0 10*3/uL (ref 0.0–0.1)
Basophils Relative: 0.6 % (ref 0.0–3.0)
Eosinophils Absolute: 0.1 10*3/uL (ref 0.0–0.7)
Eosinophils Relative: 1.3 % (ref 0.0–5.0)
HCT: 42.9 % (ref 39.0–52.0)
Hemoglobin: 14.3 g/dL (ref 13.0–17.0)
Lymphocytes Relative: 22.4 % (ref 12.0–46.0)
Lymphs Abs: 1.6 10*3/uL (ref 0.7–4.0)
MCHC: 33.3 g/dL (ref 30.0–36.0)
MCV: 87.5 fl (ref 78.0–100.0)
Monocytes Absolute: 0.7 10*3/uL (ref 0.1–1.0)
Monocytes Relative: 9.3 % (ref 3.0–12.0)
Neutro Abs: 4.8 10*3/uL (ref 1.4–7.7)
Neutrophils Relative %: 66.4 % (ref 43.0–77.0)
Platelets: 227 10*3/uL (ref 150.0–400.0)
RBC: 4.9 Mil/uL (ref 4.22–5.81)
RDW: 13 % (ref 11.5–15.5)
WBC: 7.3 10*3/uL (ref 4.0–10.5)

## 2016-12-28 LAB — HEPATIC FUNCTION PANEL
ALT: 30 U/L (ref 0–53)
AST: 24 U/L (ref 0–37)
Albumin: 4.3 g/dL (ref 3.5–5.2)
Alkaline Phosphatase: 67 U/L (ref 39–117)
Bilirubin, Direct: 0.1 mg/dL (ref 0.0–0.3)
Total Bilirubin: 0.5 mg/dL (ref 0.2–1.2)
Total Protein: 6.9 g/dL (ref 6.0–8.3)

## 2016-12-28 LAB — LIPID PANEL
Cholesterol: 148 mg/dL (ref 0–200)
HDL: 38.2 mg/dL — ABNORMAL LOW (ref >39.00)
LDL Cholesterol: 85 mg/dL (ref 0–99)
NonHDL: 109.36
Total CHOL/HDL Ratio: 4
Triglycerides: 124 mg/dL (ref 0.0–149.0)
VLDL: 24.8 mg/dL (ref 0.0–40.0)

## 2016-12-28 LAB — PSA: PSA: 3.61 ng/mL (ref 0.10–4.00)

## 2016-12-28 LAB — TSH: TSH: 0.93 u[IU]/mL (ref 0.35–4.50)

## 2016-12-28 MED ORDER — NAPROXEN 500 MG PO TABS: 500.0000 mg | ORAL_TABLET | Freq: Two times a day (BID) | ORAL | 3 refills | Status: DC

## 2016-12-28 MED ORDER — LISINOPRIL 20 MG PO TABS: 20.0000 mg | ORAL_TABLET | Freq: Every day | ORAL | 3 refills | Status: DC

## 2016-12-28 MED ORDER — IBUPROFEN 800 MG PO TABS: ORAL_TABLET | ORAL | 3 refills | Status: DC

## 2016-12-28 MED ORDER — ATORVASTATIN CALCIUM 20 MG PO TABS: 20.0000 mg | ORAL_TABLET | Freq: Every day | ORAL | 3 refills | Status: DC

## 2016-12-28 NOTE — Progress Notes (Signed)
Subjective:     Patient ID: Damon [name], male   DOB: 05/10/1960, 56 y.o.   MRN: [id number]  HPI Patient is here for complete physical. He has history of hypertension hyperlipidemia. Nonsmoker. Exercises regularly. Has had some gradual weight gain in recent years. Still needs flu vaccine. Tetanus up-to-date. Patient also requesting new shingles vaccine.  Increased PSA velocity last year. Was seen by urologist and they had no concerns. Denies any BPH symptoms.  Family history reviewed. Mother has advanced Alzheimer's dementia.  Past Medical History:  Diagnosis Date  . Allergy    seasonal   . Depression    situational   . Heart murmur    hx of in childhood   . Hyperlipidemia   . Hypertension   . Injury of tendon of long head of right biceps    January 2016   Past Surgical History:  Procedure Laterality Date  . APPENDECTOMY    . colonscopy      removed polyps  . MOUTH SURGERY     wisdom teeth removed;root canal - 03/2014  . TONSILLECTOMY  1995  . TONSILLECTOMY      reports that he quit smoking about 21 years ago. His smoking use included cigarettes. He has a 34.50 pack-year smoking history. he has never used smokeless tobacco. He reports that he drinks alcohol. He reports that he does not use drugs. family history includes Cancer in his other and paternal grandfather; Depression in his other; Heart disease in his other and paternal grandfather; Hyperlipidemia in his other; Hypertension in his other; Stroke in his other. Allergies  Allergen Reactions  . Poison Ivy Extract [Poison Ivy Extract] Itching and Rash  . Poison Oak Extract [Poison Oak Extract] Itching and Rash     Review of Systems  Constitutional: Negative for activity change, appetite change, fatigue and fever.  HENT: Negative for congestion, ear pain and trouble swallowing.   Eyes: Negative for pain and visual disturbance.  Respiratory: Negative for cough, shortness of breath and wheezing.   Cardiovascular: Negative  for chest pain and palpitations.  Gastrointestinal: Negative for abdominal distention, abdominal pain, blood in stool, constipation, diarrhea, nausea, rectal pain and vomiting.  Endocrine: Negative for polydipsia and polyuria.  Genitourinary: Negative for dysuria, hematuria and testicular pain.  Musculoskeletal: Negative for arthralgias and joint swelling.  Skin: Negative for rash.  Neurological: Negative for dizziness, syncope and headaches.  Hematological: Negative for adenopathy.  Psychiatric/Behavioral: Negative for confusion and dysphoric mood.       Objective:   Physical Exam  Constitutional: He is oriented to person, place, and time. He appears well-developed and well-nourished. No distress.  HENT:  Head: Normocephalic and atraumatic.  Right Ear: External ear normal.  Left Ear: External ear normal.  Mouth/Throat: Oropharynx is clear and moist.  Eyes: Conjunctivae and EOM are normal. Pupils are equal, round, and reactive to light.  Neck: Normal range of motion. Neck supple. No thyromegaly present.  Cardiovascular: Normal rate, regular rhythm and normal heart sounds.  No murmur heard. Pulmonary/Chest: No respiratory distress. He has no wheezes. He has no rales.  Abdominal: Soft. Bowel sounds are normal. He exhibits no distension and no mass. There is no tenderness. There is no rebound and no guarding.  Genitourinary:  Genitourinary Comments: Prostate is normal in size. Symmetric with no nodules.  Musculoskeletal: He exhibits no edema.  Lymphadenopathy:    He has no cervical adenopathy.  Neurological: He is alert and oriented to person, place, and time. He displays normal reflexes.  No cranial nerve deficit.  Skin: No rash noted.  Psychiatric: He has a normal mood and affect.       Assessment:     Complete physical. Several issues addressed as below    Plan:     -Flu vaccine given -Patient requesting new shingles vaccine and he will be placed on waiting list for  that -Obtain screening lab work -We recommend he try to lose some weight by scaling back sugars and starches. Continue regular exercise habits.  Kristian CoveyBruce W Olena Willy MD [facility]

## 2016-12-29 ENCOUNTER — Encounter: Payer: Self-pay | Admitting: Family Medicine

## 2017-01-04 DIAGNOSIS — F4320 Adjustment disorder, unspecified: Secondary | ICD-10-CM | POA: Diagnosis not present

## 2017-01-18 ENCOUNTER — Ambulatory Visit (INDEPENDENT_AMBULATORY_CARE_PROVIDER_SITE_OTHER): Payer: BLUE CROSS/BLUE SHIELD | Admitting: *Deleted

## 2017-01-18 DIAGNOSIS — Z23 Encounter for immunization: Secondary | ICD-10-CM | POA: Diagnosis not present

## 2017-01-18 NOTE — Progress Notes (Signed)
Per orders of Dr. Caryl NeverBurchette, injection of Shingrix given by Starla Linkarolyn J Merna Baldi. Patient tolerated injection well.  Starla Linkarolyn J Crickett Abbett, RN

## 2017-01-27 DIAGNOSIS — F4320 Adjustment disorder, unspecified: Secondary | ICD-10-CM | POA: Diagnosis not present

## 2017-02-03 DIAGNOSIS — F4320 Adjustment disorder, unspecified: Secondary | ICD-10-CM | POA: Diagnosis not present

## 2017-02-24 DIAGNOSIS — F4320 Adjustment disorder, unspecified: Secondary | ICD-10-CM | POA: Diagnosis not present

## 2017-02-28 DIAGNOSIS — F411 Generalized anxiety disorder: Secondary | ICD-10-CM | POA: Diagnosis not present

## 2017-03-10 DIAGNOSIS — F4320 Adjustment disorder, unspecified: Secondary | ICD-10-CM | POA: Diagnosis not present

## 2017-03-21 ENCOUNTER — Ambulatory Visit (INDEPENDENT_AMBULATORY_CARE_PROVIDER_SITE_OTHER): Payer: BLUE CROSS/BLUE SHIELD

## 2017-03-21 DIAGNOSIS — Z23 Encounter for immunization: Secondary | ICD-10-CM

## 2017-03-24 DIAGNOSIS — F4320 Adjustment disorder, unspecified: Secondary | ICD-10-CM | POA: Diagnosis not present

## 2017-04-14 DIAGNOSIS — F4320 Adjustment disorder, unspecified: Secondary | ICD-10-CM | POA: Diagnosis not present

## 2017-04-28 DIAGNOSIS — F4320 Adjustment disorder, unspecified: Secondary | ICD-10-CM | POA: Diagnosis not present

## 2017-05-02 DIAGNOSIS — F411 Generalized anxiety disorder: Secondary | ICD-10-CM | POA: Diagnosis not present

## 2017-05-12 DIAGNOSIS — F4320 Adjustment disorder, unspecified: Secondary | ICD-10-CM | POA: Diagnosis not present

## 2017-06-02 DIAGNOSIS — F4320 Adjustment disorder, unspecified: Secondary | ICD-10-CM | POA: Diagnosis not present

## 2017-06-23 DIAGNOSIS — F4320 Adjustment disorder, unspecified: Secondary | ICD-10-CM | POA: Diagnosis not present

## 2017-07-07 DIAGNOSIS — F4320 Adjustment disorder, unspecified: Secondary | ICD-10-CM | POA: Diagnosis not present

## 2017-07-25 DIAGNOSIS — F411 Generalized anxiety disorder: Secondary | ICD-10-CM | POA: Diagnosis not present

## 2017-07-27 DIAGNOSIS — F4320 Adjustment disorder, unspecified: Secondary | ICD-10-CM | POA: Diagnosis not present

## 2017-08-11 DIAGNOSIS — F4320 Adjustment disorder, unspecified: Secondary | ICD-10-CM | POA: Diagnosis not present

## 2017-08-22 DIAGNOSIS — F4320 Adjustment disorder, unspecified: Secondary | ICD-10-CM | POA: Diagnosis not present

## 2017-09-01 DIAGNOSIS — F4320 Adjustment disorder, unspecified: Secondary | ICD-10-CM | POA: Diagnosis not present

## 2017-09-06 DIAGNOSIS — F4320 Adjustment disorder, unspecified: Secondary | ICD-10-CM | POA: Diagnosis not present

## 2017-09-29 DIAGNOSIS — F4320 Adjustment disorder, unspecified: Secondary | ICD-10-CM | POA: Diagnosis not present

## 2017-10-06 DIAGNOSIS — F4320 Adjustment disorder, unspecified: Secondary | ICD-10-CM | POA: Diagnosis not present

## 2017-10-20 DIAGNOSIS — F4320 Adjustment disorder, unspecified: Secondary | ICD-10-CM | POA: Diagnosis not present

## 2017-10-30 DIAGNOSIS — F4320 Adjustment disorder, unspecified: Secondary | ICD-10-CM | POA: Diagnosis not present

## 2017-11-09 ENCOUNTER — Encounter: Payer: Self-pay | Admitting: Sports Medicine

## 2017-11-09 ENCOUNTER — Ambulatory Visit (INDEPENDENT_AMBULATORY_CARE_PROVIDER_SITE_OTHER): Payer: BLUE CROSS/BLUE SHIELD | Admitting: Sports Medicine

## 2017-11-09 ENCOUNTER — Ambulatory Visit
Admission: RE | Admit: 2017-11-09 | Discharge: 2017-11-09 | Disposition: A | Discharge status: Home / Self Care | Payer: BLUE CROSS/BLUE SHIELD | Source: Ambulatory Visit | Attending: Sports Medicine | Admitting: Sports Medicine

## 2017-11-09 VITALS — BP 154/82 | Ht 69.0 in | Wt 170.0 lb

## 2017-11-09 DIAGNOSIS — M545 Low back pain, unspecified: Secondary | ICD-10-CM

## 2017-11-09 DIAGNOSIS — M47816 Spondylosis without myelopathy or radiculopathy, lumbar region: Secondary | ICD-10-CM | POA: Diagnosis not present

## 2017-11-09 DIAGNOSIS — M7701 Medial epicondylitis, right elbow: Secondary | ICD-10-CM

## 2017-11-09 NOTE — Progress Notes (Signed)
  Damon SaasHarry S [name] - 57 y.o. male MRN [id number]  Date of birth: 02/02/1961    SUBJECTIVE:      Chief Complaint:/ HPI:  57 year old male presents with 2 complaints 1.  Patient reports low back pain.  He has a history of low back pain dating back many years from a skiing accident.  He had increased pain 3 months ago.  It gradually resolved.  He reaggravated it several weeks ago moving heavy boxes and toilets.  His pain is mostly right-sided and does not radiate.  Is generally a 6/10 and cramping and sometimes sharp.  He denies any midline pain.  He denies any radiation down his leg.  No numbness or tingling in the lower extremity.  He denies any weakness.  He takes ibuprofen 800 mg occasionally that is minimally helpful.  He denies any bowel/bladder symptoms.  No saddle anesthesia.  2.  Medial right elbow pain.  He is uncertain exactly when this started.  He denies any specific injury.  He notes tenderness over the medial condyle.  He reports pain with use of his hand particularly with gripping or wrist flexion.  He denies any numbness or tingling in the lower extremity.  He denies any decreased range of motion.  No overlying swelling or erythema.   ROS:     See HPI  PERTINENT  PMH / PSH FH / / SH:  Past Medical, Surgical, Social, and Family History Reviewed & Updated in the EMR.     OBJECTIVE: BP (!) 154/82   Ht 5\' 9"  (1.753 m)   Wt 170 lb (77.1 kg)   BMI 25.10 kg/m   Physical Exam:  Vital signs are reviewed.  GEN: Alert and oriented, NAD Pulm: Breathing unlabored PSY: normal mood, congruent affect  MSK: Lumbar spine: - Inspection: no gross deformity or asymmetry, swelling or ecchymosis - Palpation: Mild tenderness over the right > left lumbar paraspinal musculature.  No spinous process tenderness - ROM: full active ROM of the lumbar spine in flexion and extension without significant pain - Strength: 5/5 strength of lower extremity in L4-S1 nerve root distributions b/l - Neuro:  sensation intact in the L4-S1 nerve root distribution b/l, 2+ L4 and S1 reflexes - Special testing: Negative straight leg raise  Right elbow: Inspection: No obvious deformity, swelling, erythema Palpation: Tenderness to palpation over the medial epicondyle ROM: Full range of motion of the wrist and elbow Strength: 5/5 strength.  Pain reproduced with resisted flexion and ulnar deviation of the wrist Neurovascular: N/V intact        ASSESSMENT & PLAN:  1.  Acute exacerbation of chronic low back pain- this is likely due to muscular strain.  There are no radicular symptoms or focal neurologic deficits. -We will refer to physical therapy to address core strengthening -Patient provided with home exercises for stretching of the low back - We will obtain lumbar x-rays.  Will contact the patient if there are any concerning findings on x-ray -Follow-up in 6 weeks   2.  Medial epicondylitis - Patient provided with counterforce strap -Home exercises to treat medial epicondylitis - Continue ibuprofen as needed - Follow-up in 6 weeks

## 2017-11-13 DIAGNOSIS — F4320 Adjustment disorder, unspecified: Secondary | ICD-10-CM | POA: Diagnosis not present

## 2017-11-17 ENCOUNTER — Other Ambulatory Visit: Payer: Self-pay

## 2017-11-17 ENCOUNTER — Emergency Department (HOSPITAL_BASED_OUTPATIENT_CLINIC_OR_DEPARTMENT_OTHER): Payer: BLUE CROSS/BLUE SHIELD

## 2017-11-17 ENCOUNTER — Ambulatory Visit: Payer: Self-pay

## 2017-11-17 ENCOUNTER — Encounter (HOSPITAL_BASED_OUTPATIENT_CLINIC_OR_DEPARTMENT_OTHER): Payer: Self-pay | Admitting: Emergency Medicine

## 2017-11-17 ENCOUNTER — Inpatient Hospital Stay (HOSPITAL_BASED_OUTPATIENT_CLINIC_OR_DEPARTMENT_OTHER)
Admission: EM | Admit: 2017-11-17 | Discharge: 2017-11-21 | DRG: 247 | Disposition: A | Discharge status: Home / Self Care | Payer: BLUE CROSS/BLUE SHIELD | Attending: Cardiology | Admitting: Cardiology

## 2017-11-17 DIAGNOSIS — Z79899 Other long term (current) drug therapy: Secondary | ICD-10-CM | POA: Diagnosis not present

## 2017-11-17 DIAGNOSIS — I214 Non-ST elevation (NSTEMI) myocardial infarction: Principal | ICD-10-CM | POA: Diagnosis present

## 2017-11-17 DIAGNOSIS — Z86010 Personal history of colonic polyps: Secondary | ICD-10-CM | POA: Diagnosis not present

## 2017-11-17 DIAGNOSIS — Z87891 Personal history of nicotine dependence: Secondary | ICD-10-CM | POA: Diagnosis not present

## 2017-11-17 DIAGNOSIS — E782 Mixed hyperlipidemia: Secondary | ICD-10-CM | POA: Diagnosis not present

## 2017-11-17 DIAGNOSIS — Z8249 Family history of ischemic heart disease and other diseases of the circulatory system: Secondary | ICD-10-CM

## 2017-11-17 DIAGNOSIS — Z91048 Other nonmedicinal substance allergy status: Secondary | ICD-10-CM | POA: Diagnosis not present

## 2017-11-17 DIAGNOSIS — Z791 Long term (current) use of non-steroidal anti-inflammatories (NSAID): Secondary | ICD-10-CM

## 2017-11-17 DIAGNOSIS — I10 Essential (primary) hypertension: Secondary | ICD-10-CM | POA: Diagnosis present

## 2017-11-17 DIAGNOSIS — I2510 Atherosclerotic heart disease of native coronary artery without angina pectoris: Secondary | ICD-10-CM | POA: Diagnosis not present

## 2017-11-17 DIAGNOSIS — E785 Hyperlipidemia, unspecified: Secondary | ICD-10-CM | POA: Diagnosis present

## 2017-11-17 DIAGNOSIS — Z823 Family history of stroke: Secondary | ICD-10-CM

## 2017-11-17 DIAGNOSIS — Z9049 Acquired absence of other specified parts of digestive tract: Secondary | ICD-10-CM | POA: Diagnosis not present

## 2017-11-17 DIAGNOSIS — R945 Abnormal results of liver function studies: Secondary | ICD-10-CM

## 2017-11-17 DIAGNOSIS — Z8042 Family history of malignant neoplasm of prostate: Secondary | ICD-10-CM

## 2017-11-17 DIAGNOSIS — R0789 Other chest pain: Secondary | ICD-10-CM | POA: Diagnosis present

## 2017-11-17 DIAGNOSIS — J302 Other seasonal allergic rhinitis: Secondary | ICD-10-CM | POA: Diagnosis present

## 2017-11-17 DIAGNOSIS — Z9089 Acquired absence of other organs: Secondary | ICD-10-CM | POA: Diagnosis not present

## 2017-11-17 DIAGNOSIS — Z8349 Family history of other endocrine, nutritional and metabolic diseases: Secondary | ICD-10-CM | POA: Diagnosis not present

## 2017-11-17 DIAGNOSIS — Z818 Family history of other mental and behavioral disorders: Secondary | ICD-10-CM | POA: Diagnosis not present

## 2017-11-17 DIAGNOSIS — R079 Chest pain, unspecified: Secondary | ICD-10-CM | POA: Diagnosis not present

## 2017-11-17 DIAGNOSIS — F4321 Adjustment disorder with depressed mood: Secondary | ICD-10-CM | POA: Diagnosis not present

## 2017-11-17 DIAGNOSIS — Z955 Presence of coronary angioplasty implant and graft: Secondary | ICD-10-CM

## 2017-11-17 DIAGNOSIS — R7989 Other specified abnormal findings of blood chemistry: Secondary | ICD-10-CM

## 2017-11-17 LAB — COMPREHENSIVE METABOLIC PANEL
ALT: 45 U/L — ABNORMAL HIGH (ref 0–44)
AST: 124 U/L — ABNORMAL HIGH (ref 15–41)
Albumin: 4.6 g/dL (ref 3.5–5.0)
Alkaline Phosphatase: 85 U/L (ref 38–126)
Anion gap: 9 (ref 5–15)
BUN: 16 mg/dL (ref 6–20)
CO2: 27 mmol/L (ref 22–32)
Calcium: 9.4 mg/dL (ref 8.9–10.3)
Chloride: 105 mmol/L (ref 98–111)
Creatinine, Ser: 0.93 mg/dL (ref 0.61–1.24)
GFR calc Af Amer: >60 mL/min (ref >60)
GFR calc non Af Amer: >60 mL/min (ref >60)
Glucose, Bld: 93 mg/dL (ref 70–99)
Potassium: 4 mmol/L (ref 3.5–5.1)
Sodium: 141 mmol/L (ref 135–145)
Total Bilirubin: 0.3 mg/dL (ref 0.3–1.2)
Total Protein: 7.9 g/dL (ref 6.5–8.1)

## 2017-11-17 LAB — TROPONIN I: Troponin I: 5.5 ng/mL (ref <0.03)

## 2017-11-17 LAB — CBC
HCT: 44.5 % (ref 39.0–52.0)
Hemoglobin: 15 g/dL (ref 13.0–17.0)
MCH: 29 pg (ref 26.0–34.0)
MCHC: 33.7 g/dL (ref 30.0–36.0)
MCV: 86.1 fL (ref 78.0–100.0)
Platelets: 264 10*3/uL (ref 150–400)
RBC: 5.17 MIL/uL (ref 4.22–5.81)
RDW: 13.7 % (ref 11.5–15.5)
WBC: 12.8 10*3/uL — ABNORMAL HIGH (ref 4.0–10.5)

## 2017-11-17 LAB — LIPASE, BLOOD: Lipase: 27 U/L (ref 11–51)

## 2017-11-17 MED ORDER — NITROGLYCERIN 0.4 MG SL SUBL
0.4000 mg | SUBLINGUAL_TABLET | SUBLINGUAL | Status: AC | PRN
  Administered 2017-11-17 (×3): 0.4 mg via SUBLINGUAL
  Filled 2017-11-17: qty 1

## 2017-11-17 MED ORDER — ATORVASTATIN CALCIUM 80 MG PO TABS
80.0000 mg | ORAL_TABLET | Freq: Every day | ORAL | Status: DC
  Administered 2017-11-17 – 2017-11-20 (×4): 80 mg via ORAL
  Filled 2017-11-17 (×4): qty 1

## 2017-11-17 MED ORDER — NITROGLYCERIN 0.4 MG SL SUBL: 0.4000 mg | SUBLINGUAL_TABLET | SUBLINGUAL | Status: DC | PRN

## 2017-11-17 MED ORDER — CLONAZEPAM 0.5 MG PO TABS
0.5000 mg | ORAL_TABLET | Freq: Two times a day (BID) | ORAL | Status: DC | PRN
  Administered 2017-11-17 – 2017-11-20 (×6): 0.5 mg via ORAL
  Filled 2017-11-17 (×6): qty 1

## 2017-11-17 MED ORDER — LISINOPRIL 10 MG PO TABS
20.0000 mg | ORAL_TABLET | Freq: Every day | ORAL | Status: DC
  Administered 2017-11-17 – 2017-11-20 (×4): 20 mg via ORAL
  Filled 2017-11-17 (×3): qty 2
  Filled 2017-11-17: qty 4
  Filled 2017-11-17: qty 2

## 2017-11-17 MED ORDER — ONDANSETRON HCL 4 MG/2ML IJ SOLN: 4.0000 mg | Freq: Four times a day (QID) | INTRAMUSCULAR | Status: DC | PRN

## 2017-11-17 MED ORDER — ASPIRIN 81 MG PO CHEW
324.0000 mg | CHEWABLE_TABLET | Freq: Once | ORAL | Status: AC
  Administered 2017-11-17: 324 mg via ORAL
  Filled 2017-11-17: qty 4

## 2017-11-17 MED ORDER — HEPARIN (PORCINE) IN NACL 100-0.45 UNIT/ML-% IJ SOLN
1400.0000 [IU]/h | INTRAMUSCULAR | Status: DC
  Administered 2017-11-17: 900 [IU]/h via INTRAVENOUS
  Administered 2017-11-19 – 2017-11-20 (×2): 1400 [IU]/h via INTRAVENOUS
  Filled 2017-11-17 (×5): qty 250

## 2017-11-17 MED ORDER — ASPIRIN EC 81 MG PO TBEC
81.0000 mg | DELAYED_RELEASE_TABLET | Freq: Every day | ORAL | Status: DC
  Administered 2017-11-18 – 2017-11-21 (×3): 81 mg via ORAL
  Filled 2017-11-17 (×3): qty 1

## 2017-11-17 MED ORDER — ACETAMINOPHEN 325 MG PO TABS: 650.0000 mg | ORAL_TABLET | ORAL | Status: DC | PRN

## 2017-11-17 MED ORDER — HEPARIN BOLUS VIA INFUSION
4000.0000 [IU] | Freq: Once | INTRAVENOUS | Status: AC
  Administered 2017-11-17: 4000 [IU] via INTRAVENOUS

## 2017-11-17 MED ORDER — SODIUM CHLORIDE 0.9 % IV SOLN
INTRAVENOUS | Status: DC | PRN
  Administered 2017-11-17: 500 mL via INTRAVENOUS

## 2017-11-17 MED ORDER — LISINOPRIL 10 MG PO TABS: 20.0000 mg | ORAL_TABLET | Freq: Every day | ORAL | Status: DC

## 2017-11-17 NOTE — Telephone Encounter (Signed)
Called patient and advised to go to the ER. Patient verbalized an understanding and stated that he was in [facility] so I advised to go to the [facility]. Patient verbalized an understanding.

## 2017-11-17 NOTE — ED Provider Notes (Signed)
[facility] EMERGENCY DEPARTMENT Provider Note   CSN: [id number] Arrival date & time: 11/17/17  1611     History   Chief Complaint Chief Complaint  Patient presents with  . Chest Pain    HPI Damon [name] is a 57 y.o. male.  57yo w/ PMH including HTN, HLD who p/w chest pain. 2 days ago in the early morning, he was awakened from sleep by central, non-radiating chest pressure. He got up out of bed, drank water, and noted that the pain improved. It has been there constantly since it began but it is generally very minimal throughout the day. He had another exacerbation of the pain around 4am this morning that again improved. No SOB, N/V, or diaphoresis. No association with exertion. Pain is noticeable when he lays flat, much improved sitting up. 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 He exercised on the elliptical this morning with no problems. He had cold symptoms last week including productive cough that have almost completely resolved.   FH negative for heart disease. No tobacco or cocaine use.  The history is provided by the patient.  Chest Pain      Past Medical History:  Diagnosis Date  . Allergy    seasonal   . Depression    situational   . Heart murmur    hx of in childhood   . Hyperlipidemia   . Hypertension   . Injury of tendon of long head of right biceps    January 2016    Patient Active Problem List   Diagnosis Date Noted  . Olecranon bursitis, right elbow 03/11/2016  . Tendinitis of left rotator cuff 04/23/2015  . History of colonic polyps 11/28/2014  . Rupture of long head biceps tendon 07/16/2014  . Reducible left inguinal hernia 06/12/2014  . HYPERLIPIDEMIA 11/03/2008  . DEPRESSION 11/03/2008  . HYPERTENSION 11/03/2008    Past Surgical History:  Procedure Laterality Date  . APPENDECTOMY    . COLONOSCOPY N/A 11/28/2014   Procedure: COLONOSCOPY;   Surgeon: Louis Meckel, MD;  Location: [facility];  Service: Endoscopy;  Laterality: N/A;  . colonscopy      removed polyps  . INGUINAL HERNIA REPAIR Left 06/12/2014   Procedure: LEFT INGUINAL HERNIA REPAIR WITH MESH;  Surgeon: Darnell Level, MD;  Location: [facility];  Service: General;  Laterality: Left;  . INSERTION OF MESH Left 06/12/2014   Procedure: INSERTION OF MESH;  Surgeon: Darnell Level, MD;  Location: [facility];  Service: General;  Laterality: Left;  . MOUTH SURGERY     wisdom teeth removed;root canal - 03/2014  . TONSILLECTOMY  1995  . TONSILLECTOMY          Home Medications    Prior to Admission medications   Medication Sig Start Date End Date Taking? Authorizing Provider  Lisdexamfetamine Dimesylate (VYVANSE PO) Take by mouth.   Yes [provider]  atorvastatin (LIPITOR) 20 MG tablet Take 1 tablet (20 mg total) daily by mouth. 12/28/16   Burchette, Elberta Fortis, MD  cetirizine (ZYRTEC) 10 MG tablet Take 10 mg by mouth 2 (two) times daily.    [provider]  clonazePAM (KLONOPIN) 0.5 MG tablet Take 0.5 mg by mouth 2 (two) times daily as needed for anxiety.    [provider]  Hyprom-Naphaz-Polysorb-Zn Sulf (CLEAR EYES COMPLETE) SOLN Apply 1 drop to eye daily as needed (Allergies).    [provider]  ibuprofen (ADVIL,MOTRIN) 800 MG tablet TAKE 1 TABLET BY MOUTH EVERY 8 HOURS AS NEEDED PAIN 12/28/16   Burchette, Elberta Fortis, MD  lisinopril (PRINIVIL,ZESTRIL) 20 MG tablet Take 1 tablet (20 mg total) daily by mouth. 12/28/16   Burchette, Elberta Fortis, MD  naproxen (NAPROSYN) 500 MG tablet Take 1 tablet (500 mg total) 2 (two) times daily with a meal by mouth. 12/28/16   Burchette, Elberta Fortis, MD    Family History Family History  Problem Relation Age of Onset  . Depression Other   . Hyperlipidemia Other   . Hypertension Other   . Cancer Other        prostate  . Stroke Other   . Heart disease Other   . Cancer Paternal Grandfather   . Heart disease Paternal  Grandfather     Social History Social History   Tobacco Use  . Smoking status: Former Smoker    Packs/day: 1.50    Years: 23.00    Pack years: 34.50    Types: Cigarettes    Last attempt to quit: 02/22/1995    Years since quitting: 22.7  . Smokeless tobacco: Never Used  Substance Use Topics  . Alcohol use: Yes    Comment: occas  . Drug use: No     Allergies   Poison ivy extract [poison ivy extract] and Poison oak extract [poison oak extract]   Review of Systems Review of Systems  Cardiovascular: Positive for chest pain.  All other systems reviewed and are negative except that which was mentioned in HPI    Physical Exam Updated Vital Signs BP (!) 149/88 (BP Location: Right Arm)   Pulse 66   Temp 98 F (36.7 C) (Oral)   Resp 18   Ht 5\' 9"  (1.753 m)   Wt 74.8 kg   SpO2 98%   BMI 24.37 kg/m   Physical Exam  Constitutional: He is oriented to person, place, and time. He appears well-developed and well-nourished. No distress.  HENT:  Head: Normocephalic and atraumatic.  Moist mucous membranes  Eyes: Conjunctivae are normal.  Neck: Neck supple.  Cardiovascular: Normal rate, regular rhythm and normal heart sounds.  No murmur heard. Pulmonary/Chest: Effort normal and breath sounds normal.  Abdominal: Soft. Bowel sounds are normal. He exhibits no distension. There is no tenderness.  Musculoskeletal: He exhibits no edema.  No chest wall tenderness  Neurological: He is alert and oriented to person, place, and time.  Fluent speech  Skin: Skin is warm and dry.  Psychiatric: He has a normal mood and affect. Judgment normal.  Nursing note and vitals reviewed.    ED Treatments / Results  Labs (all labs ordered are listed, but only abnormal results are displayed) Labs Reviewed  COMPREHENSIVE METABOLIC PANEL - Abnormal; Notable for the following components:      Result Value   AST 124 (*)    ALT 45 (*)    All other components within normal limits  TROPONIN I -  Abnormal; Notable for the following components:   Troponin I 5.50 (*)    All other components within normal limits  CBC - Abnormal; Notable for the following components:   WBC 12.8 (*)    All other components within normal limits  LIPASE, BLOOD  HEPARIN LEVEL (UNFRACTIONATED)  CBC    EKG EKG Interpretation  Date/Time:  Friday November 17 2017 16:18:12 EDT Ventricular Rate:  79 PR Interval:    QRS Duration: 98 QT Interval:  370 QTC Calculation: 425 R Axis:  98 Text Interpretation:  Sinus rhythm Borderline right axis deviation similar to previous, T waves slightly less peaked Confirmed by Frederick Peers [phone]) on 11/17/2017 4:26:30 PM   Radiology Dg Chest 2 View  Result Date: 11/17/2017 CLINICAL DATA:  Two days of chest pain, worse when lying flat EXAM: CHEST - 2 VIEW COMPARISON:  None. FINDINGS: There is no focal parenchymal opacity. There is no pleural effusion or pneumothorax. The heart and mediastinal contours are unremarkable. The osseous structures are unremarkable. IMPRESSION: No active cardiopulmonary disease. Electronically Signed   By: Elige Ko   On: 11/17/2017 17:41    Procedures .Critical Care Performed by: Laurence Spates, MD Authorized by: Laurence Spates, MD   Critical care provider statement:    Critical care time (minutes):  30   Critical care time was exclusive of:  Separately billable procedures and treating other patients   Critical care was necessary to treat or prevent imminent or life-threatening deterioration of the following conditions:  Cardiac failure   Critical care was time spent personally by me on the following activities:  Development of treatment plan with patient or surrogate, discussions with consultants, evaluation of patient's response to treatment, examination of patient, obtaining history from patient or surrogate, ordering and performing treatments and interventions, ordering and review of laboratory studies, ordering and  review of radiographic studies and re-evaluation of patient's condition   (including critical care time)  Medications Ordered in ED Medications  nitroGLYCERIN (NITROSTAT) SL tablet 0.4 mg (0.4 mg Sublingual Given 11/17/17 1808)  heparin bolus via infusion 4,000 Units (has no administration in time range)  heparin ADULT infusion 100 units/mL (25000 units/216mL sodium chloride 0.45%) (has no administration in time range)  0.9 %  sodium chloride infusion (has no administration in time range)  aspirin chewable tablet 324 mg (324 mg Oral Given 11/17/17 1803)     Initial Impression / Assessment and Plan / ED Course  I have reviewed the triage vital signs and the nursing notes.  Pertinent labs & imaging results that were available during my care of the patient were reviewed by me and considered in my medical decision making (see chart for details).     Well-appearing, pleasant, and comfortable on exam.  Mild hypertension noted.  He reported his chest pain was present but very minimal.  EKG without ST elevation, borderline depression in inferior leads but no STEMI criteria.  Chest x-ray negative acute.  Labs notable for troponin of 5, mildly elevated LFTs.  Gave aspirin and initiated a heparin drip.  Because his pain started 2 days ago, it is possible that his troponin is downtrending from an ischemic event.  Discussed with cardiology, Dr. Lucia Gaskins, who agreed with plan to admit to [facility]. PT will be transferred for cardiology evaluation.   Final Clinical Impressions(s) / ED Diagnoses   Final diagnoses:  NSTEMI (non-ST elevated myocardial infarction) [facility])  Elevated LFTs    ED Discharge Orders    None       Kaleah Hagemeister, Ambrose Finland, MD 11/17/17 1815

## 2017-11-17 NOTE — Progress Notes (Signed)
ANTICOAGULATION CONSULT NOTE  Pharmacy Consult for heparin Indication: chest pain/ACS  Heparin Dosing Weight: 74.8 kg  Labs: Recent Labs    11/17/17 1650  HGB 15.0  HCT 44.5  PLT 264  CREATININE 0.93  TROPONINI 5.50*    Assessment: 57 yom presenting with CP. Pharmacy consulted to dose heparin. Not on anticoagulation PTA. CBC wnl. No bleed documented.  Goal of Therapy:  Heparin level 0.3-0.7 units/ml Monitor platelets by anticoagulation protocol: Yes   Plan:  Heparin 4000 unit bolus Start heparin at 900 units/hr 6h heparin level Daily heparin level/CBC Monitor s/sx bleeding  Babs Bertin, PharmD, BCPS Clinical Pharmacist 11/17/2017 6:00 PM

## 2017-11-17 NOTE — H&P (Signed)
Cardiology History & Physical    Patient ID: KIONDRE GRENZ MRN: [id number], DOB: [birth year] Date of Encounter: 11/17/2017, 10:36 PM Primary Physician: Kristian Covey, MD  Chief Complaint: Chest pain   HPI: Damon [name] is a 57 y.o. male with history of hypertension, hyperlipidemia who presented his chest pain.  He was awoken from sleep 2 days ago with severe substernal chest pain.  The episode lasted 20 to 30 minutes, and improved after getting up out of bed and drinking some water.  He had another episode of chest discomfort that woke him from sleep at 4 AM this morning, that was not quite as severe as the initial episode.  He reports that he has not had any exacerbations of chest pain with mild to moderate exertion over the course the last 2 days.  He does note some positional quality to his pain, and notes the chest pain is worse while lying down.  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  He also had an upper respiratory infection last week, the symptoms interim.    Given his recurrent episodes of chest pain he presented to med [facility] for evaluation.  There his EKG showed sinus rhythm without acute ischemic changes.  His labs were notable for troponin of 5.5.  Started on heparin drip, loaded with aspirin, and sent to [facility] for further management.  Upon my interview, the patient is comfortable and his chest pain is almost completely resolved.     Past Medical History:  Diagnosis Date  . Allergy    seasonal   . Depression    situational   . Heart murmur    hx of in childhood   . Hyperlipidemia   . Hypertension   . Injury of tendon of long head of right biceps    January 2016     Surgical History:  Past Surgical History:  Procedure Laterality Date  . APPENDECTOMY    . COLONOSCOPY N/A 11/28/2014   Procedure: COLONOSCOPY;  Surgeon: Louis Meckel, MD;  Location: [facility]  ENDOSCOPY;  Service: Endoscopy;  Laterality: N/A;  . colonscopy      removed polyps  . INGUINAL HERNIA REPAIR Left 06/12/2014   Procedure: LEFT INGUINAL HERNIA REPAIR WITH MESH;  Surgeon: Darnell Level, MD;  Location: [facility] ORS;  Service: General;  Laterality: Left;  . INSERTION OF MESH Left 06/12/2014   Procedure: INSERTION OF MESH;  Surgeon: Darnell Level, MD;  Location: [facility] ORS;  Service: General;  Laterality: Left;  . MOUTH SURGERY     wisdom teeth removed;root canal - 03/2014  . TONSILLECTOMY  1995  . TONSILLECTOMY       Home Meds: Prior to Admission medications   Medication Sig Start Date End Date Taking? Authorizing Provider  Lisdexamfetamine Dimesylate (VYVANSE PO) Take by mouth.   Yes [provider]  atorvastatin (LIPITOR) 20 MG tablet Take 1 tablet (20 mg total) daily by mouth. 12/28/16   Burchette, Elberta Fortis, MD  cetirizine (ZYRTEC) 10 MG tablet Take 10 mg by mouth 2 (two) times daily.    [provider]  clonazePAM (KLONOPIN) 0.5 MG tablet Take 0.5 mg by mouth 2 (two) times daily as needed for anxiety.    [provider]  Hyprom-Naphaz-Polysorb-Zn Sulf (CLEAR EYES COMPLETE) SOLN Apply 1 drop to eye daily as needed (Allergies).    [provider]  ibuprofen (ADVIL,MOTRIN) 800 MG tablet TAKE  1 TABLET BY MOUTH EVERY 8 HOURS AS NEEDED PAIN 12/28/16   Burchette, Elberta Fortis, MD  lisinopril (PRINIVIL,ZESTRIL) 20 MG tablet Take 1 tablet (20 mg total) daily by mouth. 12/28/16   Burchette, Elberta Fortis, MD  naproxen (NAPROSYN) 500 MG tablet Take 1 tablet (500 mg total) 2 (two) times daily with a meal by mouth. 12/28/16   Burchette, Elberta Fortis, MD    Allergies:  Allergies  Allergen Reactions  . Poison Ivy Extract [Poison Ivy Extract] Itching and Rash  . Poison Oak Extract [Poison Oak Extract] Itching and Rash    Social History   Socioeconomic History  . Marital status: Single    Spouse name: Not on file  . Number of children: Not on file  . Years of education: Not on  file  . Highest education level: Not on file  Occupational History  . Not on file  Social Needs  . Financial resource strain: Not on file  . Food insecurity:    Worry: Not on file    Inability: Not on file  . Transportation needs:    Medical: Not on file    Non-medical: Not on file  Tobacco Use  . Smoking status: Former Smoker    Packs/day: 1.50    Years: 23.00    Pack years: 34.50    Types: Cigarettes    Last attempt to quit: 02/22/1995    Years since quitting: 22.7  . Smokeless tobacco: Never Used  Substance and Sexual Activity  . Alcohol use: Yes    Comment: occas  . Drug use: No  . Sexual activity: Not on file  Lifestyle  . Physical activity:    Days per week: Not on file    Minutes per session: Not on file  . Stress: Not on file  Relationships  . Social connections:    Talks on phone: Not on file    Gets together: Not on file    Attends religious service: Not on file    Active member of club or organization: Not on file    Attends meetings of clubs or organizations: Not on file    Relationship status: Not on file  . Intimate partner violence:    Fear of current or ex partner: Not on file    Emotionally abused: Not on file    Physically abused: Not on file    Forced sexual activity: Not on file  Other Topics Concern  . Not on file  Social History Narrative  . Not on file     Family History  Problem Relation Age of Onset  . Depression Other   . Hyperlipidemia Other   . Hypertension Other   . Cancer Other        prostate  . Stroke Other   . Heart disease Other   . Cancer Paternal Grandfather   . Heart disease Paternal Grandfather     Review of Systems: All other systems reviewed and are otherwise negative except as noted above.  Labs:   Lab Results  Component Value Date   WBC 12.8 (H) 11/17/2017   HGB 15.0 11/17/2017   HCT 44.5 11/17/2017   MCV 86.1 11/17/2017   PLT 264 11/17/2017    Recent Labs  Lab 11/17/17 1650  NA 141  K 4.0  CL 105   CO2 27  BUN 16  CREATININE 0.93  CALCIUM 9.4  PROT 7.9  BILITOT 0.3  ALKPHOS 85  ALT 45*  AST 124*  GLUCOSE 93  Recent Labs    11/17/17 1650  TROPONINI 5.50*   Lab Results  Component Value Date   CHOL 148 12/28/2016   HDL 38.20 (L) 12/28/2016   LDLCALC 85 12/28/2016   TRIG 124.0 12/28/2016   No results found for: DDIMER  Radiology/Studies:  Dg Chest 2 View  Result Date: 11/17/2017 CLINICAL DATA:  Two days of chest pain, worse when lying flat EXAM: CHEST - 2 VIEW COMPARISON:  None. FINDINGS: There is no focal parenchymal opacity. There is no pleural effusion or pneumothorax. The heart and mediastinal contours are unremarkable. The osseous structures are unremarkable. IMPRESSION: No active cardiopulmonary disease. Electronically Signed   By: Elige Ko   On: 11/17/2017 17:41   Dg Lumbar Spine 2-3 Views  Result Date: 11/10/2017 CLINICAL DATA:  History of low back pain. EXAM: LUMBAR SPINE - 2-3 VIEW COMPARISON:  No recent. FINDINGS: No acute bony abnormality identified. No evidence of fracture. Diffuse degenerative change. IMPRESSION: Diffuse degenerative change.  No acute abnormality. Electronically Signed   By: Maisie Fus  Register   On: 11/10/2017 06:34   Wt Readings from Last 3 Encounters:  11/17/17 76.4 kg  11/09/17 77.1 kg  12/28/16 82.6 kg    EKG: Sinus rhythm, nonspecific ST and T wave changes.  Physical Exam: Blood pressure (!) 149/91, pulse 80, temperature 98.7 F (37.1 C), temperature source Oral, resp. rate 16, height 5\' 8"  (1.727 m), weight 76.4 kg, SpO2 99 %. Body mass index is 25.61 kg/m. General: Well developed, well nourished, in no acute distress. Head: Normocephalic, atraumatic, sclera non-icteric, no xanthomas, nares are without discharge.  Neck: Negative for carotid bruits. JVD not elevated. Lungs: Clear bilaterally to auscultation without wheezes, rales, or rhonchi. Breathing is unlabored. Heart: RRR with S1 S2. No murmurs, rubs, or gallops  appreciated. Abdomen: Soft, non-tender, non-distended with normoactive bowel sounds. No hepatomegaly. No rebound/guarding. No obvious abdominal masses. Msk:  Strength and tone appear normal for age. Extremities: No clubbing or cyanosis. No edema.  Distal pedal pulses are 2+ and equal bilaterally. Neuro: Alert and oriented X 3. No focal deficit. No facial asymmetry. Moves all extremities spontaneously. Psych:  Responds to questions appropriately with a normal affect.    Assessment and Plan  57 year old male with hypertension, hyperlipidemia, who presents with chest pain, found to have NSTEMI.  1.  NSTEMI: Nearly chest pain-free on arrival.  Plan to continue cycling enzymes, with eventual plan for cardiac catheterization.  Low threshold to proceed to Cath Lab on a more urgent basis if recurrent active chest pain.  Continue IV heparin drip and low-dose aspirin.  Echocardiogram is ordered for the morning.  2.  Hypertension: Mildly hypertensive upon initial presentation.  Continue home lisinopril.  3.  Hyperlipidemia: Increase statin dosing to high intensity atorvastatin.  Signed, Esmond Plants, MD 11/17/2017, 10:36 PM

## 2017-11-17 NOTE — ED Triage Notes (Signed)
Chest pain x 2 days.  States this woke him up out of his sleep on Wednesday and felt like "an elephant sitting on his chest".  After sitting up he states he felt better.  Reports this happened again this morning at 0400.  Reports this as a constant pain, denies shortness of breath, nausea

## 2017-11-17 NOTE — ED Notes (Signed)
Pt placed on 2L [place]

## 2017-11-17 NOTE — ED Notes (Signed)
Unable to give report to floor RN at this time- RN will call back

## 2017-11-17 NOTE — Telephone Encounter (Signed)
Phone call from pt.  Reported he awakened Wed. Night about 3:00 AM and felt like there was an elephant sitting on his chest and weakness in arms.  Reported the pain is continuous and describes as "dull"; rated at 5/10.  Stated he woke up again last night at 4:00 AM, and described again that it felt like an elephant was sitting on his chest.  Stated he has pain from his rib cage to his shoulders, up the middle of the chest.  Denied any radiation of pain into the neck, shoulder, or jaw.  Denied shortness of breath.  Denied nausea.  Reported he has been pushing himself the past few days.  Stated on Tues. 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  Questioned if he could have strained chest muscles? Denied any soreness of chest muscles when asked to push on anterior chest wall.  Also reported he is at the end of a 10 day Cold, and has been coughing up white to green mucus.  Stated he went to the gym this morning, because he felt he needed to work off some nervous energy, and reported his arms were weak, but otherwise he was able to do the "eliptical" x 30 min.  Denied any shortness of breath or increase in pain with the exercise. Stated "I didn't have to stop at all during the exercise."  The pt. stated "I feel fine."    Advised he should be evaluated in the ER to rule out cardiac cause for the pain.  The pt. questioned this advice and said he really just wants someone to see him in the office and check his vitals. Advised no available appts. At PCP office this afternoon, and the recommendation is to go to the ER.  The pt. Stated he is driving back from [facility], and would like to call back to see if there has been any cancellation.  Advised will send this note to the office for Dr. Caryl Never to give his recommendation.  Pt. Agreed with plan. Stated "If I don't hear back from anyone, then I'll go  to the ER."    Reason for Disposition . Chest pain lasts > 5 minutes (Exceptions: chest pain occurring > 3 days ago and now asymptomatic; same as previously diagnosed heartburn and has accompanying sour taste in mouth)  Answer Assessment - Initial Assessment Questions 1. LOCATION: "Where does it hurt?"       Dull pain from bottom of rib cage to shoulders; sore and achy and both arms  2. RADIATION: "Does the pain go anywhere else?" (e.g., into neck, jaw, arms, back)     Denied radiation to neck, jaw or shoulder 3. ONSET: "When did the chest pain begin?" (Minutes, hours or days)      Started 2 nights ago at 3:00 AM 4. PATTERN "Does the pain come and go, or has it been constant since it started?"  "Does it get worse with exertion?"      continuous 5. DURATION: "How long does it last" (e.g., seconds, minutes, hours)     continuous 6. SEVERITY: "How bad is the pain?"  (e.g., Scale 1-10; mild, moderate, or severe)    - MILD (1-3): doesn't interfere with normal activities     - MODERATE (4-7): interferes with normal activities or awakens from sleep    - SEVERE (8-10): excruciating pain, unable to  do any normal activities       5/10; it hurts most when laying down 7. CARDIAC RISK FACTORS: "Do you have any history of heart problems or risk factors for heart disease?" (e.g., prior heart attack, angina; high blood pressure, diabetes, being overweight, high cholesterol, smoking, or strong family history of heart disease)    On Lisinopril for BP, on Atorvastatin for cholesterol, denied diabetes, denied being overweight; denied family hx heart disease    8. PULMONARY RISK FACTORS: "Do you have any history of lung disease?"  (e.g., blood clots in lung, asthma, emphysema, birth control pills)     Denied any of above 9. 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 OTHER SYMPTOMS: "Do you have any other symptoms?" (e.g., dizziness,  nausea, vomiting, sweating, fever, difficulty breathing, cough)       Denied shortness of breath, nausea, sweating, or dizziness  Protocols used: CHEST PAIN-A-AH

## 2017-11-17 NOTE — ED Notes (Signed)
Patient transported to X-ray 

## 2017-11-17 NOTE — Telephone Encounter (Signed)
I do advise ER evaluation if still having chest pain.

## 2017-11-18 ENCOUNTER — Inpatient Hospital Stay (HOSPITAL_COMMUNITY): Payer: BLUE CROSS/BLUE SHIELD

## 2017-11-18 DIAGNOSIS — I214 Non-ST elevation (NSTEMI) myocardial infarction: Principal | ICD-10-CM

## 2017-11-18 LAB — BASIC METABOLIC PANEL
Anion gap: 8 (ref 5–15)
BUN: 12 mg/dL (ref 6–20)
CO2: 23 mmol/L (ref 22–32)
Calcium: 8.9 mg/dL (ref 8.9–10.3)
Chloride: 106 mmol/L (ref 98–111)
Creatinine, Ser: 0.92 mg/dL (ref 0.61–1.24)
GFR calc Af Amer: >60 mL/min (ref >60)
GFR calc non Af Amer: >60 mL/min (ref >60)
Glucose, Bld: 119 mg/dL — ABNORMAL HIGH (ref 70–99)
Potassium: 3.2 mmol/L — ABNORMAL LOW (ref 3.5–5.1)
Sodium: 137 mmol/L (ref 135–145)

## 2017-11-18 LAB — CBC
HCT: 45.3 % (ref 39.0–52.0)
Hemoglobin: 14.6 g/dL (ref 13.0–17.0)
MCH: 28.3 pg (ref 26.0–34.0)
MCHC: 32.2 g/dL (ref 30.0–36.0)
MCV: 87.8 fL (ref 78.0–100.0)
Platelets: 216 10*3/uL (ref 150–400)
RBC: 5.16 MIL/uL (ref 4.22–5.81)
RDW: 13.2 % (ref 11.5–15.5)
WBC: 13.9 10*3/uL — ABNORMAL HIGH (ref 4.0–10.5)

## 2017-11-18 LAB — LIPID PANEL
Cholesterol: 180 mg/dL (ref 0–200)
HDL: 48 mg/dL (ref >40)
LDL Cholesterol: 99 mg/dL (ref 0–99)
Total CHOL/HDL Ratio: 3.8 RATIO
Triglycerides: 165 mg/dL — ABNORMAL HIGH (ref <150)
VLDL: 33 mg/dL (ref 0–40)

## 2017-11-18 LAB — HEPARIN LEVEL (UNFRACTIONATED)
Heparin Unfractionated: 0.13 IU/mL — ABNORMAL LOW (ref 0.30–0.70)
Heparin Unfractionated: 0.31 IU/mL (ref 0.30–0.70)
Heparin Unfractionated: 0.23 IU/mL — ABNORMAL LOW (ref 0.30–0.70)
Heparin Unfractionated: 0.24 IU/mL — ABNORMAL LOW (ref 0.30–0.70)

## 2017-11-18 LAB — ECHOCARDIOGRAM COMPLETE
Height: 68 in
Weight: 2694.9 oz

## 2017-11-18 LAB — PROTIME-INR
INR: 0.96
Prothrombin Time: 12.7 seconds (ref 11.4–15.2)

## 2017-11-18 LAB — TROPONIN I
Troponin I: 9.25 ng/mL (ref <0.03)
Troponin I: 6.91 ng/mL (ref <0.03)

## 2017-11-18 LAB — HIV ANTIBODY (ROUTINE TESTING W REFLEX): HIV Screen 4th Generation wRfx: NONREACTIVE

## 2017-11-18 MED ORDER — HEPARIN BOLUS VIA INFUSION
1000.0000 [IU] | Freq: Once | INTRAVENOUS | Status: AC
  Administered 2017-11-18: 1000 [IU] via INTRAVENOUS
  Filled 2017-11-18: qty 1000

## 2017-11-18 NOTE — Progress Notes (Signed)
  Echocardiogram 2D Echocardiogram has been performed.  Damon [name] 11/18/2017, 11:41 AM

## 2017-11-18 NOTE — Progress Notes (Signed)
ANTICOAGULATION CONSULT NOTE  Pharmacy Consult for heparin Indication: chest pain/ACS  Heparin Dosing Weight: 74.8 kg  Labs: Recent Labs    11/17/17 1650 11/17/17 2220 11/18/17 0249  HGB 15.0  --  14.6  HCT 44.5  --  45.3  PLT 264  --  216  LABPROT  --   --  12.7  INR  --   --  0.96  HEPARINUNFRC  --   --  0.13*  CREATININE 0.93  --  0.92  TROPONINI 5.50* 9.25*  --     Assessment: Damon [name] presenting with CP. Pharmacy consulted to dose heparin. Not on anticoagulation PTA. CBC wnl. No bleed documented.  Heparin level 0.13 on 900 units/hr, no issue with infusion per RN  Goal of Therapy:  Heparin level 0.3-0.7 units/ml Monitor platelets by anticoagulation protocol: Yes   Plan:  Increase heparin rate 1100 units/hr F/u 6h heparin level at 1000 Daily heparin level/CBC Monitor s/sx bleeding  Bayard Hugger, PharmD, BCPS, BCPPS Clinical Pharmacist  Pager: [phone]   11/18/2017 4:08 AM

## 2017-11-18 NOTE — Progress Notes (Signed)
Progress Note  Patient Name: Damon [name] Date of Encounter: 11/18/2017  Primary Cardiologist: No primary care provider on file. New  Subjective   No angina today. Reports a personal and family history of Raynaud's sd. Was bad yesterday despite no cold exposure. Family history of HCM (mother's side of the family).  Inpatient Medications    Scheduled Meds: . aspirin EC  81 mg Oral Daily  . atorvastatin  80 mg Oral q1800  . lisinopril  20 mg Oral QHS   Continuous Infusions: . sodium chloride 500 mL (11/17/17 1814)  . heparin 1,100 Units/hr (11/18/17 0439)   PRN Meds: sodium chloride, acetaminophen, clonazePAM, nitroGLYCERIN, ondansetron (ZOFRAN) IV   Vital Signs    Vitals:   11/18/17 0500 11/18/17 0700 11/18/17 0900 11/18/17 1100  BP:      Pulse:      Resp: 19 15 (!) 23 (!) 21  Temp:      TempSrc:      SpO2:      Weight:      Height:        Intake/Output Summary (Last 24 hours) at 11/18/2017 1309 Last data filed at 11/18/2017 0800 Gross per 24 hour  Intake 210.1 ml  Output -  Net 210.1 ml   Filed Weights   11/17/17 1617 11/17/17 2041  Weight: 74.8 kg 76.4 kg    Telemetry    NSR - Personally Reviewed  ECG    NSR, early R/S transition V2, no ST-T changes - Personally Reviewed  Physical Exam  Appears lean and fit GEN: No acute distress.   Neck: No JVD Cardiac: RRR, no murmurs, rubs, or gallops.  Respiratory: Clear to auscultation bilaterally. GI: Soft, nontender, non-distended  MS: No edema; No deformity. Neuro:  Nonfocal  Psych: Normal affect   Labs    Chemistry Recent Labs  Lab 11/17/17 1650 11/18/17 0249  NA 141 137  K 4.0 3.2*  CL 105 106  CO2 27 23  GLUCOSE 93 119*  BUN 16 12  CREATININE 0.93 0.92  CALCIUM 9.4 8.9  PROT 7.9  --   ALBUMIN 4.6  --   AST 124*  --   ALT 45*  --   ALKPHOS 85  --   BILITOT 0.3  --   GFRNONAA >60 >60  GFRAA >60 >60  ANIONGAP 9 8     Hematology Recent Labs  Lab 11/17/17 1650 11/18/17 0249    WBC 12.8* 13.9*  RBC 5.17 5.16  HGB 15.0 14.6  HCT 44.5 45.3  MCV 86.1 87.8  MCH 29.0 28.3  MCHC 33.7 32.2  RDW 13.7 13.2  PLT 264 216    Cardiac Enzymes Recent Labs  Lab 11/17/17 1650 11/17/17 2220  TROPONINI 5.50* 9.25*   No results for input(s): TROPIPOC in the last 168 hours.   BNPNo results for input(s): BNP, PROBNP in the last 168 hours.   DDimer No results for input(s): DDIMER in the last 168 hours.   Radiology   Dg Chest 2 View  Result Date: 11/17/2017 CLINICAL DATA:  Two days of chest pain, worse when lying flat EXAM: CHEST - 2 VIEW COMPARISON:  None. FINDINGS: There is no focal parenchymal opacity. There is no pleural effusion or pneumothorax. The heart and mediastinal contours are unremarkable. The osseous structures are unremarkable. IMPRESSION: No active cardiopulmonary disease. Electronically Signed   By: Elige Ko   On: 11/17/2017 17:41    Cardiac Studies   ECHO 11/18/2017 ------------------------------------------------------------------- Study Conclusions  - Left  ventricle: The cavity size was normal. Systolic function was   mildly reduced. The estimated ejection fraction was in the range   of 45% to 50%. Hypokinesis of the mid-apical anterolateral and   inferolateral myocardium; consistent with ischemia or infarction   in the distribution of the left circumflex coronary artery or a   ramus intermedius artery. Left ventricular diastolic function   parameters were normal.  Patient Profile     57 y.o. male with NSTEMI and lateral wall motion abnormality,mild EF reduction.  Assessment & Plan    Area of wall motion abnormality is only moderate in size (ramus intermedius or proximal OM?), but larger than expected from the small troponin release.  Currently asymptomatic. Continue IV heparin. Plan cardiac catheterization and revascularization as appropriate on Monday, sooner if needed as an emergency. This procedure has been fully reviewed with the  patient and written informed consent has been obtained.   For questions or updates, please contact [facility] Please consult [email or web address] for contact info under        Signed, Thurmon Fair, MD  11/18/2017, 1:09 PM

## 2017-11-18 NOTE — Progress Notes (Signed)
CRITICAL VALUE ALERT  Critical Value:  Troponin 9.25  Date & Time Notied:  9/28  @ 0130  Provider Notified: Chakravartti  Orders Received/Actions taken: none, continue to monitor

## 2017-11-18 NOTE — Progress Notes (Signed)
ANTICOAGULATION CONSULT NOTE  Pharmacy Consult for heparin Indication: chest pain/ACS  Heparin Dosing Weight: 74.8 kg  Labs: Recent Labs    11/17/17 1650 11/17/17 2220 11/18/17 0249 11/18/17 0906 11/18/17 1404  HGB 15.0  --  14.6  --   --   HCT 44.5  --  45.3  --   --   PLT 264  --  216  --   --   LABPROT  --   --  12.7  --   --   INR  --   --  0.96  --   --   HEPARINUNFRC  --   --  0.13* 0.31 0.23*  CREATININE 0.93  --  0.92  --   --   TROPONINI 5.50* 9.25*  --   --  6.91*    Assessment: 57 yom presenting with CP/NSTEMI. Pharmacy consulted to dose heparin. Not on anticoagulation PTA. Noted plans for cath on 9/30 -heparin level= 0.23 (down from 0.31)  Goal of Therapy:  Heparin level 0.3-0.7 units/ml Monitor platelets by anticoagulation protocol: Yes   Plan:  -heparin bolus 1000 units x1 and increase to 1250 units/hr -Heparin level in 6 hours and daily wth CBC daily   Harland German, PharmD Clinical Pharmacist Please check Amion for pharmacy contact number

## 2017-11-18 NOTE — Progress Notes (Signed)
ANTICOAGULATION CONSULT NOTE  Pharmacy Consult for heparin Indication: chest pain/ACS  Heparin Dosing Weight: 74.8 kg  Labs: Recent Labs    11/17/17 1650 11/17/17 2220 11/18/17 0249 11/18/17 0906  HGB 15.0  --  14.6  --   HCT 44.5  --  45.3  --   PLT 264  --  216  --   LABPROT  --   --  12.7  --   INR  --   --  0.96  --   HEPARINUNFRC  --   --  0.13* 0.31  CREATININE 0.93  --  0.92  --   TROPONINI 5.50* 9.25*  --   --     Assessment: 57 yom presenting with CP/NSTEMI. Pharmacy consulted to dose heparin. Not on anticoagulation PTA. -heparin level at goal  Goal of Therapy:  Heparin level 0.3-0.7 units/ml Monitor platelets by anticoagulation protocol: Yes   Plan:  No heparin changes needed Will recheck heparin level later today Daily heparin level/CBC  Harland German, PharmD Clinical Pharmacist Please check Amion for pharmacy contact number

## 2017-11-19 DIAGNOSIS — Z8249 Family history of ischemic heart disease and other diseases of the circulatory system: Secondary | ICD-10-CM

## 2017-11-19 LAB — CBC
HCT: 44.1 % (ref 39.0–52.0)
Hemoglobin: 14.4 g/dL (ref 13.0–17.0)
MCH: 29.2 pg (ref 26.0–34.0)
MCHC: 32.7 g/dL (ref 30.0–36.0)
MCV: 89.5 fL (ref 78.0–100.0)
Platelets: 243 10*3/uL (ref 150–400)
RBC: 4.93 MIL/uL (ref 4.22–5.81)
RDW: 13.2 % (ref 11.5–15.5)
WBC: 10.6 10*3/uL — ABNORMAL HIGH (ref 4.0–10.5)

## 2017-11-19 LAB — HEPARIN LEVEL (UNFRACTIONATED)
Heparin Unfractionated: 0.49 IU/mL (ref 0.30–0.70)
Heparin Unfractionated: 0.37 IU/mL (ref 0.30–0.70)

## 2017-11-19 MED ORDER — SODIUM CHLORIDE 0.9 % IV SOLN
250.0000 mL | INTRAVENOUS | Status: DC | PRN
  Administered 2017-11-19: 250 mL via INTRAVENOUS

## 2017-11-19 MED ORDER — SODIUM CHLORIDE 0.9 % WEIGHT BASED INFUSION: 1.0000 mL/kg/h | INTRAVENOUS | Status: DC

## 2017-11-19 MED ORDER — SODIUM CHLORIDE 0.9 % WEIGHT BASED INFUSION: 3.0000 mL/kg/h | INTRAVENOUS | Status: DC

## 2017-11-19 MED ORDER — METOPROLOL TARTRATE 12.5 MG HALF TABLET
12.5000 mg | ORAL_TABLET | Freq: Two times a day (BID) | ORAL | Status: DC
  Administered 2017-11-19 – 2017-11-21 (×4): 12.5 mg via ORAL
  Filled 2017-11-19 (×3): qty 1

## 2017-11-19 MED ORDER — SODIUM CHLORIDE 0.9% FLUSH
3.0000 mL | Freq: Two times a day (BID) | INTRAVENOUS | Status: DC
  Administered 2017-11-19: 3 mL via INTRAVENOUS

## 2017-11-19 MED ORDER — ASPIRIN 81 MG PO CHEW
81.0000 mg | CHEWABLE_TABLET | ORAL | Status: AC
  Administered 2017-11-20: 81 mg via ORAL
  Filled 2017-11-19: qty 1

## 2017-11-19 MED ORDER — HEPARIN BOLUS VIA INFUSION
2000.0000 [IU] | Freq: Once | INTRAVENOUS | Status: AC
  Administered 2017-11-19: 2000 [IU] via INTRAVENOUS
  Filled 2017-11-19: qty 2000

## 2017-11-19 MED ORDER — SODIUM CHLORIDE 0.9% FLUSH: 3.0000 mL | INTRAVENOUS | Status: DC | PRN

## 2017-11-19 NOTE — Progress Notes (Signed)
Progress Note  Patient Name: Damon [name] Date of Encounter: 11/19/2017  Primary Cardiologist: No primary care provider on file. New  Subjective   No angina overnight, denies dyspnea or other cardiovascular complaints  Inpatient Medications    Scheduled Meds: . aspirin EC  81 mg Oral Daily  . atorvastatin  80 mg Oral q1800  . lisinopril  20 mg Oral QHS   Continuous Infusions: . sodium chloride 500 mL (11/17/17 1814)  . heparin 1,400 Units/hr (11/19/17 0824)   PRN Meds: sodium chloride, acetaminophen, clonazePAM, nitroGLYCERIN, ondansetron (ZOFRAN) IV   Vital Signs    Vitals:   11/18/17 2348 11/19/17 0436 11/19/17 0800 11/19/17 0900  BP: 116/75 104/73    Pulse: 72 64    Resp: 19 15  (!) 8  Temp: 98.9 F (37.2 C) 98.1 F (36.7 C) 97.9 F (36.6 C)   TempSrc: Oral Oral Oral   SpO2: 94% 98%    Weight:      Height:        Intake/Output Summary (Last 24 hours) at 11/19/2017 1209 Last data filed at 11/19/2017 0800 Gross per 24 hour  Intake 480 ml  Output -  Net 480 ml   Filed Weights   11/17/17 1617 11/17/17 2041  Weight: 74.8 kg 76.4 kg    Telemetry    Sinus rhythm- Personally Reviewed  ECG    No new tracing- Personally Reviewed  Physical Exam  Appears well GEN: No acute distress.   Neck: No JVD Cardiac: RRR, no murmurs, rubs, or gallops.  Respiratory: Clear to auscultation bilaterally. GI: Soft, nontender, non-distended  MS: No edema; No deformity. Neuro:  Nonfocal  Psych: Normal affect   Labs    Chemistry Recent Labs  Lab 11/17/17 1650 11/18/17 0249  NA 141 137  K 4.0 3.2*  CL 105 106  CO2 27 23  GLUCOSE 93 119*  BUN 16 12  CREATININE 0.93 0.92  CALCIUM 9.4 8.9  PROT 7.9  --   ALBUMIN 4.6  --   AST 124*  --   ALT 45*  --   ALKPHOS 85  --   BILITOT 0.3  --   GFRNONAA >60 >60  GFRAA >60 >60  ANIONGAP 9 8     Hematology Recent Labs  Lab 11/17/17 1650 11/18/17 0249 11/19/17 0627  WBC 12.8* 13.9* 10.6*  RBC 5.17 5.16  4.93  HGB 15.0 14.6 14.4  HCT 44.5 45.3 44.1  MCV 86.1 87.8 89.5  MCH 29.0 28.3 29.2  MCHC 33.7 32.2 32.7  RDW 13.7 13.2 13.2  PLT 264 216 243    Cardiac Enzymes Recent Labs  Lab 11/17/17 1650 11/17/17 2220 11/18/17 1404  TROPONINI 5.50* 9.25* 6.91*   No results for input(s): TROPIPOC in the last 168 hours.   BNPNo results for input(s): BNP, PROBNP in the last 168 hours.   DDimer No results for input(s): DDIMER in the last 168 hours.   Radiology    Dg Chest 2 View  Result Date: 11/17/2017 CLINICAL DATA:  Two days of chest pain, worse when lying flat EXAM: CHEST - 2 VIEW COMPARISON:  None. FINDINGS: There is no focal parenchymal opacity. There is no pleural effusion or pneumothorax. The heart and mediastinal contours are unremarkable. The osseous structures are unremarkable. IMPRESSION: No active cardiopulmonary disease. Electronically Signed   By: Elige Ko   On: 11/17/2017 17:41    Cardiac Studies    ECHO 11/18/2017 ------------------------------------------------------------------- Study Conclusions  - Left ventricle: The cavity size  was normal. Systolic function was mildly reduced. The estimated ejection fraction was in the range of 45% to 50%. Hypokinesis of the mid-apical anterolateral and inferolateral myocardium; consistent with ischemia or infarction in the distribution of the left circumflex coronary artery or a ramus intermedius artery. Left ventricular diastolic function parameters were normal.   Patient Profile     57 y.o. male with NSTEMI and lateral wall motion abnormality,mild EF reduction, without clinical heart failure  Assessment & Plan    Currently asymptomatic.  Troponin peaked at approximately 9, but the area of wall motion abnormality suggests a somewhat larger area of myocardium in jeopardy (consider large ramus intermedius or a proximal oblique marginal artery). No clinical signs or symptoms of heart failure. Cardiac  catheterization and possible revascularization tomorrow. This procedure has been fully reviewed with the patient and written informed consent has been obtained.   For questions or updates, please contact [facility] Please consult [email or web address] for contact info under        Signed, Thurmon Fair, MD  11/19/2017, 12:09 PM

## 2017-11-19 NOTE — Progress Notes (Signed)
ANTICOAGULATION CONSULT NOTE  Pharmacy Consult for heparin Indication: chest pain/ACS  Heparin Dosing Weight: 74.8 kg  Labs: Recent Labs    11/17/17 1650 11/17/17 2220 11/18/17 0249  11/18/17 1404 11/18/17 2212 11/19/17 0627 11/19/17 1324  HGB 15.0  --  14.6  --   --   --  14.4  --   HCT 44.5  --  45.3  --   --   --  44.1  --   PLT 264  --  216  --   --   --  243  --   LABPROT  --   --  12.7  --   --   --   --   --   INR  --   --  0.96  --   --   --   --   --   HEPARINUNFRC  --   --  0.13*   < > 0.23* 0.24* 0.49 0.37  CREATININE 0.93  --  0.92  --   --   --   --   --   TROPONINI 5.50* 9.25*  --   --  6.91*  --   --   --    < > = values in this interval not displayed.    Assessment: 23 yom presenting with CP/NSTEMI. Pharmacy consulted to dose heparin. Not on anticoagulation PTA. Noted plans for cath on 9/30 -heparin level not at goal on 1400 units/hr  Goal of Therapy:  Heparin level 0.3-0.7 units/ml Monitor platelets by anticoagulation protocol: Yes   Plan:  -No heparin changes needed -Daily heparin level and CBC   Harland German, PharmD Clinical Pharmacist Please check Amion for pharmacy contact number

## 2017-11-19 NOTE — H&P (View-Only) (Signed)
 Progress Note  Patient Name: Damon [name] Date of Encounter: 11/19/2017  Primary Cardiologist: No primary care provider on file. New  Subjective   No angina overnight, denies dyspnea or other cardiovascular complaints  Inpatient Medications    Scheduled Meds: . aspirin EC  81 mg Oral Daily  . atorvastatin  80 mg Oral q1800  . lisinopril  20 mg Oral QHS   Continuous Infusions: . sodium chloride 500 mL (11/17/17 1814)  . heparin 1,400 Units/hr (11/19/17 0824)   PRN Meds: sodium chloride, acetaminophen, clonazePAM, nitroGLYCERIN, ondansetron (ZOFRAN) IV   Vital Signs    Vitals:   11/18/17 2348 11/19/17 0436 11/19/17 0800 11/19/17 0900  BP: 116/75 104/73    Pulse: 72 64    Resp: 19 15  (!) 8  Temp: 98.9 F (37.2 C) 98.1 F (36.7 C) 97.9 F (36.6 C)   TempSrc: Oral Oral Oral   SpO2: 94% 98%    Weight:      Height:        Intake/Output Summary (Last 24 hours) at 11/19/2017 1209 Last data filed at 11/19/2017 0800 Gross per 24 hour  Intake 480 ml  Output -  Net 480 ml   Filed Weights   11/17/17 1617 11/17/17 2041  Weight: 74.8 kg 76.4 kg    Telemetry    Sinus rhythm- Personally Reviewed  ECG    No new tracing- Personally Reviewed  Physical Exam  Appears well GEN: No acute distress.   Neck: No JVD Cardiac: RRR, no murmurs, rubs, or gallops.  Respiratory: Clear to auscultation bilaterally. GI: Soft, nontender, non-distended  MS: No edema; No deformity. Neuro:  Nonfocal  Psych: Normal affect   Labs    Chemistry Recent Labs  Lab 11/17/17 1650 11/18/17 0249  NA 141 137  K 4.0 3.2*  CL 105 106  CO2 27 23  GLUCOSE 93 119*  BUN 16 12  CREATININE 0.93 0.92  CALCIUM 9.4 8.9  PROT 7.9  --   ALBUMIN 4.6  --   AST 124*  --   ALT 45*  --   ALKPHOS 85  --   BILITOT 0.3  --   GFRNONAA >60 >60  GFRAA >60 >60  ANIONGAP 9 8     Hematology Recent Labs  Lab 11/17/17 1650 11/18/17 0249 11/19/17 0627  WBC 12.8* 13.9* 10.6*  RBC 5.17 5.16  4.93  HGB 15.0 14.6 14.4  HCT 44.5 45.3 44.1  MCV 86.1 87.8 89.5  MCH 29.0 28.3 29.2  MCHC 33.7 32.2 32.7  RDW 13.7 13.2 13.2  PLT 264 216 243    Cardiac Enzymes Recent Labs  Lab 11/17/17 1650 11/17/17 2220 11/18/17 1404  TROPONINI 5.50* 9.25* 6.91*   No results for input(s): TROPIPOC in the last 168 hours.   BNPNo results for input(s): BNP, PROBNP in the last 168 hours.   DDimer No results for input(s): DDIMER in the last 168 hours.   Radiology    Dg Chest 2 View  Result Date: 11/17/2017 CLINICAL DATA:  Two days of chest pain, worse when lying flat EXAM: CHEST - 2 VIEW COMPARISON:  None. FINDINGS: There is no focal parenchymal opacity. There is no pleural effusion or pneumothorax. The heart and mediastinal contours are unremarkable. The osseous structures are unremarkable. IMPRESSION: No active cardiopulmonary disease. Electronically Signed   By: Hetal  Patel   On: 11/17/2017 17:41    Cardiac Studies    ECHO 11/18/2017 ------------------------------------------------------------------- Study Conclusions  - Left ventricle: The cavity size   was normal. Systolic function was mildly reduced. The estimated ejection fraction was in the range of 45% to 50%. Hypokinesis of the mid-apical anterolateral and inferolateral myocardium; consistent with ischemia or infarction in the distribution of the left circumflex coronary artery or a ramus intermedius artery. Left ventricular diastolic function parameters were normal.   Patient Profile     57 y.o. male with NSTEMI and lateral wall motion abnormality,mild EF reduction, without clinical heart failure  Assessment & Plan    Currently asymptomatic.  Troponin peaked at approximately 9, but the area of wall motion abnormality suggests a somewhat larger area of myocardium in jeopardy (consider large ramus intermedius or a proximal oblique marginal artery). No clinical signs or symptoms of heart failure. Cardiac  catheterization and possible revascularization tomorrow. This procedure has been fully reviewed with the patient and written informed consent has been obtained.   For questions or updates, please contact [facility] Please consult [email or web address] for contact info under        Signed, Yunus Stoklosa, MD  11/19/2017, 12:09 PM    

## 2017-11-19 NOTE — Progress Notes (Signed)
ANTICOAGULATION CONSULT NOTE  Pharmacy Consult for Heparin Indication: chest pain/ACS  Heparin Dosing Weight: 74.8 kg  Labs: Recent Labs    11/17/17 1650 11/17/17 2220 11/18/17 0249  11/18/17 1404 11/18/17 2212 11/19/17 0627  HGB 15.0  --  14.6  --   --   --  14.4  HCT 44.5  --  45.3  --   --   --  44.1  PLT 264  --  216  --   --   --  243  LABPROT  --   --  12.7  --   --   --   --   INR  --   --  0.96  --   --   --   --   HEPARINUNFRC  --   --  0.13*   < > 0.23* 0.24* 0.49  CREATININE 0.93  --  0.92  --   --   --   --   TROPONINI 5.50* 9.25*  --   --  6.91*  --   --    < > = values in this interval not displayed.    Assessment: 74 yom presenting with CP/NSTEMI. Pharmacy consulted to dose heparin. Not on anticoagulation PTA.  -Heparin level therapeutic x 1 after rate increase  Goal of Therapy:  Heparin level 0.3-0.7 units/ml Monitor platelets by anticoagulation protocol: Yes   Plan:  Cont heparin 1400 units/hr 1400 confirmatory heparin level  Abran Duke, PharmD, BCPS Clinical Pharmacist Phone: [phone]

## 2017-11-19 NOTE — Progress Notes (Signed)
ANTICOAGULATION CONSULT NOTE  Pharmacy Consult for Heparin Indication: chest pain/ACS  Heparin Dosing Weight: 74.8 kg  Labs: Recent Labs    11/17/17 1650 11/17/17 2220  11/18/17 0249 11/18/17 0906 11/18/17 1404 11/18/17 2212  HGB 15.0  --   --  14.6  --   --   --   HCT 44.5  --   --  45.3  --   --   --   PLT 264  --   --  216  --   --   --   LABPROT  --   --   --  12.7  --   --   --   INR  --   --   --  0.96  --   --   --   HEPARINUNFRC  --   --    < > 0.13* 0.31 0.23* 0.24*  CREATININE 0.93  --   --  0.92  --   --   --   TROPONINI 5.50* 9.25*  --   --   --  6.91*  --    < > = values in this interval not displayed.    Assessment: 12 yom presenting with CP/NSTEMI. Pharmacy consulted to dose heparin. Not on anticoagulation PTA.  -Heparin level sub-therapeutic tonight  Goal of Therapy:  Heparin level 0.3-0.7 units/ml Monitor platelets by anticoagulation protocol: Yes   Plan:  Heparin 2000 units [employer] heparin to 1400 units/hr 0700 HL  Abran Duke, PharmD, BCPS Clinical Pharmacist Phone: [phone]

## 2017-11-20 ENCOUNTER — Encounter (HOSPITAL_COMMUNITY): Payer: Self-pay | Admitting: Cardiovascular Disease

## 2017-11-20 ENCOUNTER — Encounter (HOSPITAL_COMMUNITY)
Admission: EM | Disposition: A | Discharge status: Home / Self Care | Payer: Self-pay | Source: Home / Self Care | Attending: Cardiology

## 2017-11-20 DIAGNOSIS — I2510 Atherosclerotic heart disease of native coronary artery without angina pectoris: Secondary | ICD-10-CM

## 2017-11-20 HISTORY — PX: LEFT HEART CATH AND CORONARY ANGIOGRAPHY: CATH118249

## 2017-11-20 HISTORY — PX: CORONARY STENT INTERVENTION: CATH118234

## 2017-11-20 LAB — BASIC METABOLIC PANEL
Anion gap: 7 (ref 5–15)
BUN: 15 mg/dL (ref 6–20)
CO2: 24 mmol/L (ref 22–32)
Calcium: 9 mg/dL (ref 8.9–10.3)
Chloride: 106 mmol/L (ref 98–111)
Creatinine, Ser: 0.83 mg/dL (ref 0.61–1.24)
GFR calc Af Amer: >60 mL/min (ref >60)
GFR calc non Af Amer: >60 mL/min (ref >60)
Glucose, Bld: 97 mg/dL (ref 70–99)
Potassium: 4.5 mmol/L (ref 3.5–5.1)
Sodium: 137 mmol/L (ref 135–145)

## 2017-11-20 LAB — CBC
HCT: 41.1 % (ref 39.0–52.0)
Hemoglobin: 13.2 g/dL (ref 13.0–17.0)
MCH: 28.8 pg (ref 26.0–34.0)
MCHC: 32.1 g/dL (ref 30.0–36.0)
MCV: 89.7 fL (ref 78.0–100.0)
Platelets: 243 10*3/uL (ref 150–400)
RBC: 4.58 MIL/uL (ref 4.22–5.81)
RDW: 13.1 % (ref 11.5–15.5)
WBC: 10.7 10*3/uL — ABNORMAL HIGH (ref 4.0–10.5)

## 2017-11-20 LAB — POCT ACTIVATED CLOTTING TIME: Activated Clotting Time: 439 seconds

## 2017-11-20 SURGERY — LEFT HEART CATH AND CORONARY ANGIOGRAPHY
Anesthesia: LOCAL

## 2017-11-20 MED ORDER — SODIUM CHLORIDE 0.9 % IV SOLN: 250.0000 mL | INTRAVENOUS | Status: DC | PRN

## 2017-11-20 MED ORDER — NITROGLYCERIN 1 MG/10 ML FOR IR/CATH LAB
INTRA_ARTERIAL | Status: AC
  Filled 2017-11-20: qty 10

## 2017-11-20 MED ORDER — MORPHINE SULFATE (PF) 2 MG/ML IV SOLN: 2.0000 mg | INTRAVENOUS | Status: DC | PRN

## 2017-11-20 MED ORDER — TICAGRELOR 90 MG PO TABS
ORAL_TABLET | ORAL | Status: AC
  Filled 2017-11-20: qty 2

## 2017-11-20 MED ORDER — HEPARIN (PORCINE) IN NACL 1000-0.9 UT/500ML-% IV SOLN
INTRAVENOUS | Status: DC | PRN
  Administered 2017-11-20: 500 mL

## 2017-11-20 MED ORDER — BIVALIRUDIN TRIFLUOROACETATE 250 MG IV SOLR
INTRAVENOUS | Status: AC
  Filled 2017-11-20: qty 250

## 2017-11-20 MED ORDER — SODIUM CHLORIDE 0.9 % IV SOLN
1.7500 mg/kg/h | INTRAVENOUS | Status: AC
  Administered 2017-11-20: 13:00:00 1.75 mg/kg/h via INTRAVENOUS
  Filled 2017-11-20: qty 250

## 2017-11-20 MED ORDER — HEPARIN SODIUM (PORCINE) 1000 UNIT/ML IJ SOLN
INTRAMUSCULAR | Status: DC | PRN
  Administered 2017-11-20: 4000 [IU] via INTRAVENOUS

## 2017-11-20 MED ORDER — SODIUM CHLORIDE 0.9 % IV SOLN
INTRAVENOUS | Status: AC
  Administered 2017-11-20: 13:00:00 via INTRAVENOUS

## 2017-11-20 MED ORDER — SODIUM CHLORIDE 0.9 % IV SOLN
INTRAVENOUS | Status: AC | PRN
  Administered 2017-11-20 (×2): 1.75 mg/kg/h via INTRAVENOUS

## 2017-11-20 MED ORDER — LIDOCAINE HCL (PF) 1 % IJ SOLN
INTRAMUSCULAR | Status: DC | PRN
  Administered 2017-11-20: 1 mL

## 2017-11-20 MED ORDER — IOHEXOL 350 MG/ML SOLN
INTRAVENOUS | Status: DC | PRN
  Administered 2017-11-20: 150 mL via INTRA_ARTERIAL

## 2017-11-20 MED ORDER — LIDOCAINE HCL (PF) 1 % IJ SOLN
INTRAMUSCULAR | Status: AC
  Filled 2017-11-20: qty 30

## 2017-11-20 MED ORDER — NITROGLYCERIN 1 MG/10 ML FOR IR/CATH LAB
INTRA_ARTERIAL | Status: DC | PRN
  Administered 2017-11-20: 200 ug via INTRACORONARY

## 2017-11-20 MED ORDER — ASPIRIN 81 MG PO CHEW: 81.0000 mg | CHEWABLE_TABLET | Freq: Every day | ORAL | Status: DC

## 2017-11-20 MED ORDER — HYDRALAZINE HCL 20 MG/ML IJ SOLN: 5.0000 mg | INTRAMUSCULAR | Status: AC | PRN

## 2017-11-20 MED ORDER — TICAGRELOR 90 MG PO TABS
90.0000 mg | ORAL_TABLET | Freq: Two times a day (BID) | ORAL | Status: DC
  Administered 2017-11-20 – 2017-11-21 (×2): 90 mg via ORAL
  Filled 2017-11-20 (×2): qty 1

## 2017-11-20 MED ORDER — SODIUM CHLORIDE 0.9% FLUSH: 3.0000 mL | INTRAVENOUS | Status: DC | PRN

## 2017-11-20 MED ORDER — TICAGRELOR 90 MG PO TABS
ORAL_TABLET | ORAL | Status: DC | PRN
  Administered 2017-11-20: 180 mg via ORAL

## 2017-11-20 MED ORDER — LABETALOL HCL 5 MG/ML IV SOLN: 10.0000 mg | INTRAVENOUS | Status: AC | PRN

## 2017-11-20 MED ORDER — ACETAMINOPHEN 325 MG PO TABS: 650.0000 mg | ORAL_TABLET | ORAL | Status: DC | PRN

## 2017-11-20 MED ORDER — SODIUM CHLORIDE 0.9 % IV SOLN
1.7500 mg/kg/h | INTRAVENOUS | Status: DC
  Filled 2017-11-20: qty 250

## 2017-11-20 MED ORDER — VERAPAMIL HCL 2.5 MG/ML IV SOLN
INTRAVENOUS | Status: DC | PRN
  Administered 2017-11-20: 10 mL via INTRA_ARTERIAL

## 2017-11-20 MED ORDER — MORPHINE SULFATE (PF) 10 MG/ML IV SOLN: 2.0000 mg | INTRAVENOUS | Status: DC | PRN

## 2017-11-20 MED ORDER — BIVALIRUDIN BOLUS VIA INFUSION - CUPID
INTRAVENOUS | Status: DC | PRN
  Administered 2017-11-20: 58.275 mg via INTRAVENOUS

## 2017-11-20 MED ORDER — ONDANSETRON HCL 4 MG/2ML IJ SOLN: 4.0000 mg | Freq: Four times a day (QID) | INTRAMUSCULAR | Status: DC | PRN

## 2017-11-20 MED ORDER — VERAPAMIL HCL 2.5 MG/ML IV SOLN
INTRAVENOUS | Status: AC
  Filled 2017-11-20: qty 2

## 2017-11-20 MED ORDER — SODIUM CHLORIDE 0.9% FLUSH
3.0000 mL | Freq: Two times a day (BID) | INTRAVENOUS | Status: DC
  Administered 2017-11-21: 3 mL via INTRAVENOUS

## 2017-11-20 MED ORDER — HEPARIN (PORCINE) IN NACL 1000-0.9 UT/500ML-% IV SOLN
INTRAVENOUS | Status: AC
  Filled 2017-11-20: qty 1000

## 2017-11-20 SURGICAL SUPPLY — 21 items
BALLN SAPPHIRE 2.0X12 (BALLOONS) ×2
BALLN SAPPHIRE ~~LOC~~ 2.5X12 (BALLOONS) ×1 IMPLANT
BALLOON SAPPHIRE 2.0X12 (BALLOONS) IMPLANT
CATH INFINITI 5FR ANG PIGTAIL (CATHETERS) ×1 IMPLANT
CATH OPTITORQUE TIG 4.0 5F (CATHETERS) ×1 IMPLANT
CATH VISTA GUIDE 6FR JL3.5 (CATHETERS) ×1 IMPLANT
CATH VISTA GUIDE 6FR XB3 (CATHETERS) ×1 IMPLANT
CATH VISTA GUIDE 6FR XB3.5 (CATHETERS) ×1 IMPLANT
DEVICE RAD COMP TR BAND LRG (VASCULAR PRODUCTS) ×1 IMPLANT
GLIDESHEATH SLEND A-KIT 6F 22G (SHEATH) ×1 IMPLANT
GUIDEWIRE INQWIRE 1.5J.035X260 (WIRE) IMPLANT
INQWIRE 1.5J .035X260CM (WIRE) ×2
KIT ENCORE 26 ADVANTAGE (KITS) ×1 IMPLANT
KIT HEART LEFT (KITS) ×2 IMPLANT
PACK CARDIAC CATHETERIZATION (CUSTOM PROCEDURE TRAY) ×2 IMPLANT
STENT SYNERGY DES 2.25X16 (Permanent Stent) ×1 IMPLANT
SYR MEDRAD MARK V 150ML (SYRINGE) ×2 IMPLANT
TRANSDUCER W/STOPCOCK (MISCELLANEOUS) ×2 IMPLANT
TUBING CIL FLEX 10 FLL-RA (TUBING) ×2 IMPLANT
WIRE ASAHI PROWATER 180CM (WIRE) ×1 IMPLANT
WIRE HI TORQ VERSACORE-J 145CM (WIRE) ×1 IMPLANT

## 2017-11-20 NOTE — Interval H&P Note (Signed)
Cath Lab Visit (complete for each Cath Lab visit)  Clinical Evaluation Leading to the Procedure:   ACS: Yes.    Non-ACS:    Anginal Classification: CCS III  Anti-ischemic medical therapy: No Therapy  Non-Invasive Test Results: No non-invasive testing performed  Prior CABG: No previous CABG      History and Physical Interval Note:  11/20/2017 9:13 AM  Damon [name]  has presented today for surgery, with the diagnosis of NSTEMI  The various methods of treatment have been discussed with the patient and family. After consideration of risks, benefits and other options for treatment, the patient has consented to  Procedure(s): LEFT HEART CATH AND CORONARY ANGIOGRAPHY (N/A) as a surgical intervention .  The patient's history has been reviewed, patient examined, no change in status, stable for surgery.  I have reviewed the patient's chart and labs.  Questions were answered to the patient's satisfaction.     Nanetta Batty

## 2017-11-20 NOTE — Progress Notes (Signed)
NCM has place benefits check for Brilinta 90 mg twice a day. NCM to f/u with results. Gae Gallop RN,BSN,CM [phone]

## 2017-11-20 NOTE — Care Management (Signed)
11-20-17 BENEFITS CHECK :  # 4.  S/W MARK @  [employer] # [phone]  BRILINTA   90 MG BID COVER- YES CO-PAY- $ 80.00 TIER- NO PRIOR APPROVAL- NO NO DEDUCTIBLE  PREFERRED PHARMACY : YES [facility], [facility] AND [facility]

## 2017-11-20 NOTE — Care Management Note (Addendum)
Case Management Note  Patient Details  Name: Damon [name] MRN: [id number] Date of Birth: [birth year]  Subjective/Objective:       NSTEM. From home with wife/sgo. Prior to hospital visit pt independent with ADL'S , no DME usage.          Drucilla Schmidt (Sgo) Keltin Baird ([phone]     PCP: Evelena Peat  Action/Plan: Transition to home when medically ready. Pt prefers Rx meds electronically sent to [facility]. NCM called pharmacy and confirmed [facility] is available, in stock. Brilinta coupon card given to pt/wife with explanation per NCM.   Pt has transportation to home @ d/c.  Expected Discharge Date:    11/21/2017      Expected Discharge Plan:  Home/Self Care  In-House Referral:     Discharge planning Services  CM Consult  Post Acute Care Choice:    Choice offered to:     DME Arranged:  N/A DME Agency:  NA  HH Arranged:  NA HH Agency:  NA  Status of Service:  Completed, signed off  If discussed at Long Length of Stay Meetings, dates discussed:    Additional Comments:  Epifanio Lesches, RN 11/20/2017, 12:43 PM

## 2017-11-21 ENCOUNTER — Encounter: Payer: BLUE CROSS/BLUE SHIELD | Admitting: Family Medicine

## 2017-11-21 DIAGNOSIS — E782 Mixed hyperlipidemia: Secondary | ICD-10-CM

## 2017-11-21 DIAGNOSIS — I10 Essential (primary) hypertension: Secondary | ICD-10-CM

## 2017-11-21 LAB — CBC
HCT: 40.8 % (ref 39.0–52.0)
Hemoglobin: 13.3 g/dL (ref 13.0–17.0)
MCH: 28.8 pg (ref 26.0–34.0)
MCHC: 32.6 g/dL (ref 30.0–36.0)
MCV: 88.3 fL (ref 78.0–100.0)
Platelets: 224 10*3/uL (ref 150–400)
RBC: 4.62 MIL/uL (ref 4.22–5.81)
RDW: 13.2 % (ref 11.5–15.5)
WBC: 11.2 10*3/uL — ABNORMAL HIGH (ref 4.0–10.5)

## 2017-11-21 LAB — BASIC METABOLIC PANEL
Anion gap: 5 (ref 5–15)
BUN: 13 mg/dL (ref 6–20)
CO2: 23 mmol/L (ref 22–32)
Calcium: 8.4 mg/dL — ABNORMAL LOW (ref 8.9–10.3)
Chloride: 108 mmol/L (ref 98–111)
Creatinine, Ser: 0.86 mg/dL (ref 0.61–1.24)
GFR calc Af Amer: >60 mL/min (ref >60)
GFR calc non Af Amer: >60 mL/min (ref >60)
Glucose, Bld: 97 mg/dL (ref 70–99)
Potassium: 4.3 mmol/L (ref 3.5–5.1)
Sodium: 136 mmol/L (ref 135–145)

## 2017-11-21 LAB — MRSA PCR SCREENING: MRSA by PCR: NEGATIVE

## 2017-11-21 MED ORDER — HEART ATTACK BOUNCING BOOK
Freq: Once | Status: AC
  Administered 2017-11-21: 1
  Filled 2017-11-21: qty 1

## 2017-11-21 MED ORDER — TICAGRELOR 90 MG PO TABS: 90.0000 mg | ORAL_TABLET | Freq: Two times a day (BID) | ORAL | 1 refills | Status: DC

## 2017-11-21 MED ORDER — THE SENSUOUS HEART BOOK
Freq: Once | Status: AC
  Administered 2017-11-21: 04:00:00 1
  Filled 2017-11-21: qty 1

## 2017-11-21 MED ORDER — ASPIRIN 81 MG PO TBEC: 81.0000 mg | DELAYED_RELEASE_TABLET | Freq: Every day | ORAL | Status: DC

## 2017-11-21 MED ORDER — NITROGLYCERIN 0.4 MG SL SUBL: 0.4000 mg | SUBLINGUAL_TABLET | SUBLINGUAL | 1 refills | Status: DC | PRN

## 2017-11-21 MED ORDER — METOPROLOL SUCCINATE ER 25 MG PO TB24: 25.0000 mg | ORAL_TABLET | Freq: Every day | ORAL | 1 refills | Status: DC

## 2017-11-21 MED ORDER — ATORVASTATIN CALCIUM 80 MG PO TABS: 80.0000 mg | ORAL_TABLET | Freq: Every day | ORAL | 1 refills | Status: DC

## 2017-11-21 MED ORDER — ANGIOPLASTY BOOK
Freq: Once | Status: AC
  Administered 2017-11-21: 04:00:00 1
  Filled 2017-11-21: qty 1

## 2017-11-21 MED FILL — NITROGLYCERIN 0.4 MG TAB SL: 0.4 | 25 days supply | Qty: 25 | Fill #0

## 2017-11-21 MED FILL — ATORVASTATIN CALCIUM 80 MG: 80 | 90 days supply | Qty: 90 | Fill #0

## 2017-11-21 MED FILL — BRILINTA 90 MG TABLET: 90 | 30 days supply | Qty: 60 | Fill #0

## 2017-11-21 MED FILL — METOPROLOL SUCCINATE ER 25: 25 | 30 days supply | Qty: 30 | Fill #0

## 2017-11-21 NOTE — Progress Notes (Signed)
CARDIAC REHAB PHASE I   PRE:  Rate/Rhythm: 85 SR  BP:  Sitting: 124/66      SaO2: 98 RA  MODE:  Ambulation: 1000 ft   96 peak HR  POST:  Rate/Rhythm: 81 SR  BP:  Sitting: 145/74    SaO2: 99 RA   Pt ambulated 1025ft in hallway with brisk steady pace. Pt denies CP or SOB. Pt educated on importance of Brilinta, ASA, statin, and NTG. Stent card and MI book at bedside. Pt given heart healthy diet. Reviewed restrictions and exercise guidelines. Pt seems to always be "on", encouraged pt to find ways to relax and destress. Will refer to CRP II [place].   [id number] Reynold Bowen, RN BSN 11/21/2017 9:03 AM

## 2017-11-21 NOTE — Discharge Summary (Addendum)
Discharge Summary    Patient ID: ROHAIL KLEES,  MRN: [id number], DOB/AGE: 1960/08/04 57 y.o.  Admit date: 11/17/2017 Discharge date: 11/21/2017  Primary Care Provider: Kristian Covey Primary Cardiologist: Dr. Royann Shivers  Discharge Diagnoses    Active Problems:   NSTEMI (non-ST elevated myocardial infarction) [facility])   Hyperlipidemia   Essential hypertension   Allergies Allergies  Allergen Reactions  . Poison Ivy Extract [Poison Ivy Extract] Itching and Rash  . Poison Oak Extract [Poison Oak Extract] Itching and Rash    Diagnostic Studies/Procedures    TTE: 11/18/17  Study Conclusions  - Left ventricle: The cavity size was normal. Systolic function was   mildly reduced. The estimated ejection fraction was in the range   of 45% to 50%. Hypokinesis of the mid-apical anterolateral and   inferolateral myocardium; consistent with ischemia or infarction   in the distribution of the left circumflex coronary artery or a   ramus intermedius artery. Left ventricular diastolic function   parameters were normal.  Cath: 11/20/17   Ost Ramus to Ramus lesion is 99% stenosed.  A drug-eluting stent was successfully placed.  Post intervention, there is a 0% residual stenosis.  There is mild left ventricular systolic dysfunction.  LV end diastolic pressure is moderately elevated.  The left ventricular ejection fraction is 45-50% by visual estimate.   IMPRESSION: Successful proximal ramus intermedius branch PCI and drug-eluting stenting using a synergy drug-eluting stent with a postdilatation diameter of 2.6 mm.  Patient had no other significant disease.  He had mild LV dysfunction with apical wall motion of normality.  We will continue Angiomax full dose for 4 hours.  He will be treated with uninterrupted dual antiplatelet therapy for 12 months.  He will be discharged home tomorrow.  He left the lab in stable condition.  Nanetta Batty. MD, [facility] _____________   History of  Present Illness     57 y.o. male with history of hypertension, hyperlipidemia who presented his chest pain. He was awoken from sleep 2 days ago with severe substernal chest pain.  The episode lasted 20 to 30 minutes, and improved after getting up out of bed and drinking some water.  He had another episode of chest discomfort that woke him from sleep at 4 AM that morning, that was not quite as severe as the initial episode.  He reported that he had not had any exacerbations of chest pain with mild to moderate exertion over the course the last 2 days.  He did note some positional quality to his pain, and noted the chest pain was worse while lying down.  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  He also had an upper respiratory infection last week, the symptoms interim.    Given his recurrent episodes of chest pain he presented to med [facility] for evaluation.  There his EKG showed sinus rhythm without acute ischemic changes.  His labs were notable for troponin of 5.5.  Started on heparin drip, loaded with aspirin, and sent to [facility] for further management. Given symptoms he was planned for cardiac cath.    Hospital Course    Underwent cath noted above with 99% lesion in the ost Ramus intermedius with PCI/DES x1. EF noted at 45-50% on visual estimate. Plan for DAPT with ASA/Brilinta for at least one year. Continued on angiomax for 4 hours post cath. Troponin peaked at 6.9. His home statin  was increased from Lipitor 10mg  to 80mg  daily. Also added low dose metoprolol with blood pressure tolerating. Continued on home lisinopril. Echo showed EF of 45-50%. Worked well with cardiac rehab without recurrent chest pain.   General: Well developed, well nourished, male appearing in no acute distress. Head: Normocephalic, atraumatic.  Neck: Supple without bruits, JVD. Lungs:  Resp regular and unlabored, CTA. Heart:  RRR, S1, S2, no S3, S4, or murmur; no rub. Abdomen: Soft, non-tender, non-distended with normoactive bowel sounds. No hepatomegaly. No rebound/guarding. No obvious abdominal masses. Extremities: No clubbing, cyanosis, edema. Distal pedal pulses are 2+ bilaterally. R radial cath site stable without bruising or hematoma Neuro: Alert and oriented X 3. Moves all extremities spontaneously. Psych: Normal affect.  TAYVION LAUDER was seen by Dr. Eldridge Dace and determined stable for discharge home. Follow up in the office has been arranged. Medications are listed below.   _____________  Discharge Vitals Blood pressure 124/74, pulse 78, temperature 97.6 F (36.4 C), temperature source Oral, resp. rate 15, height 5\' 8"  (1.727 m), weight 77.7 kg, SpO2 99 %.  Filed Weights   11/17/17 1617 11/17/17 2041 11/20/17 0617  Weight: 74.8 kg 76.4 kg 77.7 kg    Labs & Radiologic Studies    CBC Recent Labs    11/20/17 0301 11/21/17 0324  WBC 10.7* 11.2*  HGB 13.2 13.3  HCT 41.1 40.8  MCV 89.7 88.3  PLT 243 224   Basic Metabolic Panel Recent Labs    16/10/96 0630 11/21/17 0324  NA 137 136  K 4.5 4.3  CL 106 108  CO2 24 23  GLUCOSE 97 97  BUN 15 13  CREATININE 0.83 0.86  CALCIUM 9.0 8.4*   Liver Function Tests No results for input(s): AST, ALT, ALKPHOS, BILITOT, PROT, ALBUMIN in the last 72 hours. No results for input(s): LIPASE, AMYLASE in the last 72 hours. Cardiac Enzymes Recent Labs    11/18/17 1404  TROPONINI 6.91*   BNP Invalid input(s): POCBNP D-Dimer No results for input(s): DDIMER in the last 72 hours. Hemoglobin A1C No results for input(s): HGBA1C in the last 72 hours. Fasting Lipid Panel No results for input(s): CHOL, HDL, LDLCALC, TRIG, CHOLHDL, LDLDIRECT in the last 72 hours. Thyroid Function Tests No results for input(s): TSH, T4TOTAL, T3FREE, THYROIDAB in the last 72 hours.  Invalid input(s): FREET3 _____________  Dg Chest 2 View  Result Date: 11/17/2017 CLINICAL  DATA:  Two days of chest pain, worse when lying flat EXAM: CHEST - 2 VIEW COMPARISON:  None. FINDINGS: There is no focal parenchymal opacity. There is no pleural effusion or pneumothorax. The heart and mediastinal contours are unremarkable. The osseous structures are unremarkable. IMPRESSION: No active cardiopulmonary disease. Electronically Signed   By: Elige Ko   On: 11/17/2017 17:41   Dg Lumbar Spine 2-3 Views  Result Date: 11/10/2017 CLINICAL DATA:  History of low back pain. EXAM: LUMBAR SPINE - 2-3 VIEW COMPARISON:  No recent. FINDINGS: No acute bony abnormality identified. No evidence of fracture. Diffuse degenerative change. IMPRESSION: Diffuse degenerative change.  No acute abnormality. Electronically Signed   By: Maisie Fus  Register   On: 11/10/2017 06:34   Disposition   Pt is being discharged home today in good condition.  Follow-up Plans & Appointments   Follow-up Information    Azalee Course, [place] Follow up on 12/05/2017.   Specialties:  Cardiology, Radiology Why:  at 8am for your follow up appt.  Contact information: [address] [place] [address] [phone]  Discharge Instructions    Amb Referral to Cardiac Rehabilitation   Complete by:  As directed    Diagnosis:   Coronary Stents NSTEMI     Call MD for:  redness, tenderness, or signs of infection (pain, swelling, redness, odor or green/yellow discharge around incision site)   Complete by:  As directed    Diet - low sodium heart healthy   Complete by:  As directed    Discharge instructions   Complete by:  As directed    Radial Site Care Refer to this sheet in the next few weeks. These instructions provide you with information on caring for yourself after your procedure. Your caregiver may also give you more specific instructions. Your treatment has been planned according to current medical practices, but problems sometimes occur. Call your caregiver if you have any problems or questions  after your procedure. HOME CARE INSTRUCTIONS You may shower the day after the procedure.Remove the bandage (dressing) and gently wash the site with plain soap and water.Gently pat the site dry.  Do not apply powder or lotion to the site.  Do not submerge the affected site in water for 3 to 5 days.  Inspect the site at least twice daily.  Do not flex or bend the affected arm for 24 hours.  No lifting over 5 pounds (2.3 kg) for 5 days after your procedure.  Do not drive home if you are discharged the same day of the procedure. Have someone else drive you.  You may drive 24 hours after the procedure unless otherwise instructed by your caregiver.  What to expect: Any bruising will usually fade within 1 to 2 weeks.  Blood that collects in the tissue (hematoma) may be painful to the touch. It should usually decrease in size and tenderness within 1 to 2 weeks.  SEEK IMMEDIATE MEDICAL CARE IF: You have unusual pain at the radial site.  You have redness, warmth, swelling, or pain at the radial site.  You have drainage (other than a small amount of blood on the dressing).  You have chills.  You have a fever or persistent symptoms for more than 72 hours.  You have a fever and your symptoms suddenly get worse.  Your arm becomes pale, cool, tingly, or numb.  You have heavy bleeding from the site. Hold pressure on the site.   PLEASE DO NOT MISS ANY DOSES OF YOUR BRILINTA!!!!! Also keep a log of you blood pressures and bring back to your follow up appt. Please call the office with any questions.   Patients taking blood thinners should generally stay away from medicines like ibuprofen, Advil, Motrin, naproxen, and Aleve due to risk of stomach bleeding. You may take Tylenol as directed or talk to your primary doctor about alternatives.   Increase activity slowly   Complete by:  As directed      Discharge Medications     Medication List    STOP taking these medications   ibuprofen 800 MG  tablet Commonly known as:  ADVIL,MOTRIN   naproxen 500 MG tablet Commonly known as:  NAPROSYN     TAKE these medications   aspirin 81 MG EC tablet Take 1 tablet (81 mg total) by mouth daily.   atorvastatin 80 MG tablet Commonly known as:  LIPITOR Take 1 tablet (80 mg total) by mouth daily at 6 PM. What changed:    medication strength  how much to take  when to take this   cetirizine 10 MG tablet Commonly  known as:  ZYRTEC Take 10 mg by mouth 2 (two) times daily.   clonazePAM 0.5 MG tablet Commonly known as:  KLONOPIN Take 0.5-1 mg by mouth See admin instructions. Take 2 tablets in the morning and 1 tablet in the evening. May take 1 additional tablet as needed for anxiety   lisinopril 20 MG tablet Commonly known as:  PRINIVIL,ZESTRIL Take 1 tablet (20 mg total) daily by mouth.   metoprolol succinate 25 MG 24 hr tablet Commonly known as:  TOPROL-XL Take 1 tablet (25 mg total) by mouth daily.   nitroGLYCERIN 0.4 MG SL tablet Commonly known as:  NITROSTAT Place 1 tablet (0.4 mg total) under the tongue every 5 (five) minutes x 3 doses as needed for chest pain.   oxymetazoline 0.05 % nasal spray Commonly known as:  AFRIN Place 1 spray into both nostrils 2 (two) times daily as needed for congestion.   ticagrelor 90 MG Tabs tablet Commonly known as:  BRILINTA Take 1 tablet (90 mg total) by mouth 2 (two) times daily.   VYVANSE 30 MG capsule Generic drug:  lisdexamfetamine Take 30 mg by mouth daily.        Acute coronary syndrome (MI, NSTEMI, STEMI, etc) this admission?: Yes.     AHA/ACC Clinical Performance & Quality Measures: 1. Aspirin prescribed? - Yes 2. ADP Receptor Inhibitor (Plavix/Clopidogrel, Brilinta/Ticagrelor or Effient/Prasugrel) prescribed (includes medically managed patients)? - Yes 3. Beta Blocker prescribed? - Yes 4. High Intensity Statin (Lipitor 40-80mg  or Crestor 20-40mg ) prescribed? - Yes 5. EF assessed during THIS hospitalization? -  Yes 6. For EF <40%, was ACEI/ARB prescribed? - Yes 7. For EF <40%, Aldosterone Antagonist (Spironolactone or Eplerenone) prescribed? - Not Applicable (EF >/= 40%) 8. Cardiac Rehab Phase II ordered (Included Medically managed Patients)? - Yes   Outstanding Labs/Studies   FLP/LFTs in 6 weeks if tolerating statin.   Duration of Discharge Encounter   Greater than 30 minutes including physician time.  Signed, Laverda Page NP-C 11/21/2017, 9:04 AM   I have examined the patient and reviewed assessment and plan and discussed with patient.  Agree with above as stated.    GEN: Well nourished, well developed, in no acute distress  HEENT: normal  Neck: no JVD, carotid bruits, or masses Cardiac: RRR; no murmurs, rubs, or gallops,no edema  Respiratory:  clear to auscultation bilaterally, normal work of breathing GI: soft, nontender, nondistended,  MS: no deformity or atrophy ; no right radial hematoma Skin: warm and dry, no rash Neuro:  Strength and sensation are intact Psych: euthymic mood, full affect  I stressed the importance of DAPT and cardiac rehab.  Followup with Dr. Royann Shivers. OK for discharge.   Lance Muss

## 2017-11-21 NOTE — Progress Notes (Signed)
Pt given discharge instructions with understanding. Wife present. Pt has no questions at this time. Monitor and iv d/c.  Pt received medications from transitional pharmacy at bedside.

## 2017-11-22 ENCOUNTER — Telehealth (HOSPITAL_COMMUNITY): Payer: Self-pay

## 2017-11-22 ENCOUNTER — Ambulatory Visit: Payer: BLUE CROSS/BLUE SHIELD | Admitting: Physical Therapy

## 2017-11-22 NOTE — Telephone Encounter (Signed)
Called patient to see if he is interested in the Cardiac Rehab Program. Patient expressed interest. Explained scheduling process and went over insurance, patient verbalized understanding. Will contact patient for scheduling once f/u has been completed.  °

## 2017-11-22 NOTE — Telephone Encounter (Signed)
Pt insurance is active and benefits verified through [employer]. Co-pay $0.00, DED $2,500.00/$897.49 met, out of pocket $7,900.00/$1,409.18 met, co-insurance 20%. No pre-authorization. Passport, 11/22/17 @ 11:40AM, REF# [id number]  Will contact patient to see if he is interested in the Cardiac Rehab Program. If interested, patient will need to complete follow up appt. Once completed, patient will be contacted for scheduling upon review by the RN Navigator.

## 2017-11-23 DIAGNOSIS — F4320 Adjustment disorder, unspecified: Secondary | ICD-10-CM | POA: Diagnosis not present

## 2017-11-23 DIAGNOSIS — F411 Generalized anxiety disorder: Secondary | ICD-10-CM | POA: Diagnosis not present

## 2017-12-05 ENCOUNTER — Encounter: Payer: Self-pay | Admitting: Physician Assistant

## 2017-12-05 ENCOUNTER — Ambulatory Visit: Payer: BLUE CROSS/BLUE SHIELD | Admitting: Physician Assistant

## 2017-12-05 VITALS — BP 110/76 | HR 70 | Ht 68.0 in | Wt 175.0 lb

## 2017-12-05 DIAGNOSIS — E782 Mixed hyperlipidemia: Secondary | ICD-10-CM

## 2017-12-05 DIAGNOSIS — R0789 Other chest pain: Secondary | ICD-10-CM | POA: Diagnosis not present

## 2017-12-05 DIAGNOSIS — I10 Essential (primary) hypertension: Secondary | ICD-10-CM | POA: Diagnosis not present

## 2017-12-05 DIAGNOSIS — I7300 Raynaud's syndrome without gangrene: Secondary | ICD-10-CM

## 2017-12-05 DIAGNOSIS — I2510 Atherosclerotic heart disease of native coronary artery without angina pectoris: Secondary | ICD-10-CM | POA: Diagnosis not present

## 2017-12-05 MED ORDER — ATORVASTATIN CALCIUM 80 MG PO TABS: 80.0000 mg | ORAL_TABLET | Freq: Every day | ORAL | 2 refills | Status: DC

## 2017-12-05 MED ORDER — METOPROLOL SUCCINATE ER 25 MG PO TB24: 25.0000 mg | ORAL_TABLET | Freq: Every day | ORAL | 2 refills | Status: DC

## 2017-12-05 MED ORDER — LISINOPRIL 20 MG PO TABS: 20.0000 mg | ORAL_TABLET | Freq: Every day | ORAL | 2 refills | Status: DC

## 2017-12-05 MED ORDER — NITROGLYCERIN 0.4 MG SL SUBL: 0.4000 mg | SUBLINGUAL_TABLET | SUBLINGUAL | 1 refills | Status: DC | PRN

## 2017-12-05 NOTE — Patient Instructions (Addendum)
Medication Instructions:  Your physician recommends that you continue on your current medications as directed. Please refer to the Current Medication list given to you today.  If you need a refill on your cardiac medications before your next appointment, please call your pharmacy.   Lab work: Your physician recommends that you return for lab work in: 7-8 weeks LIPID, LFT If you have labs (blood work) drawn today and your tests are completely normal, you will receive your results only by: Marland Kitchen MyChart Message (if you have MyChart) OR . A paper copy in the mail If you have any lab test that is abnormal or we need to change your treatment, we will call you to review the results.  Testing/Procedures: NONE   Follow-Up: At [facility], you and your health needs are our priority.  As part of our continuing mission to provide you with exceptional heart care, we have created designated Provider Care Teams.  These Care Teams include your primary Cardiologist (physician) and Advanced Practice Providers (APPs -  Physician Assistants and Nurse Practitioners) who all work together to provide you with the care you need, when you need it. Your physician recommends that you schedule a follow-up appointment in: 2-3 months with Dr Royann Shivers.  Any Other Special Instructions Will Be Listed Below (If Applicable). Contact office for atypical chest pain

## 2017-12-05 NOTE — Progress Notes (Signed)
Cardiology Office Note    Date:  12/05/2017   ID:  Damon [name], DOB 09/19/1960, MRN [id number]  PCP:  Kristian Covey, MD  Cardiologist:  Dr. Royann Shivers  Chief Complaint  Patient presents with  . Follow-up    Post hospital.  . Chest Pain    Discomfort since last night.    History of Present Illness:  Damon [name] is a 57 y.o. male with PMH of Raynaud's, HTN and HLD who recently presented to the hospital on 10/17/2017 with substernal chest pain.  He was ruled in for NSTEMI.  Echocardiogram obtained on 10/17/2017 showed EF 45 to 50%, hypokinesis in the mid apical anterolateral and inferolateral myocardium consistent with ischemia and infarction in the left circumflex territory.  Troponin eventually peaked at night.  He ultimately underwent cardiac catheterization on 11/20/2017 which showed 99% ostial ramus lesion treated with drug-eluting stent, no significant coronary artery disease otherwise.  Postprocedure, patient was placed on aspirin and Brilinta.  He was continued on Angiomax for 4 hours post cath.  His home statin was increased from Lipitor 10 mg to 80 mg daily.  Low-dose metoprolol was also added along with home lisinopril.  Patient was eventually discharged on 10/1.  He presents today for cardiology office visit today.  Since his discharge, he has been compliant with aspirin and Brilinta.  He denies any shortness of breath.  Yesterday, he did have a pinpoint pain on the left chest, this is not associated with exertion and it feels different from his central substernal chest pain during the heart attack.  His EKG today does show T wave inversion in lead I and aVL.  I have reviewed his EKG with Dr. Royann Shivers, given the atypical symptom of his chest pain, I would recommend observation for now.  His EKG changes likely representing evolutionary changes since the heart attack that involve ramus intermedius.  He will need fasting lipid panel and LFT in 6 to 8 weeks.  Otherwise he is stable from  cardiology perspective.  He does not have any lower extremity edema, orthopnea or PND.    Past Medical History:  Diagnosis Date  . Allergy    seasonal   . Depression    situational   . Heart murmur    hx of in childhood   . Hyperlipidemia   . Hypertension   . Injury of tendon of long head of right biceps    January 2016    Past Surgical History:  Procedure Laterality Date  . APPENDECTOMY    . COLONOSCOPY N/A 11/28/2014   Procedure: COLONOSCOPY;  Surgeon: Louis Meckel, MD;  Location: [facility];  Service: Endoscopy;  Laterality: N/A;  . colonscopy      removed polyps  . CORONARY STENT INTERVENTION N/A 11/20/2017   Procedure: CORONARY STENT INTERVENTION;  Surgeon: Runell Gess, MD;  [facility];  Service: Cardiovascular;  Laterality: N/A;  . INGUINAL HERNIA REPAIR Left 06/12/2014   Procedure: LEFT INGUINAL HERNIA REPAIR WITH MESH;  Surgeon: Darnell Level, MD;  Location: [facility];  Service: General;  Laterality: Left;  . INSERTION OF MESH Left 06/12/2014   Procedure: INSERTION OF MESH;  Surgeon: Darnell Level, MD;  Location: [facility];  Service: General;  Laterality: Left;  . LEFT HEART CATH AND CORONARY ANGIOGRAPHY N/A 11/20/2017   Procedure: LEFT HEART CATH AND CORONARY ANGIOGRAPHY;  Surgeon: Runell Gess, MD;  [facility];  Service: Cardiovascular;  Laterality: N/A;  . MOUTH  SURGERY     wisdom teeth removed;root canal - 03/2014  . TONSILLECTOMY  1995  . TONSILLECTOMY      Current Medications: Outpatient Medications Prior to Visit  Medication Sig Dispense Refill  . aspirin EC 81 MG EC tablet Take 1 tablet (81 mg total) by mouth daily.    . cetirizine (ZYRTEC) 10 MG tablet Take 10 mg by mouth 2 (two) times daily.    . clonazePAM (KLONOPIN) 0.5 MG tablet Take 0.5-1 mg by mouth See admin instructions. Take 2 tablets in the morning and 1 tablet in the evening. May take 1 additional tablet as needed for anxiety    . lisdexamfetamine (VYVANSE)  30 MG capsule Take 30 mg by mouth as needed.     Marland Kitchen oxymetazoline (AFRIN) 0.05 % nasal spray Place 1 spray into both nostrils 2 (two) times daily as needed for congestion.    . ticagrelor (BRILINTA) 90 MG TABS tablet Take 1 tablet (90 mg total) by mouth 2 (two) times daily. 180 tablet 1  . atorvastatin (LIPITOR) 80 MG tablet Take 1 tablet (80 mg total) by mouth daily at 6 PM. 90 tablet 1  . lisinopril (PRINIVIL,ZESTRIL) 20 MG tablet Take 1 tablet (20 mg total) daily by mouth. 90 tablet 3  . metoprolol succinate (TOPROL XL) 25 MG 24 hr tablet Take 1 tablet (25 mg total) by mouth daily. 30 tablet 1  . nitroGLYCERIN (NITROSTAT) 0.4 MG SL tablet Place 1 tablet (0.4 mg total) under the tongue every 5 (five) minutes x 3 doses as needed for chest pain. 25 tablet 1   No facility-administered medications prior to visit.      Allergies:   Poison ivy extract [poison ivy extract] and Poison oak extract [poison oak extract]   Social History   Socioeconomic History  . Marital status: Single    Spouse name: Not on file  . Number of children: Not on file  . Years of education: Not on file  . Highest education level: Not on file  Occupational History  . Not on file  Social Needs  . Financial resource strain: Not on file  . Food insecurity:    Worry: Not on file    Inability: Not on file  . Transportation needs:    Medical: Not on file    Non-medical: Not on file  Tobacco Use  . Smoking status: Former Smoker    Packs/day: 1.50    Years: 23.00    Pack years: 34.50    Types: Cigarettes    Last attempt to quit: 02/22/1995    Years since quitting: 22.8  . Smokeless tobacco: Never Used  Substance and Sexual Activity  . Alcohol use: Yes    Comment: occas  . Drug use: No  . Sexual activity: Not on file  Lifestyle  . Physical activity:    Days per week: Not on file    Minutes per session: Not on file  . Stress: Not on file  Relationships  . Social connections:    Talks on phone: Not on file     Gets together: Not on file    Attends religious service: Not on file    Active member of club or organization: Not on file    Attends meetings of clubs or organizations: Not on file    Relationship status: Not on file  Other Topics Concern  . Not on file  Social History Narrative  . Not on file     Family History:  The  patient's family history includes AAA (abdominal aortic aneurysm) in his mother; Alzheimer's disease in his mother; Cancer in his other and paternal grandfather; Depression in his other; Heart disease in his other and paternal grandfather; Hyperlipidemia in his other; Hypertension in his other; Stroke in his other.   ROS:   Please see the history of present illness.    ROS All other systems reviewed and are negative.   PHYSICAL EXAM:   VS:  BP 110/76 (BP Location: Left Arm, Patient Position: Sitting, Cuff Size: Normal)   Pulse 70   Ht 5\' 8"  (1.727 m)   Wt 175 lb (79.4 kg)   BMI 26.61 kg/m    GEN: Well nourished, well developed, in no acute distress  HEENT: normal  Neck: no JVD, carotid bruits, or masses Cardiac: RRR; no murmurs, rubs, or gallops,no edema  Respiratory:  clear to auscultation bilaterally, normal work of breathing GI: soft, nontender, nondistended, + BS MS: no deformity or atrophy  Skin: warm and dry, no rash Neuro:  Alert and Oriented x 3, Strength and sensation are intact Psych: euthymic mood, full affect  Wt Readings from Last 3 Encounters:  12/05/17 175 lb (79.4 kg)  11/20/17 171 lb 3.2 oz (77.7 kg)  11/09/17 170 lb (77.1 kg)      Studies/Labs Reviewed:   EKG:  EKG is ordered today.  The ekg ordered today demonstrates normal sinus rhythm with T wave inversion in lead I and aVL.  Recent Labs: 12/28/2016: TSH 0.93 11/17/2017: ALT 45 11/21/2017: BUN 13; Creatinine, Ser 0.86; Hemoglobin 13.3; Platelets 224; Potassium 4.3; Sodium 136   Lipid Panel    Component Value Date/Time   CHOL 180 11/18/2017 0249   TRIG 165 (H) 11/18/2017 0249    HDL 48 11/18/2017 0249   CHOLHDL 3.8 11/18/2017 0249   VLDL 33 11/18/2017 0249   LDLCALC 99 11/18/2017 0249   LDLDIRECT 93.0 12/09/2015 0820    Additional studies/ records that were reviewed today include:   Echo 11/18/2017 LV EF: 45% -   50%  ------------------------------------------------------------------- Study Conclusions  - Left ventricle: The cavity size was normal. Systolic function was   mildly reduced. The estimated ejection fraction was in the range   of 45% to 50%. Hypokinesis of the mid-apical anterolateral and   inferolateral myocardium; consistent with ischemia or infarction   in the distribution of the left circumflex coronary artery or a   ramus intermedius artery. Left ventricular diastolic function   parameters were normal.    Cath 11/20/2017  Ost Ramus to Ramus lesion is 99% stenosed.  A drug-eluting stent was successfully placed.  Post intervention, there is a 0% residual stenosis.  There is mild left ventricular systolic dysfunction.  LV end diastolic pressure is moderately elevated.  The left ventricular ejection fraction is 45-50% by visual estimate.   ASSESSMENT:    1. Atypical chest pain   2. Mixed hyperlipidemia   3. Coronary artery disease involving native coronary artery of native heart without angina pectoris   4. Hypertension, essential   5. Raynaud's disease without gangrene      PLAN:  In order of problems listed above:  1. Atypical chest pain: Started yesterday, occurs at rest and at the location of the chest pain is different from the recent angina.  We will continue observation.  Seek medical attention if chest pain worsens or become more exertion related.  2. CAD: Continue aspirin and Brilinta.  Continue statin.  Recent DES to ramus intermedius  3. Hyperlipidemia: Continue  current statin, fasting lipid panel and LFT in 6 to 8 weeks  4. Hypertension: Blood pressure stable  5. Raynaud's disease: In the future may  consider low-dose 2.5 mg amlodipine to help decrease his symptoms.  For now would recommend conservative management.  He will need gloves during the winter.   Medication Adjustments/Labs and Tests Ordered: Current medicines are reviewed at length with the patient today.  Concerns regarding medicines are outlined above.  Medication changes, Labs and Tests ordered today are listed in the Patient Instructions below. Patient Instructions  Medication Instructions:  Your physician recommends that you continue on your current medications as directed. Please refer to the Current Medication list given to you today.  If you need a refill on your cardiac medications before your next appointment, please call your pharmacy.   Lab work: Your physician recommends that you return for lab work in: 7-8 weeks LIPID, LFT If you have labs (blood work) drawn today and your tests are completely normal, you will receive your results only by: Marland Kitchen MyChart Message (if you have MyChart) OR . A paper copy in the mail If you have any lab test that is abnormal or we need to change your treatment, we will call you to review the results.  Testing/Procedures: NONE   Follow-Up: At [facility], you and your health needs are our priority.  As part of our continuing mission to provide you with exceptional heart care, we have created designated Provider Care Teams.  These Care Teams include your primary Cardiologist (physician) and Advanced Practice Providers (APPs -  Physician Assistants and Nurse Practitioners) who all work together to provide you with the care you need, when you need it. Your physician recommends that you schedule a follow-up appointment in: 2-3 months with Dr Royann Shivers.  Any Other Special Instructions Will Be Listed Below (If Applicable). Contact office for atypical chest pain     Signed, Azalee Course, [place]  12/05/2017 1:41 PM    [facility] [address] [place], [place], [place]   [address] Phone: [phone]; Fax: [phone]

## 2017-12-06 NOTE — Telephone Encounter (Signed)
Called patient to see if he was interested in participating in the Cardiac Rehab Program. Patient stated yes. Patient will come in for orientation on 02/08/18 @ 8:30AM and will attend the 8:15AM exercise class.  Mailed homework package.

## 2017-12-13 DIAGNOSIS — F4320 Adjustment disorder, unspecified: Secondary | ICD-10-CM | POA: Diagnosis not present

## 2017-12-19 ENCOUNTER — Ambulatory Visit (INDEPENDENT_AMBULATORY_CARE_PROVIDER_SITE_OTHER): Payer: BLUE CROSS/BLUE SHIELD | Admitting: Family Medicine

## 2017-12-19 ENCOUNTER — Encounter: Payer: Self-pay | Admitting: Family Medicine

## 2017-12-19 ENCOUNTER — Other Ambulatory Visit: Payer: Self-pay

## 2017-12-19 VITALS — BP 106/76 | HR 65 | Temp 97.6°F | Ht 68.0 in | Wt 176.3 lb

## 2017-12-19 DIAGNOSIS — Z23 Encounter for immunization: Secondary | ICD-10-CM

## 2017-12-19 DIAGNOSIS — Z125 Encounter for screening for malignant neoplasm of prostate: Secondary | ICD-10-CM

## 2017-12-19 DIAGNOSIS — Z0000 Encounter for general adult medical examination without abnormal findings: Secondary | ICD-10-CM

## 2017-12-19 DIAGNOSIS — F4320 Adjustment disorder, unspecified: Secondary | ICD-10-CM | POA: Diagnosis not present

## 2017-12-19 LAB — CBC WITH DIFFERENTIAL/PLATELET
Basophils Absolute: 0.1 10*3/uL (ref 0.0–0.1)
Basophils Relative: 0.7 % (ref 0.0–3.0)
Eosinophils Absolute: 0.2 10*3/uL (ref 0.0–0.7)
Eosinophils Relative: 1.9 % (ref 0.0–5.0)
HCT: 42.6 % (ref 39.0–52.0)
Hemoglobin: 14.2 g/dL (ref 13.0–17.0)
Lymphocytes Relative: 26.6 % (ref 12.0–46.0)
Lymphs Abs: 2.2 10*3/uL (ref 0.7–4.0)
MCHC: 33.4 g/dL (ref 30.0–36.0)
MCV: 87.7 fl (ref 78.0–100.0)
Monocytes Absolute: 0.8 10*3/uL (ref 0.1–1.0)
Monocytes Relative: 9.3 % (ref 3.0–12.0)
Neutro Abs: 5.1 10*3/uL (ref 1.4–7.7)
Neutrophils Relative %: 61.5 % (ref 43.0–77.0)
Platelets: 206 10*3/uL (ref 150.0–400.0)
RBC: 4.86 Mil/uL (ref 4.22–5.81)
RDW: 13.7 % (ref 11.5–15.5)
WBC: 8.3 10*3/uL (ref 4.0–10.5)

## 2017-12-19 LAB — HEPATIC FUNCTION PANEL
ALT: 28 U/L (ref 0–53)
AST: 21 U/L (ref 0–37)
Albumin: 4.4 g/dL (ref 3.5–5.2)
Alkaline Phosphatase: 81 U/L (ref 39–117)
Bilirubin, Direct: 0.1 mg/dL (ref 0.0–0.3)
Total Bilirubin: 0.5 mg/dL (ref 0.2–1.2)
Total Protein: 6.9 g/dL (ref 6.0–8.3)

## 2017-12-19 LAB — BASIC METABOLIC PANEL
BUN: 15 mg/dL (ref 6–23)
CO2: 29 mEq/L (ref 19–32)
Calcium: 9.3 mg/dL (ref 8.4–10.5)
Chloride: 103 mEq/L (ref 96–112)
Creatinine, Ser: 0.88 mg/dL (ref 0.40–1.50)
GFR: 94.43 mL/min (ref >60.00)
Glucose, Bld: 102 mg/dL — ABNORMAL HIGH (ref 70–99)
Potassium: 4.9 mEq/L (ref 3.5–5.1)
Sodium: 138 mEq/L (ref 135–145)

## 2017-12-19 LAB — LIPID PANEL
Cholesterol: 154 mg/dL (ref 0–200)
HDL: 51.4 mg/dL (ref >39.00)
LDL Cholesterol: 82 mg/dL (ref 0–99)
NonHDL: 102.7
Total CHOL/HDL Ratio: 3
Triglycerides: 105 mg/dL (ref 0.0–149.0)
VLDL: 21 mg/dL (ref 0.0–40.0)

## 2017-12-19 LAB — TSH: TSH: 0.97 u[IU]/mL (ref 0.35–4.50)

## 2017-12-19 LAB — PSA: PSA: 4.74 ng/mL — ABNORMAL HIGH (ref 0.10–4.00)

## 2017-12-19 NOTE — Progress Notes (Signed)
Subjective:     Patient ID: Damon [name], male   DOB: 1960-08-01, 57 y.o.   MRN: [id number]  HPI Patient seen for physical exam.  He had called here recently with substernal chest pressure.  He was advised to go to the ER and patient went and had elevated troponins and ended up having non-ST elevation MI.  He had single-vessel disease with ostial ramus to ramus lesion 99% stenosis.  This was stented successfully.  No residual stenosis.  He has done well since then.  Cardiac rehab is pending.  His Lipitor was increased to 80 mg daily.  He is taking aspirin as well as Brilinta.  Also remains on metoprolol and lisinopril.  No recurrent chest pain.  He has continued to exercise regularly.  Patient has questions regarding measles immunity.  He apparently travels frequently and wants to verify his measles immunity status.  He thinks he had vaccination as a child.  Needs flu vaccine. Had tetanus 2011 Colonoscopy 2016 with recommended 10-year follow-up Hepatitis C screening 2017-  Past Medical History:  Diagnosis Date  . Allergy    seasonal   . Depression    situational   . Heart murmur    hx of in childhood   . Hyperlipidemia   . Hypertension   . Injury of tendon of long head of right biceps    January 2016   Past Surgical History:  Procedure Laterality Date  . APPENDECTOMY    . COLONOSCOPY N/A 11/28/2014   Procedure: COLONOSCOPY;  Surgeon: Louis Meckel, MD;  Location: [facility];  Service: Endoscopy;  Laterality: N/A;  . colonscopy      removed polyps  . CORONARY STENT INTERVENTION N/A 11/20/2017   Procedure: CORONARY STENT INTERVENTION;  Surgeon: Runell Gess, MD;  [facility];  Service: Cardiovascular;  Laterality: N/A;  . INGUINAL HERNIA REPAIR Left 06/12/2014   Procedure: LEFT INGUINAL HERNIA REPAIR WITH MESH;  Surgeon: Darnell Level, MD;  Location: [facility];  Service: General;  Laterality: Left;  . INSERTION OF MESH Left 06/12/2014   Procedure: INSERTION OF  MESH;  Surgeon: Darnell Level, MD;  Location: [facility];  Service: General;  Laterality: Left;  . LEFT HEART CATH AND CORONARY ANGIOGRAPHY N/A 11/20/2017   Procedure: LEFT HEART CATH AND CORONARY ANGIOGRAPHY;  Surgeon: Runell Gess, MD;  [facility];  Service: Cardiovascular;  Laterality: N/A;  . MOUTH SURGERY     wisdom teeth removed;root canal - 03/2014  . TONSILLECTOMY  1995  . TONSILLECTOMY      reports that he quit smoking about 22 years ago. His smoking use included cigarettes. He has a 34.50 pack-year smoking history. He has never used smokeless tobacco. He reports that he drinks alcohol. He reports that he does not use drugs. family history includes AAA (abdominal aortic aneurysm) in his mother; Alzheimer's disease in his mother; Cancer in his other and paternal grandfather; Depression in his other; Heart disease in his other and paternal grandfather; Hyperlipidemia in his other; Hypertension in his other; Stroke in his other. Allergies  Allergen Reactions  . Poison Ivy Extract [Poison Ivy Extract] Itching and Rash  . Poison Oak Extract [Poison Oak Extract] Itching and Rash     Review of Systems  Constitutional: Negative for activity change, appetite change, chills, fatigue and fever.  HENT: Negative for congestion, ear pain and trouble swallowing.   Eyes: Negative for pain and visual disturbance.  Respiratory: Negative for cough, shortness of breath  and wheezing.   Cardiovascular: Negative for chest pain and palpitations.  Gastrointestinal: Negative for abdominal distention, abdominal pain, blood in stool, constipation, diarrhea, nausea, rectal pain and vomiting.  Genitourinary: Negative for dysuria, hematuria and testicular pain.  Musculoskeletal: Negative for arthralgias and joint swelling.  Skin: Negative for rash.  Neurological: Negative for dizziness, syncope and headaches.  Hematological: Negative for adenopathy.  Psychiatric/Behavioral: Negative for  confusion and dysphoric mood.       Objective:   Physical Exam  Constitutional: He is oriented to person, place, and time. He appears well-developed and well-nourished. No distress.  HENT:  Head: Normocephalic and atraumatic.  Right Ear: External ear normal.  Left Ear: External ear normal.  Mouth/Throat: Oropharynx is clear and moist.  Eyes: Pupils are equal, round, and reactive to light. Conjunctivae and EOM are normal.  Neck: Normal range of motion. Neck supple. No thyromegaly present.  Cardiovascular: Normal rate, regular rhythm and normal heart sounds.  No murmur heard. Pulmonary/Chest: No respiratory distress. He has no wheezes. He has no rales.  Abdominal: Soft. Bowel sounds are normal. He exhibits no distension and no mass. There is no tenderness. There is no rebound and no guarding.  Musculoskeletal: He exhibits no edema.  Lymphadenopathy:    He has no cervical adenopathy.  Neurological: He is alert and oriented to person, place, and time. He displays normal reflexes. No cranial nerve deficit.  Skin: No rash noted.  Psychiatric: He has a normal mood and affect.       Assessment:     Physical exam.  Patient had recent non-ST elevation MI and is doing well at this time.  The following health maintenance issues were addressed    Plan:     -Flu vaccine given -We discussed possible Pneumovax but he declines at this time -Obtain screening lab work -We will also check measles mumps rubella immunity because of his frequent travels especially to higher endemic area such as [place].  We consider this a preventative measure. -continue close follow-up with cardiology  Kristian Covey MD [facility]

## 2017-12-19 NOTE — Addendum Note (Signed)
Addended by: Faustino Congress L on: 12/19/2017 01:06 PM   Modules accepted: Orders

## 2017-12-20 ENCOUNTER — Encounter: Payer: Self-pay | Admitting: Family Medicine

## 2017-12-20 ENCOUNTER — Other Ambulatory Visit: Payer: Self-pay | Admitting: Cardiology

## 2017-12-20 DIAGNOSIS — Z9861 Coronary angioplasty status: Principal | ICD-10-CM

## 2017-12-20 DIAGNOSIS — I2510 Atherosclerotic heart disease of native coronary artery without angina pectoris: Secondary | ICD-10-CM

## 2017-12-20 LAB — MEASLES/MUMPS/RUBELLA IMMUNITY
Mumps IgG: 272 AU/mL
Rubella: 2.25 index
Rubeola IgG: 72.2 AU/mL

## 2017-12-20 MED ORDER — TICAGRELOR 90 MG PO TABS: 90.0000 mg | ORAL_TABLET | Freq: Two times a day (BID) | ORAL | 3 refills | Status: DC

## 2017-12-21 ENCOUNTER — Encounter: Payer: Self-pay | Admitting: Sports Medicine

## 2017-12-21 ENCOUNTER — Ambulatory Visit (INDEPENDENT_AMBULATORY_CARE_PROVIDER_SITE_OTHER): Payer: BLUE CROSS/BLUE SHIELD | Admitting: Sports Medicine

## 2017-12-21 VITALS — BP 126/82 | Ht 68.5 in | Wt 170.0 lb

## 2017-12-21 DIAGNOSIS — M545 Low back pain, unspecified: Secondary | ICD-10-CM

## 2017-12-21 DIAGNOSIS — M7701 Medial epicondylitis, right elbow: Secondary | ICD-10-CM

## 2017-12-21 NOTE — Progress Notes (Signed)
  KAMORI BARBIER - 57 y.o. male MRN [id number]  Date of birth: 1960/09/16    SUBJECTIVE:      Chief Complaint:/ HPI:  57 year old male returns with low back pain and right medial elbow pain. -He was seen for similar complaint at his last visit.  At that time he was referred to physical therapy for his low back and provided with offloading brace for his medial epicondylitis.  Shortly following his previous visit, he was hospitalized with an NSTEMI.  He was unable to get into physical therapy due to this.  He is also since discontinued wearing his offloading brace despite reporting benefit.  Today, he denies any new symptoms.  If anything, he has noticed a little bit of improvement in his back pain.  He denies any numbness or tingling down the legs.  No weakness.  No bowel or bladder symptoms.  His x-rays were reviewed with him today.   ROS:     See HPI  PERTINENT  PMH / PSH FH / / SH:  Past Medical, Surgical, Social, and Family History Reviewed & Updated in the EMR.     OBJECTIVE: BP 126/82   Ht 5' 8.5" (1.74 m)   Wt 170 lb (77.1 kg)   BMI 25.47 kg/m   Physical Exam:  Vital signs are reviewed.  GEN: Alert and oriented, NAD Pulm: Breathing unlabored PSY: normal mood, congruent affect  MSK: Lumbar: No obvious deformity No midline tenderness to palpation.  No paraspinal tenderness to palpation. Full range of motion 5/5 strength in lower extremities.  No focal deficits to light touch. Negative FABER and FADIR.  No hip pain with passive IR/ER  Right elbow: No obvious deformity swelling or erythema Tenderness to palpation over the medial epicondyle Full range of motion and full strength in wrist flexion extension N/V intact   ASSESSMENT & PLAN:  1.  Lumbar strain- patient continues to be at baseline with occasional mild low back pain.  His x-rays were independently reviewed today and show very mild degenerative changes with no acute bony normalities.  There were reviewed with him  today.  Patient has no new concerning symptoms and no neurologic deficits on exam - Referral placed again to physical therapy.  He will follow-up in 6 weeks.  No NSAIDs due to his NSTEMI.  Tylenol as needed for pain.  May continue to use topical over-the-counter remedies.  2.  Medial epicondylitis-patient was encouraged to continue to wear the offloading brace since it was helpful for him.  He was also shown some strengthening exercises.  He will follow-up in 6 weeks   I was the preceptor for this visit and available for immediate consultation. Reino Bellis, DO

## 2017-12-25 ENCOUNTER — Other Ambulatory Visit: Payer: Self-pay

## 2017-12-25 ENCOUNTER — Telehealth (HOSPITAL_COMMUNITY): Payer: Self-pay

## 2017-12-25 DIAGNOSIS — R972 Elevated prostate specific antigen [PSA]: Secondary | ICD-10-CM

## 2018-01-01 DIAGNOSIS — F4320 Adjustment disorder, unspecified: Secondary | ICD-10-CM | POA: Diagnosis not present

## 2018-01-08 DIAGNOSIS — R350 Frequency of micturition: Secondary | ICD-10-CM | POA: Diagnosis not present

## 2018-01-08 DIAGNOSIS — N401 Enlarged prostate with lower urinary tract symptoms: Secondary | ICD-10-CM | POA: Diagnosis not present

## 2018-01-08 DIAGNOSIS — R972 Elevated prostate specific antigen [PSA]: Secondary | ICD-10-CM | POA: Diagnosis not present

## 2018-01-10 ENCOUNTER — Telehealth: Payer: Self-pay

## 2018-01-10 NOTE — Telephone Encounter (Signed)
Called patient and LMOVM to return call  [facility] / [facility] / [facility] / Schedule patient  Gave patient message from Dr. Caryl NeverBurchette that he said it is OK for him to wait to see the urologist in May.  CRM Created.

## 2018-01-10 NOTE — Telephone Encounter (Signed)
Please see message. Please advise.  Copied from CRM [id number]. Topic: Referral - Question >> Jan 09, 2018  4:53 PM Lorayne BenderAlexander, Amber L wrote: CRM for notification. See Telephone encounter for: 01/09/18.  Pt states that he can't get in to urologist until May because he was recently stented.  Pt wants to know if this is OK to wait or if Dr. Caryl NeverBurchette would recommend that something else happen for pt at this time. Pt can be reached at [phone]

## 2018-01-10 NOTE — Telephone Encounter (Signed)
OK to wait

## 2018-01-11 NOTE — Telephone Encounter (Signed)
Patient is calling to report he is concerned that his grandfather did have his prostate removed due to prostate cancer. He is so concerned that he has scheduled a second opinion [facility]. It is scheduled for 02/05/18. Karena Addison( Gutierrez-Aceves, MD) He states the cardiologist wants to wait until May to OK the biopsy procedure - due to the Brilinta and aspirin the patient has to take and the vascularity of the prostate.    Please notify Dr Caryl NeverBurchette - he can message or call him.

## 2018-01-12 NOTE — Telephone Encounter (Signed)
Patient called back and was informed of the message below.  Patient stated he would like to have Dr Caryl NeverBurchette call him at [phone].

## 2018-01-12 NOTE — Telephone Encounter (Signed)
Spoke with patient and answered questions to best of my ability.

## 2018-01-12 NOTE — Telephone Encounter (Signed)
I'm not sure what the question is.  I think it is OK to go ahead and see urologist now (ie December) and urologist and cardiologist can discuss timing of biopsy- if indicated.

## 2018-01-12 NOTE — Telephone Encounter (Signed)
I left a message for the pt to return my call. 

## 2018-01-15 ENCOUNTER — Telehealth: Payer: Self-pay | Admitting: *Deleted

## 2018-01-15 DIAGNOSIS — F4320 Adjustment disorder, unspecified: Secondary | ICD-10-CM | POA: Diagnosis not present

## 2018-01-15 NOTE — Telephone Encounter (Signed)
Already discussed with patient.Damon [name].  Co-ordination of timing of prostate biopsies and duration of anti-platelet therapy will have to be made between urology and cardiology.

## 2018-01-15 NOTE — Telephone Encounter (Signed)
Copied from CRM [phone]. Topic: Referral - Question >> Jan 09, 2018  4:53 PM Lorayne BenderAlexander, Amber L wrote: CRM for notification. See Telephone encounter for: 01/09/18.  Pt states that he can't get in to urologist until May because he was recently stented.  Pt wants to know if this is OK to wait or if Dr. Caryl NeverBurchette would recommend that something else happen for pt at this time. Pt can be reached at [phone]

## 2018-01-22 ENCOUNTER — Telehealth: Payer: Self-pay

## 2018-01-22 DIAGNOSIS — R0602 Shortness of breath: Secondary | ICD-10-CM

## 2018-01-22 DIAGNOSIS — Z9861 Coronary angioplasty status: Secondary | ICD-10-CM

## 2018-01-22 DIAGNOSIS — E782 Mixed hyperlipidemia: Secondary | ICD-10-CM | POA: Diagnosis not present

## 2018-01-22 DIAGNOSIS — I2510 Atherosclerotic heart disease of native coronary artery without angina pectoris: Secondary | ICD-10-CM

## 2018-01-22 DIAGNOSIS — R0789 Other chest pain: Secondary | ICD-10-CM | POA: Diagnosis not present

## 2018-01-22 LAB — HEPATIC FUNCTION PANEL
ALT: 27 IU/L (ref 0–44)
AST: 23 IU/L (ref 0–40)
Albumin: 4.4 g/dL (ref 3.5–5.5)
Alkaline Phosphatase: 101 IU/L (ref 39–117)
Bilirubin Total: <0.2 mg/dL (ref 0.0–1.2)
Bilirubin, Direct: 0.07 mg/dL (ref 0.00–0.40)
Total Protein: 6.9 g/dL (ref 6.0–8.5)

## 2018-01-22 LAB — LIPID PANEL
Chol/HDL Ratio: 4.3 ratio (ref 0.0–5.0)
Cholesterol, Total: 175 mg/dL (ref 100–199)
HDL: 41 mg/dL (ref >39)
Triglycerides: 444 mg/dL — ABNORMAL HIGH (ref 0–149)

## 2018-01-22 MED ORDER — TICAGRELOR 90 MG PO TABS: 90.0000 mg | ORAL_TABLET | Freq: Two times a day (BID) | ORAL | 3 refills | Status: DC

## 2018-01-22 NOTE — Telephone Encounter (Signed)
The chest tenderness is not heart related (if you can touch it and it hurts it is something chest wall-related. The shortness of breath could be due to the weakening of the heart muscle from the heart attack. Please schedule for an echo for dyspnea between now and his follow up.

## 2018-01-22 NOTE — Telephone Encounter (Signed)
Patient walked in with questions while getting lab work. Patient recently evaluated by Dr Sande BrothersWinters (urology) for elevated prostate. MD recommended biopsy as pt has fhx of prostate cancer. Pt is on Brilinta and Aspirin and would need to come off both medications for procedure.   Discussed this matter with Dr Royann Shiversroitoru. Patient stented 11/20/17. Seen in office by Damon CourseHao Meng, PA on 12/05/17. Damon [name] recommended to not stop DAPT within the first 3 months of treatment. Risk thereafter, is not 0, but is more negotiable. He has an appointment on 02/26/18. Will discuss further at that office visit.  Patient notified of above conversation, verbalized understanding, and agreed with plan.  Patient's significant other, Damon [name], then presented concern for patient's labored breathing. She reports she notices it at night and sometimes, it will wake her from sleep. Patient does agree with this report and has had employees question his breathing on exertion. Patient also reports some slight chest pressure. Nothing like the pain/pressure symptoms he had prior to heart attack/hospitalization in September. He points to an applies pressure to the left side of his chest and says its tender. Has NTG, hasn't used it for this discomfort.   They both wonder if they should be seen sooner than January? Please advise?

## 2018-01-23 ENCOUNTER — Other Ambulatory Visit: Payer: Self-pay | Admitting: Physician Assistant

## 2018-01-23 ENCOUNTER — Telehealth: Payer: Self-pay | Admitting: Physician Assistant

## 2018-01-23 DIAGNOSIS — E785 Hyperlipidemia, unspecified: Secondary | ICD-10-CM

## 2018-01-23 NOTE — Telephone Encounter (Signed)
° ° °  Patient calling back to discuss lab results again. [place] [employer] and ate fatty butter/milk, he feels this could be the reason triglycerides increased

## 2018-01-23 NOTE — Telephone Encounter (Signed)
Routed to PA & [place] LPN

## 2018-01-23 NOTE — Telephone Encounter (Signed)
Damon [name] is calling patient to discuss.

## 2018-01-23 NOTE — Telephone Encounter (Signed)
I have personally called Mr. Damon [name], it is possible the lab is faulty, I recommend repeat FL[facility] lab in 1 month right before his visit with Dr. Royann Shiversroitoru. Lab order placed in chart, patient informed to come back in 1 month.

## 2018-01-24 NOTE — Telephone Encounter (Signed)
Echo ordered. Messaged routed to admin for scheduling.

## 2018-01-25 ENCOUNTER — Ambulatory Visit (HOSPITAL_COMMUNITY): Payer: BLUE CROSS/BLUE SHIELD | Attending: Cardiovascular Disease

## 2018-01-25 ENCOUNTER — Other Ambulatory Visit: Payer: Self-pay

## 2018-01-25 DIAGNOSIS — R0602 Shortness of breath: Secondary | ICD-10-CM | POA: Diagnosis not present

## 2018-01-25 MED ORDER — PERFLUTREN LIPID MICROSPHERE
1.0000 mL | INTRAVENOUS | Status: AC | PRN
  Administered 2018-01-25: 2 mL via INTRAVENOUS

## 2018-01-26 DIAGNOSIS — F4320 Adjustment disorder, unspecified: Secondary | ICD-10-CM | POA: Diagnosis not present

## 2018-02-02 NOTE — Progress Notes (Signed)
Damon SaasHarry S [name] 57 y.o. male DOB 07/28/1960 MRN [id number]       Nutrition  No diagnosis found. Past Medical History:  Diagnosis Date  . Allergy    seasonal   . Depression    situational   . Heart murmur    hx of in childhood   . Hyperlipidemia   . Hypertension   . Injury of tendon of long head of right biceps    January 2016   Meds reviewed.     Current Outpatient Medications (Cardiovascular):  .  atorvastatin (LIPITOR) 80 MG tablet, Take 1 tablet (80 mg total) by mouth daily at 6 PM. .  lisinopril (PRINIVIL,ZESTRIL) 20 MG tablet, Take 1 tablet (20 mg total) by mouth daily. .  metoprolol succinate (TOPROL XL) 25 MG 24 hr tablet, Take 1 tablet (25 mg total) by mouth daily. .  nitroGLYCERIN (NITROSTAT) 0.4 MG SL tablet, Place 1 tablet (0.4 mg total) under the tongue every 5 (five) minutes x 3 doses as needed for chest pain.  Current Outpatient Medications (Respiratory):  .  cetirizine (ZYRTEC) 10 MG tablet, Take 10 mg by mouth 2 (two) times daily as needed.   Current Outpatient Medications (Analgesics):  .  aspirin EC 81 MG EC tablet, Take 1 tablet (81 mg total) by mouth daily.  Current Outpatient Medications (Hematological):  .  ticagrelor (BRILINTA) 90 MG TABS tablet, Take 1 tablet (90 mg total) by mouth 2 (two) times daily.  Current Outpatient Medications (Other):  .  clonazePAM (KLONOPIN) 0.5 MG tablet, Take 0.5-1 mg by mouth See admin instructions. Take 2 tablets in the morning and 1 tablet in the evening. May take 1 additional tablet as needed for anxiety .  lisdexamfetamine (VYVANSE) 30 MG capsule, Take 30 mg by mouth as needed.    HT: Ht Readings from Last 1 Encounters:  12/21/17 5' 8.5" (1.74 m)    WT: Wt Readings from Last 5 Encounters:  12/21/17 170 lb (77.1 kg)  12/19/17 176 lb 4.8 oz (80 kg)  12/05/17 175 lb (79.4 kg)  11/20/17 171 lb 3.2 oz (77.7 kg)  11/09/17 170 lb (77.1 kg)     BMI 25.47    12/21/17  Current tobacco use? No       Labs:  Lipid  Panel     Component Value Date/Time   CHOL 175 01/22/2018 0906   TRIG 444 (H) 01/22/2018 0906   HDL 41 01/22/2018 0906   CHOLHDL 4.3 01/22/2018 0906   CHOLHDL 3 12/19/2017 1219   VLDL 21.0 12/19/2017 1219   LDLCALC Comment 01/22/2018 0906   LDLDIRECT 93.0 12/09/2015 0820    No results found for: HGBA1C CBG (last 3)  No results for input(s): GLUCAP in the last 72 hours.  Nutrition Diagnosis ? Food-and nutrition-related knowledge deficit related to lack of exposure to information as related to diagnosis of: ? CVD  ? Overweight  related to excessive energy intake as evidenced by a BMI of 25.47 Nutrition Goal(s):  ? To be determined  Plan:  Pt to attend nutrition classes ? Nutrition I ? Nutrition II ? Portion Distortion  Will provide client-centered nutrition education as part of interdisciplinary care.   Monitor and evaluate progress toward nutrition goal with team.  Ross MarcusAubrey Burklin, MS, RD, LDN 02/02/2018 2:21 PM

## 2018-02-06 ENCOUNTER — Telehealth (HOSPITAL_COMMUNITY): Payer: Self-pay | Admitting: Pharmacist

## 2018-02-06 DIAGNOSIS — F4320 Adjustment disorder, unspecified: Secondary | ICD-10-CM | POA: Diagnosis not present

## 2018-02-06 NOTE — Telephone Encounter (Signed)
Cardiac Rehab Medication Review by a Pharmacist  Does the patient  feel that his/her medications are working for him/her?  yes  Has the patient been experiencing any side effects to the medications prescribed?  yes  Does the patient measure his/her own blood pressure or blood glucose at home?  yes   Does the patient have any problems obtaining medications due to transportation or finances?   yes  Understanding of regimen: good Understanding of indications: good Potential of compliance: good    Pharmacist comments: Patient is only wanting his cardiac medications sent to [facility].    Thank you for allowing pharmacy to be a part of this patient's care.  Bradley FerrisJoshua Kaian Fahs, PharmD 02/06/2018 2:38 PM PGY-1 Pharmacy Resident Direct Phone: [phone] Please check [email or web address] for unit-specific pharmacist phone numbers

## 2018-02-08 ENCOUNTER — Encounter (HOSPITAL_COMMUNITY)
Admission: RE | Admit: 2018-02-08 | Discharge: 2018-02-08 | Disposition: A | Discharge status: Home / Self Care | Payer: BLUE CROSS/BLUE SHIELD | Source: Ambulatory Visit | Attending: Cardiovascular Disease | Admitting: Cardiovascular Disease

## 2018-02-08 ENCOUNTER — Encounter (HOSPITAL_COMMUNITY): Payer: Self-pay

## 2018-02-08 VITALS — BP 104/74 | HR 74 | Ht 68.0 in | Wt 178.1 lb

## 2018-02-08 DIAGNOSIS — I214 Non-ST elevation (NSTEMI) myocardial infarction: Secondary | ICD-10-CM

## 2018-02-08 DIAGNOSIS — Z955 Presence of coronary angioplasty implant and graft: Secondary | ICD-10-CM

## 2018-02-08 HISTORY — DX: Atherosclerotic heart disease of native coronary artery without angina pectoris: I25.10

## 2018-02-08 NOTE — Progress Notes (Signed)
Cardiac Individual Treatment Plan  Patient Details  Name: Damon [name] MRN: [id number] Date of Birth: 07/12/1960 Referring Provider:     CARDIAC REHAB PHASE II [facility]ATION [facility]08/[facility]  Referring Provider  Dr. Royann Shiversroitoru      Initial Encounter Date:    CARDIAC REHAB PHASE II ORIENTATION from 02/08/2018 [facility]  Date  02/08/18      Visit Diagnosis: NSTEMI (non-ST elevated myocardial infarction) (HCC) 11/20/17  Status post coronary artery stent placement 11/20/17 DES CFX  Patient's Home Medications on Admission:  Current Outpatient Medications:  .  aspirin EC 81 MG EC tablet, Take 1 tablet (81 mg total) by mouth daily., Disp: , Rfl:  .  atorvastatin (LIPITOR) 80 MG tablet, Take 1 tablet (80 mg total) by mouth daily at 6 PM., Disp: 90 tablet, Rfl: 2 .  cetirizine (ZYRTEC) 10 MG tablet, Take 10 mg by mouth 2 (two) times daily as needed. , Disp: , Rfl:  .  clonazePAM (KLONOPIN) 0.5 MG tablet, Take 0.5-1 mg by mouth See admin instructions. Take 2 tablets in the morning and 1 tablet in the evening. May take 1 additional tablet as needed for anxiety, Disp: , Rfl:  .  lisdexamfetamine (VYVANSE) 30 MG capsule, Take 30 mg by mouth as needed. , Disp: , Rfl:  .  lisinopril (PRINIVIL,ZESTRIL) 20 MG tablet, Take 1 tablet (20 mg total) by mouth daily., Disp: 90 tablet, Rfl: 2 .  metoprolol succinate (TOPROL XL) 25 MG 24 hr tablet, Take 1 tablet (25 mg total) by mouth daily., Disp: 90 tablet, Rfl: 2 .  nitroGLYCERIN (NITROSTAT) 0.4 MG SL tablet, Place 1 tablet (0.4 mg total) under the tongue every 5 (five) minutes x 3 doses as needed for chest pain., Disp: 25 tablet, Rfl: 1 .  ticagrelor (BRILINTA) 90 MG TABS tablet, Take 1 tablet (90 mg total) by mouth 2 (two) times daily., Disp: 180 tablet, Rfl: 3  Past Medical History: Past Medical History:  Diagnosis Date  . Allergy    seasonal   . Coronary artery disease   .  Depression    situational   . Heart murmur    hx of in childhood   . Hyperlipidemia   . Hypertension   . Injury of tendon of long head of right biceps    January 2016    Tobacco Use: Social History   Tobacco Use  Smoking Status Former Smoker  . Packs/day: 1.50  . Years: 23.00  . Pack years: 34.50  . Types: Cigarettes  . Last attempt to quit: 02/22/1995  . Years since quitting: 22.9  Smokeless Tobacco Never Used    Labs: Recent Review Flowsheet Data    Labs for ITP Cardiac and Pulmonary Rehab Latest Ref Rng & Units 12/09/2015 12/28/2016 11/18/2017 12/19/2017 01/22/2018   Cholestrol 100 - 199 mg/dL 409177 [id number] 914180 [id number]   LDLCALC 0 - 99 mg/dL - 85 99 82 Comment   LDLDIRECT mg/dL 21.393.0 - - - -   HDL >08>39 mg/dL 65.7839.40 46.96(E38.20(L) 48 95.2851.40 41   Trlycerides 0 - 149 mg/dL 413365.0(H) 124.0 165(H) 105.0 444(H)      Capillary Blood Glucose: No results found for: GLUCAP   Exercise Target Goals: Exercise Program Goal: Individual exercise prescription set using results from initial 6 min walk test and THRR while considering  patient's activity barriers and safety.   Exercise Prescription Goal: Initial exercise prescription builds to 30-45 minutes a day  of aerobic activity, 2-3 days per week.  Home exercise guidelines will be given to patient during program as part of exercise prescription that the participant will acknowledge.  Activity Barriers & Risk Stratification: Activity Barriers & Cardiac Risk Stratification - 02/08/18 1154      Activity Barriers & Cardiac Risk Stratification   Activity Barriers  None    Cardiac Risk Stratification  High       6 Minute Walk: 6 Minute Walk    Row Name 02/08/18 1153         6 Minute Walk   Phase  Initial     Distance  2032 feet     Walk Time  6 minutes     # of Rest Breaks  0     MPH  3.8     METS  4.9     RPE  9     VO2 Peak  17.07     Symptoms  No     Resting HR  70 bpm     Resting BP  104/74     Resting Oxygen Saturation   97 %      Exercise Oxygen Saturation  during 6 min walk  99 %     Max Ex. HR  108 bpm     Max Ex. BP  122/78     2 Minute Post BP  108/72        Oxygen Initial Assessment:   Oxygen Re-Evaluation:   Oxygen Discharge (Final Oxygen Re-Evaluation):   Initial Exercise Prescription: Initial Exercise Prescription - 02/08/18 1100      Date of Initial Exercise RX and Referring Provider   Date  02/08/18    Referring Provider  Dr. Royann Shivers    Expected Discharge Date  05/18/18      Treadmill   MPH  3.8    Grade  2    Minutes  10      Elliptical   Level  2    Speed  2    Minutes  10      Rower   Level  4    Watts  60    Minutes  10      Prescription Details   Frequency (times per week)  3    Duration  Progress to 30 minutes of continuous aerobic without signs/symptoms of physical distress      Intensity   THRR 40-80% of Max Heartrate  65-130    Ratings of Perceived Exertion  11-13      Progression   Progression  Continue to progress workloads to maintain intensity without signs/symptoms of physical distress.      Resistance Training   Training Prescription  Yes    Weight  5 lbs.     Reps  10-15       Perform Capillary Blood Glucose checks as needed.  Exercise Prescription Changes:   Exercise Comments:   Exercise Goals and Review:  Exercise Goals    Row Name 02/08/18 1156             Exercise Goals   Increase Physical Activity  Yes       Intervention  Provide advice, education, support and counseling about physical activity/exercise needs.;Develop an individualized exercise prescription for aerobic and resistive training based on initial evaluation findings, risk stratification, comorbidities and participant's personal goals.       Expected Outcomes  Short Term: Attend rehab on a regular basis to increase amount of physical activity.;Long Term:  Add in home exercise to make exercise part of routine and to increase amount of physical activity.;Long Term:  Exercising regularly at least 3-5 days a week.       Increase Strength and Stamina  Yes       Intervention  Provide advice, education, support and counseling about physical activity/exercise needs.;Develop an individualized exercise prescription for aerobic and resistive training based on initial evaluation findings, risk stratification, comorbidities and participant's personal goals.       Expected Outcomes  Short Term: Increase workloads from initial exercise prescription for resistance, speed, and METs.;Short Term: Perform resistance training exercises routinely during rehab and add in resistance training at home;Long Term: Improve cardiorespiratory fitness, muscular endurance and strength as measured by increased METs and functional capacity ( )       Able to understand and use rate of perceived exertion (RPE) scale  Yes       Intervention  Provide education and explanation on how to use RPE scale       Expected Outcomes  Short Term: Able to use RPE daily in rehab to express subjective intensity level;Long Term:  Able to use RPE to guide intensity level when exercising independently       Knowledge and understanding of Target Heart Rate Range (THRR)  Yes       Intervention  Provide education and explanation of THRR including how the numbers were predicted and where they are located for reference       Expected Outcomes  Short Term: Able to state/look up THRR;Long Term: Able to use THRR to govern intensity when exercising independently;Short Term: Able to use daily as guideline for intensity in rehab       Able to check pulse independently  Yes       Intervention  Provide education and demonstration on how to check pulse in carotid and radial arteries.;Review the importance of being able to check your own pulse for safety during independent exercise       Expected Outcomes  Short Term: Able to explain why pulse checking is important during independent exercise;Long Term: Able to check pulse  independently and accurately       Understanding of Exercise Prescription  Yes       Intervention  Provide education, explanation, and written materials on patient's individual exercise prescription       Expected Outcomes  Short Term: Able to explain program exercise prescription;Long Term: Able to explain home exercise prescription to exercise independently          Exercise Goals Re-Evaluation :   Discharge Exercise Prescription (Final Exercise Prescription Changes):   Nutrition:  Target Goals: Understanding of nutrition guidelines, daily intake of sodium 1500mg , cholesterol 200mg , calories 30% from fat and 7% or less from saturated fats, daily to have 5 or more servings of fruits and vegetables.  Biometrics: Pre Biometrics - 02/08/18 1157      Pre Biometrics   Height  5\' 8"  (1.727 m)    Weight  80.8 kg    Waist Circumference  38.5 inches    Hip Circumference  41 inches    Waist to Hip Ratio  0.94 %    BMI (Calculated)  27.09    Triceps Skinfold  19 mm    % Body Fat  27.3 %    Grip Strength  39 kg    Flexibility  17.5 in    Single Leg Stand  30 seconds        Nutrition Therapy Plan  and Nutrition Goals:   Nutrition Assessments:   Nutrition Goals Re-Evaluation:   Nutrition Goals Re-Evaluation:   Nutrition Goals Discharge (Final Nutrition Goals Re-Evaluation):   Psychosocial: Target Goals: Acknowledge presence or absence of significant depression and/or stress, maximize coping skills, provide positive support system. Participant is able to verbalize types and ability to use techniques and skills needed for reducing stress and depression.  Initial Review & Psychosocial Screening: Initial Psych Review & Screening - 02/08/18 1221      Initial Review   Current issues with  Current Stress Concerns    Source of Stress Concerns  Family;Occupation    Comments  Abdoul's mother has Alzheimer's. 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      Family Dynamics   Good Support System?  Yes      Barriers   Psychosocial barriers to participate in program  The patient should benefit from training in stress management and relaxation.      Screening Interventions   Interventions  Encouraged to exercise    Expected Outcomes  Short Term goal: Utilizing psychosocial counselor, staff and physician to assist with identification of specific Stressors or current issues interfering with healing process. Setting desired goal for each stressor or current issue identified.;Long Term Goal: Stressors or current issues are controlled or eliminated.       Quality of Life Scores: Quality of Life - 02/08/18 1222      Quality of Life   Select  Quality of Life      Quality of Life Scores   Health/Function Pre  26.3 %    Socioeconomic Pre  27.43 %    Psych/Spiritual Pre  28.07 %    Family Pre  26.4 %    GLOBAL Pre  26.91 %      Scores of 19 and below usually indicate a poorer quality of life in these areas.  A difference of  2-3 points is a clinically meaningful difference.  A difference of 2-3 points in the total score of the Quality of Life Index has been associated with significant improvement in overall quality of life, self-image, physical symptoms, and general health in studies assessing change in quality of life.  PHQ-9: Recent Review Flowsheet Data    Depression screen [facility] 2/9 03/11/2016 04/23/2015 07/16/2014   Decreased Interest 0 0 0   Down, Depressed, Hopeless 0 0 0   PHQ - 2 Score 0 0 0     Interpretation of Total Score  Total Score Depression Severity:  1-4 = Minimal depression, 5-9 = Mild depression, 10-14 = Moderate depression, 15-19 = Moderately severe depression, 20-27 = Severe depression   Psychosocial Evaluation and Intervention:   Psychosocial Re-Evaluation:   Psychosocial Discharge (Final Psychosocial Re-Evaluation):   Vocational Rehabilitation: Provide vocational rehab assistance to qualifying  candidates.   Vocational Rehab Evaluation & Intervention: Vocational Rehab - 02/08/18 1223      Initial Vocational Rehab Evaluation & Intervention   Assessment shows need for Vocational Rehabilitation  No   Rhythm does not need vocational rehab at this time Simultaneous filing. User may not have seen previous data.      Education: Education Goals: Education classes will be provided on a weekly basis, covering required topics. Participant will state understanding/return demonstration of topics presented.  Learning Barriers/Preferences: Learning Barriers/Preferences - 02/08/18 1158      Learning Barriers/Preferences   Learning Barriers  Sight    Learning Preferences  Audio;Computer/Internet;Group Instruction;Individual  Instruction;Pictoral;Skilled Demonstration;Verbal Instruction;Video;Written Material       Education Topics: Count Your Pulse:  -Group instruction provided by verbal instruction, demonstration, patient participation and written materials to support subject.  Instructors address importance of being able to find your pulse and how to count your pulse when at home without a heart monitor.  Patients get hands on experience counting their pulse with staff help and individually.   Heart Attack, Angina, and Risk Factor Modification:  -Group instruction provided by verbal instruction, video, and written materials to support subject.  Instructors address signs and symptoms of angina and heart attacks.    Also discuss risk factors for heart disease and how to make changes to improve heart health risk factors.   Functional Fitness:  -Group instruction provided by verbal instruction, demonstration, patient participation, and written materials to support subject.  Instructors address safety measures for doing things around the house.  Discuss how to get up and down off the floor, how to pick things up properly, how to safely get out of a chair without assistance, and balance  training.   Meditation and Mindfulness:  -Group instruction provided by verbal instruction, patient participation, and written materials to support subject.  Instructor addresses importance of mindfulness and meditation practice to help reduce stress and improve awareness.  Instructor also leads participants through a meditation exercise.    Stretching for Flexibility and Mobility:  -Group instruction provided by verbal instruction, patient participation, and written materials to support subject.  Instructors lead participants through series of stretches that are designed to increase flexibility thus improving mobility.  These stretches are additional exercise for major muscle groups that are typically performed during regular warm up and cool down.   Hands Only CPR:  -Group verbal, video, and participation provides a basic overview of AHA guidelines for community CPR. Role-play of emergencies allow participants the opportunity to practice calling for help and chest compression technique with discussion of AED use.   Hypertension: -Group verbal and written instruction that provides a basic overview of hypertension including the most recent diagnostic guidelines, risk factor reduction with self-care instructions and medication management.    Nutrition I class: Heart Healthy Eating:  -Group instruction provided by PowerPoint slides, verbal discussion, and written materials to support subject matter. The instructor gives an explanation and review of the Therapeutic Lifestyle Changes diet recommendations, which includes a discussion on lipid goals, dietary fat, sodium, fiber, plant stanol/sterol esters, sugar, and the components of a well-balanced, healthy diet.   Nutrition II class: Lifestyle Skills:  -Group instruction provided by PowerPoint slides, verbal discussion, and written materials to support subject matter. The instructor gives an explanation and review of label reading, grocery  shopping for heart health, heart healthy recipe modifications, and ways to make healthier choices when eating out.   Diabetes Question & Answer:  -Group instruction provided by PowerPoint slides, verbal discussion, and written materials to support subject matter. The instructor gives an explanation and review of diabetes co-morbidities, pre- and post-prandial blood glucose goals, pre-exercise blood glucose goals, signs, symptoms, and treatment of hypoglycemia and hyperglycemia, and foot care basics.   Diabetes Blitz:  -Group instruction provided by PowerPoint slides, verbal discussion, and written materials to support subject matter. The instructor gives an explanation and review of the physiology behind type 1 and type 2 diabetes, diabetes medications and rational behind using different medications, pre- and post-prandial blood glucose recommendations and Hemoglobin A1c goals, diabetes diet, and exercise including blood glucose guidelines for exercising safely.  Portion Distortion:  -Group instruction provided by PowerPoint slides, verbal discussion, written materials, and food models to support subject matter. The instructor gives an explanation of serving size versus portion size, changes in portions sizes over the last 20 years, and what consists of a serving from each food group.   Stress Management:  -Group instruction provided by verbal instruction, video, and written materials to support subject matter.  Instructors review role of stress in heart disease and how to cope with stress positively.     Exercising on Your Own:  -Group instruction provided by verbal instruction, power point, and written materials to support subject.  Instructors discuss benefits of exercise, components of exercise, frequency and intensity of exercise, and end points for exercise.  Also discuss use of nitroglycerin and activating EMS.  Review options of places to exercise outside of rehab.  Review guidelines  for sex with heart disease.   Cardiac Drugs I:  -Group instruction provided by verbal instruction and written materials to support subject.  Instructor reviews cardiac drug classes: antiplatelets, anticoagulants, beta blockers, and statins.  Instructor discusses reasons, side effects, and lifestyle considerations for each drug class.   Cardiac Drugs II:  -Group instruction provided by verbal instruction and written materials to support subject.  Instructor reviews cardiac drug classes: angiotensin converting enzyme inhibitors (ACE-I), angiotensin II receptor blockers (ARBs), nitrates, and calcium channel blockers.  Instructor discusses reasons, side effects, and lifestyle considerations for each drug class.   Anatomy and Physiology of the Circulatory System:  Group verbal and written instruction and models provide basic cardiac anatomy and physiology, with the coronary electrical and arterial systems. Review of: AMI, Angina, Valve disease, Heart Failure, Peripheral Artery Disease, Cardiac Arrhythmia, Pacemakers, and the ICD.   Other Education:  -Group or individual verbal, written, or video instructions that support the educational goals of the cardiac rehab program.   Holiday Eating Survival Tips:  -Group instruction provided by PowerPoint slides, verbal discussion, and written materials to support subject matter. The instructor gives patients tips, tricks, and techniques to help them not only survive but enjoy the holidays despite the onslaught of food that accompanies the holidays.   Knowledge Questionnaire Score: Knowledge Questionnaire Score - 02/08/18 1201      Knowledge Questionnaire Score   Pre Score  16/24       Core Components/Risk Factors/Patient Goals at Admission: Personal Goals and Risk Factors at Admission - 02/08/18 1158      Core Components/Risk Factors/Patient Goals on Admission    Weight Management  Yes;Weight Maintenance;Weight Loss    Intervention  Weight  Management: Develop a combined nutrition and exercise program designed to reach desired caloric intake, while maintaining appropriate intake of nutrient and fiber, sodium and fats, and appropriate energy expenditure required for the weight goal.;Weight Management: Provide education and appropriate resources to help participant work on and attain dietary goals.;Weight Management/Obesity: Establish reasonable short term and long term weight goals.    Admit Weight  178 lb 2.1 oz (80.8 kg)    Expected Outcomes  Short Term: Continue to assess and modify interventions until short term weight is achieved;Long Term: Adherence to nutrition and physical activity/exercise program aimed toward attainment of established weight goal;Weight Maintenance: Understanding of the daily nutrition guidelines, which includes 25-35% calories from fat, 7% or less cal from saturated fats, less than 200mg  cholesterol, less than 1.5gm of sodium, & 5 or more servings of fruits and vegetables daily;Weight Loss: Understanding of general recommendations for a balanced deficit meal plan, which  promotes 1-2 lb weight loss per week and includes a negative energy balance of [phone] kcal/d;Understanding recommendations for meals to include 15-35% energy as protein, 25-35% energy from fat, 35-60% energy from carbohydrates, less than 200mg  of dietary cholesterol, 20-35 gm of total fiber daily;Understanding of distribution of calorie intake throughout the day with the consumption of 4-5 meals/snacks    Hypertension  Yes    Intervention  Provide education on lifestyle modifcations including regular physical activity/exercise, weight management, moderate sodium restriction and increased consumption of fresh fruit, vegetables, and low fat dairy, alcohol moderation, and smoking cessation.;Monitor prescription use compliance.    Expected Outcomes  Short Term: Continued assessment and intervention until BP is < 140/3990mm HG in hypertensive participants. <  130/7980mm HG in hypertensive participants with diabetes, heart failure or chronic kidney disease.;Long Term: Maintenance of blood pressure at goal levels.    Lipids  Yes    Intervention  Provide education and support for participant on nutrition & aerobic/resistive exercise along with prescribed medications to achieve LDL 70mg , HDL >40mg .    Expected Outcomes  Short Term: Participant states understanding of desired cholesterol values and is compliant with medications prescribed. Participant is following exercise prescription and nutrition guidelines.;Long Term: Cholesterol controlled with medications as prescribed, with individualized exercise RX and with personalized nutrition plan. Value goals: LDL < 70mg , HDL > 40 mg.    Stress  Yes    Intervention  Offer individual and/or small group education and counseling on adjustment to heart disease, stress management and health-related lifestyle change. Teach and support self-help strategies.;Refer participants experiencing significant psychosocial distress to appropriate mental health specialists for further evaluation and treatment. When possible, include family members and significant others in education/counseling sessions.    Expected Outcomes  Short Term: Participant demonstrates changes in health-related behavior, relaxation and other stress management skills, ability to obtain effective social support, and compliance with psychotropic medications if prescribed.;Long Term: Emotional wellbeing is indicated by absence of clinically significant psychosocial distress or social isolation.       Core Components/Risk Factors/Patient Goals Review:    Core Components/Risk Factors/Patient Goals at Discharge (Final Review):    ITP Comments: ITP Comments    Row Name 02/08/18 1153           ITP Comments  Dr. Armanda Magicraci Turner, Medical Director          Comments: Lollie SailsHarry attended orientation from [phone] to [phone] to review rules and guidelines for program.  Completed 6 minute walk test, Intitial ITP, and exercise prescription.  VSS. Telemetry-Sinus Rhythm.  Asymptomatic.Gladstone LighterMaria Ionia Schey, RN,BSN 02/08/2018 12:37 PM

## 2018-02-08 NOTE — Progress Notes (Signed)
Hulan SaasHarry S Kimberling 57 y.o. male DOB: 12/18/1960 MRN: [id number]      Nutrition Note  1. NSTEMI (non-ST elevated myocardial infarction) (HCC) 11/20/17   2. Status post coronary artery stent placement 11/20/17 DES CFX    Past Medical History:  Diagnosis Date  . Allergy    seasonal   . Coronary artery disease   . Depression    situational   . Heart murmur    hx of in childhood   . Hyperlipidemia   . Hypertension   . Injury of tendon of long head of right biceps    January 2016   Meds reviewed.    Current Outpatient Medications (Cardiovascular):  .  atorvastatin (LIPITOR) 80 MG tablet, Take 1 tablet (80 mg total) by mouth daily at 6 PM. .  lisinopril (PRINIVIL,ZESTRIL) 20 MG tablet, Take 1 tablet (20 mg total) by mouth daily. .  metoprolol succinate (TOPROL XL) 25 MG 24 hr tablet, Take 1 tablet (25 mg total) by mouth daily. .  nitroGLYCERIN (NITROSTAT) 0.4 MG SL tablet, Place 1 tablet (0.4 mg total) under the tongue every 5 (five) minutes x 3 doses as needed for chest pain.  Current Outpatient Medications (Respiratory):  .  cetirizine (ZYRTEC) 10 MG tablet, Take 10 mg by mouth 2 (two) times daily as needed.   Current Outpatient Medications (Analgesics):  .  aspirin EC 81 MG EC tablet, Take 1 tablet (81 mg total) by mouth daily.  Current Outpatient Medications (Hematological):  .  ticagrelor (BRILINTA) 90 MG TABS tablet, Take 1 tablet (90 mg total) by mouth 2 (two) times daily.  Current Outpatient Medications (Other):  .  clonazePAM (KLONOPIN) 0.5 MG tablet, Take 0.5-1 mg by mouth See admin instructions. Take 2 tablets in the morning and 1 tablet in the evening. May take 1 additional tablet as needed for anxiety .  lisdexamfetamine (VYVANSE) 30 MG capsule, Take 30 mg by mouth as needed.    HT: Ht Readings from Last 1 Encounters:  02/08/18 5\' 8"  (1.727 m)    WT: Wt Readings from Last 5 Encounters:  02/08/18 178 lb 2.1 oz (80.8 kg)  12/21/17 170 lb (77.1 kg)  12/19/17 176 lb 4.8  oz (80 kg)  12/05/17 175 lb (79.4 kg)  11/20/17 171 lb 3.2 oz (77.7 kg)     Body mass index is 27.08 kg/m.   Current tobacco use? No  Labs:  Lipid Panel     Component Value Date/Time   CHOL 175 01/22/2018 0906   TRIG 444 (H) 01/22/2018 0906   HDL 41 01/22/2018 0906   CHOLHDL 4.3 01/22/2018 0906   CHOLHDL 3 12/19/2017 1219   VLDL 21.0 12/19/2017 1219   LDLCALC Comment 01/22/2018 0906   LDLDIRECT 93.0 12/09/2015 0820    No results found for: HGBA1C CBG (last 3)  No results for input(s): GLUCAP in the last 72 hours.  Nutrition Note Spoke with pt. Nutrition plan and goals reviewed with pt. Pt is following Step 2 of the Therapeutic Lifestyle Changes diet. Pt wants to continue to eat heart healthy. Heart healthy eating tips reviewed (label reading, how to build a healthy plate, portion sizes, eating frequently across the day). Discussed heart healthy recipes and websites with patient. Distributed recipes for pt and his wife to try. Discussed with patient the importance of eating regularly across the day, recommended pt eat a minimum of 3 times a day. Set goal with patient to increase number of meals daily, try 1 new recipe a week, and  to start to read food labels. Per discussion, pt does not use canned/convenience foods often. Pt does not add salt to food. Pt does not eat out frequently. Pt expressed understanding of the information reviewed. Pt aware of nutrition education classes offered and would like to attend nutrition classes.  Nutrition Diagnosis ? Food-and nutrition-related knowledge deficit related to lack of exposure to information as related to diagnosis of: ? CVD   Nutrition Intervention ? Pt's individual nutrition plan and goals reviewed with pt. ? Pt given handouts for: ? Nutrition I class ? Nutrition II class   Nutrition Goal(s):   ? Pt to identify and limit food sources of saturated fat, trans fat, refined carbohydrates and sodium ? Pt to eat regularly across the  day ? Pt to eat more of and a variety of non-starchy vegetables.   Plan:  ? Pt to attend nutrition classes ? Nutrition I ? Nutrition II ? Portion Distortion  ? Will provide client-centered nutrition education as part of interdisciplinary care ? Monitor and evaluate progress toward nutrition goal with team.   Ross MarcusAubrey Burklin, MS, RD, LDN 02/08/2018 4:06 PM

## 2018-02-12 ENCOUNTER — Encounter (HOSPITAL_COMMUNITY): Payer: BLUE CROSS/BLUE SHIELD

## 2018-02-16 ENCOUNTER — Encounter (HOSPITAL_COMMUNITY)
Admission: RE | Admit: 2018-02-16 | Discharge: 2018-02-16 | Disposition: A | Discharge status: Home / Self Care | Payer: BLUE CROSS/BLUE SHIELD | Source: Ambulatory Visit | Attending: Cardiovascular Disease | Admitting: Cardiovascular Disease

## 2018-02-16 ENCOUNTER — Encounter (HOSPITAL_COMMUNITY): Payer: BLUE CROSS/BLUE SHIELD

## 2018-02-16 DIAGNOSIS — I214 Non-ST elevation (NSTEMI) myocardial infarction: Secondary | ICD-10-CM | POA: Diagnosis not present

## 2018-02-16 DIAGNOSIS — Z955 Presence of coronary angioplasty implant and graft: Secondary | ICD-10-CM | POA: Diagnosis not present

## 2018-02-16 NOTE — Progress Notes (Signed)
Daily Session Note  Patient Details  Name: Damon [name] MRN: [id number] Date of Birth: [birth year] Referring Provider:     CARDIAC REHAB PHASE II ORIENTATION from 02/08/2018 in [facility]  Referring Provider  Dr. Sallyanne Kuster      Encounter Date: 02/16/2018  Check In: Session Check In - 02/16/18 0839      Check-In   Supervising physician immediately available to respond to emergencies  [facility] immediately available    Physician(s)  Dr. Tana Coast    Location  [facility]    Staff Present  Joycelyn Man RN, BSN;Dalton Kris Mouton, MS, Exercise Physiologist;Tyara Carol Ada, MS,ACSM CEP, Exercise Physiologist;Tara Karle Starch, RN, BSN    Medication changes reported      No    Fall or balance concerns reported     No    Tobacco Cessation  No Change    Warm-up and Cool-down  Performed as group-led instruction    Resistance Training Performed  Yes    VAD Patient?  No    PAD/SET Patient?  No      Pain Assessment   Currently in Pain?  No/denies    Multiple Pain Sites  No       Capillary Blood Glucose: No results found for this or any previous visit (from the past 24 hour(s)).  Exercise Prescription Changes - 02/16/18 1200      Response to Exercise   Blood Pressure (Admit)  122/60    Blood Pressure (Exercise)  168/72    Blood Pressure (Exit)  108/71    Heart Rate (Admit)  71 bpm    Heart Rate (Exercise)  140 bpm    Heart Rate (Exit)  77 bpm    Rating of Perceived Exertion (Exercise)  12    Comments  Pt first day of exercise    Duration  Progress to 30 minutes of  aerobic without signs/symptoms of physical distress    Intensity  THRR unchanged      Progression   Progression  Continue to progress workloads to maintain intensity without signs/symptoms of physical distress.    Average METs  5.5      Resistance Training   Training Prescription  Yes    Weight  5 lbs.     Reps  10-15    Time  10 Minutes      Treadmill   MPH  3.8    Grade  2    Minutes  10      Elliptical   Level  2    Speed  2    Minutes  10      Rower   Level  5    Watts  63    Minutes  10       Social History   Tobacco Use  Smoking Status Former Smoker  . Packs/day: 1.50  . Years: 23.00  . Pack years: 34.50  . Types: Cigarettes  . Last attempt to quit: 02/22/1995  . Years since quitting: 23.0  Smokeless Tobacco Never Used    Goals Met:  Exercise tolerated well  Goals Unmet:  Not Applicable  Comments: Pt started cardiac rehab today.  Pt tolerated light exercise without difficulty. VSS, telemetry-SR, asymptomatic.  Medication list reconciled. Pt denies barriers to medicaiton compliance.  PSYCHOSOCIAL ASSESSMENT:  PHQ-0. Pt exhibits positive coping skills, hopeful outlook with supportive family. No psychosocial needs identified at this time, no psychosocial interventions necessary.   Pt oriented to exercise equipment and routine.  Understanding verbalized.   Dr. Fransico Him is Medical Director for Cardiac Rehab at [facility].

## 2018-02-19 ENCOUNTER — Encounter (HOSPITAL_COMMUNITY): Payer: BLUE CROSS/BLUE SHIELD

## 2018-02-19 ENCOUNTER — Encounter (HOSPITAL_COMMUNITY)
Admission: RE | Admit: 2018-02-19 | Discharge: 2018-02-19 | Disposition: A | Discharge status: Home / Self Care | Payer: BLUE CROSS/BLUE SHIELD | Source: Ambulatory Visit | Attending: Cardiovascular Disease | Admitting: Cardiovascular Disease

## 2018-02-19 DIAGNOSIS — I214 Non-ST elevation (NSTEMI) myocardial infarction: Secondary | ICD-10-CM

## 2018-02-19 DIAGNOSIS — Z955 Presence of coronary angioplasty implant and graft: Secondary | ICD-10-CM | POA: Diagnosis not present

## 2018-02-23 ENCOUNTER — Encounter (HOSPITAL_COMMUNITY): Payer: BLUE CROSS/BLUE SHIELD

## 2018-02-23 ENCOUNTER — Encounter (HOSPITAL_COMMUNITY)
Admission: RE | Admit: 2018-02-23 | Discharge: 2018-02-23 | Disposition: A | Discharge status: Home / Self Care | Payer: BLUE CROSS/BLUE SHIELD | Source: Ambulatory Visit | Attending: Cardiovascular Disease | Admitting: Cardiovascular Disease

## 2018-02-23 DIAGNOSIS — I214 Non-ST elevation (NSTEMI) myocardial infarction: Secondary | ICD-10-CM

## 2018-02-23 DIAGNOSIS — Z955 Presence of coronary angioplasty implant and graft: Secondary | ICD-10-CM | POA: Diagnosis not present

## 2018-02-23 DIAGNOSIS — I2510 Atherosclerotic heart disease of native coronary artery without angina pectoris: Secondary | ICD-10-CM | POA: Insufficient documentation

## 2018-02-26 ENCOUNTER — Encounter: Payer: Self-pay | Admitting: Cardiovascular Disease

## 2018-02-26 ENCOUNTER — Encounter (HOSPITAL_COMMUNITY): Payer: BLUE CROSS/BLUE SHIELD

## 2018-02-26 ENCOUNTER — Ambulatory Visit: Payer: BLUE CROSS/BLUE SHIELD | Admitting: Cardiovascular Disease

## 2018-02-26 VITALS — BP 122/80 | HR 65 | Ht 68.5 in | Wt 176.0 lb

## 2018-02-26 DIAGNOSIS — I519 Heart disease, unspecified: Secondary | ICD-10-CM

## 2018-02-26 DIAGNOSIS — E785 Hyperlipidemia, unspecified: Secondary | ICD-10-CM | POA: Insufficient documentation

## 2018-02-26 DIAGNOSIS — I7300 Raynaud's syndrome without gangrene: Secondary | ICD-10-CM | POA: Diagnosis not present

## 2018-02-26 DIAGNOSIS — I2510 Atherosclerotic heart disease of native coronary artery without angina pectoris: Secondary | ICD-10-CM | POA: Diagnosis not present

## 2018-02-26 MED ORDER — CARVEDILOL 3.125 MG PO TABS: 3.1250 mg | ORAL_TABLET | Freq: Two times a day (BID) | ORAL | 3 refills | Status: DC

## 2018-02-26 NOTE — Progress Notes (Signed)
Cardiology Office Note:    Date:  02/26/2018   ID:  Damon [name], DOB 09-08-1960, MRN [id number]  PCP:  Kristian Covey, MD  Cardiologist:  Thurmon Fair, MD  Electrophysiologist:  None   Referring MD: Kristian Covey, MD   Chief Complaint  Patient presents with  . Follow-up    2-3 months.  . Shortness of Breath    Occassionally.    History of Present Illness:    Damon [name] is a 58 y.o. male with a hx of acute non-STEMI leading to placement of a drug-eluting stent in the ramus intermedius artery November 20, 2017, hyperlipidemia, hypertension, Raynaud's syndrome.  He is doing great with cardiac rehab and wants to intensify his exercise regimen.  He has not had any angina pectoris since the stent procedure.  He describes occasional shortness of breath when bending over to tie his shoes, but does not have shortness of breath when exercising in rehab.  He has gained some weight over the holidays.  He is compliant with aspirin, Brilinta and statin.  Over the last few months he has noticed some worsening of the frequency of Raynaud's syndrome in his fingers, he would only when the weather is moderately chilly.  He has not had palpitations or syncope or claudication and denies erectile dysfunction.  Echo at presentation in September 2019 showed LVEF 45-50% with lateral wall motion abnormalities.  Follow-up echo on December 5 shows resolution of the regional abnormalities with an EF of 50-55%.  His PSA has increased is borderline at about 4.5.  His urologist has suggested that he may need a prostate biopsy but at this point is only planning that in May.  He does not have any urinary complaints.  He denies hematuria.  Past Medical History:  Diagnosis Date  . Allergy    seasonal   . Coronary artery disease   . Depression    situational   . Heart murmur    hx of in childhood   . Hyperlipidemia   . Hypertension   . Injury of tendon of long head of right biceps    January 2016     Past Surgical History:  Procedure Laterality Date  . APPENDECTOMY    . CARDIAC CATHETERIZATION    . COLONOSCOPY N/A 11/28/2014   Procedure: COLONOSCOPY;  Surgeon: Louis Meckel, MD;  Location: [facility];  Service: Endoscopy;  Laterality: N/A;  . colonscopy      removed polyps  . CORONARY STENT INTERVENTION N/A 11/20/2017   Procedure: CORONARY STENT INTERVENTION;  Surgeon: Runell Gess, MD;  Location: [facility];  Service: Cardiovascular;  Laterality: N/A;  . INGUINAL HERNIA REPAIR Left 06/12/2014   Procedure: LEFT INGUINAL HERNIA REPAIR WITH MESH;  Surgeon: Darnell Level, MD;  Location: [facility];  Service: General;  Laterality: Left;  . INSERTION OF MESH Left 06/12/2014   Procedure: INSERTION OF MESH;  Surgeon: Darnell Level, MD;  Location: [facility];  Service: General;  Laterality: Left;  . LEFT HEART CATH AND CORONARY ANGIOGRAPHY N/A 11/20/2017   Procedure: LEFT HEART CATH AND CORONARY ANGIOGRAPHY;  Surgeon: Runell Gess, MD;  Location: [facility];  Service: Cardiovascular;  Laterality: N/A;  . MOUTH SURGERY     wisdom teeth removed;root canal - 03/2014  . TONSILLECTOMY  1995  . TONSILLECTOMY      Current Medications: Current Meds  Medication Sig  . aspirin EC 81 MG EC tablet Take 1 tablet (81 mg total) by  mouth daily.  Marland Kitchen atorvastatin (LIPITOR) 80 MG tablet Take 1 tablet (80 mg total) by mouth daily at 6 PM.  . cetirizine (ZYRTEC) 10 MG tablet Take 10 mg by mouth 2 (two) times daily as needed.   . clonazePAM (KLONOPIN) 0.5 MG tablet Take 0.5-1 mg by mouth See admin instructions. Take 2 tablets in the morning and 1 tablet in the evening. May take 1 additional tablet as needed for anxiety  . lisdexamfetamine (VYVANSE) 30 MG capsule Take 30 mg by mouth as needed.   Marland Kitchen lisinopril (PRINIVIL,ZESTRIL) 20 MG tablet Take 1 tablet (20 mg total) by mouth daily.  . nitroGLYCERIN (NITROSTAT) 0.4 MG SL tablet Place 1 tablet (0.4 mg total) under the tongue every 5 (five)  minutes x 3 doses as needed for chest pain.  . ticagrelor (BRILINTA) 90 MG TABS tablet Take 1 tablet (90 mg total) by mouth 2 (two) times daily.  . [DISCONTINUED] metoprolol succinate (TOPROL XL) 25 MG 24 hr tablet Take 1 tablet (25 mg total) by mouth daily.     Allergies:   Poison ivy extract [poison ivy extract] and Poison oak extract [poison oak extract]   Social History   Socioeconomic History  . Marital status: Married    Spouse name: Not on file  . Number of children: Not on file  . Years of education: Not on file  . Highest education level: Professional school degree (e.g., MD, DDS, DVM, JD)  Occupational History  . Not on file  Social Needs  . Financial resource strain: Not hard at all  . Food insecurity:    Worry: Never true    Inability: Never true  . Transportation needs:    Medical: No    Non-medical: No  Tobacco Use  . Smoking status: Former Smoker    Packs/day: 1.50    Years: 23.00    Pack years: 34.50    Types: Cigarettes    Last attempt to quit: 02/22/1995    Years since quitting: 23.0  . Smokeless tobacco: Never Used  Substance and Sexual Activity  . Alcohol use: Yes    Comment: occas  . Drug use: No  . Sexual activity: Not on file  Lifestyle  . Physical activity:    Days per week: 4 days    Minutes per session: 30 min  . Stress: Rather much  Relationships  . Social connections:    Talks on phone: Not on file    Gets together: Not on file    Attends religious service: Not on file    Active member of club or organization: Not on file    Attends meetings of clubs or organizations: Not on file    Relationship status: Not on file  Other Topics Concern  . Not on file  Social History Narrative  . Not on file     Family History: The patient's family history includes AAA (abdominal aortic aneurysm) in his mother; Alzheimer's disease in his mother; Cancer in his paternal grandfather and another family member; Depression in an other family member; Heart  disease in his paternal grandfather and another family member; Hyperlipidemia in an other family member; Hypertension in an other family member; Stroke in an other family member.  ROS:   Please see the history of present illness.     All other systems reviewed and are negative.  EKGs/Labs/Other Studies Reviewed:    The following studies were reviewed today: Coronary angiograms, echo results from January 25, 2018  EKG:  EKG is  not ordered today.    Recent Labs: 12/19/2017: BUN 15; Creatinine, Ser 0.88; Hemoglobin 14.2; Platelets 206.0; Potassium 4.9; Sodium 138; TSH 0.97 01/22/2018: ALT 27  Recent Lipid Panel    Component Value Date/Time   CHOL 175 01/22/2018 0906   TRIG 444 (H) 01/22/2018 0906   HDL 41 01/22/2018 0906   CHOLHDL 4.3 01/22/2018 0906   CHOLHDL 3 12/19/2017 1219   VLDL 21.0 12/19/2017 1219   LDLCALC Comment 01/22/2018 0906   LDLDIRECT 93.0 12/09/2015 0820    Physical Exam:    VS:  BP 122/80 (BP Location: Left Arm, Patient Position: Sitting, Cuff Size: Normal)   Pulse 65   Ht 5' 8.5" (1.74 m)   Wt 176 lb (79.8 kg)   BMI 26.37 kg/m     Wt Readings from Last 3 Encounters:  02/26/18 176 lb (79.8 kg)  02/08/18 178 lb 2.1 oz (80.8 kg)  12/21/17 170 lb (77.1 kg)     GEN:  Well nourished, well developed in no acute distress HEENT: Normal NECK: No JVD; No carotid bruits LYMPHATICS: No lymphadenopathy CARDIAC: RRR, no murmurs, rubs, gallops RESPIRATORY:  Clear to auscultation without rales, wheezing or rhonchi  ABDOMEN: Soft, non-tender, non-distended MUSCULOSKELETAL:  No edema; No deformity  SKIN: Warm and dry NEUROLOGIC:  Alert and oriented x 3 PSYCHIATRIC:  Normal affect   ASSESSMENT:    1. Coronary artery disease involving native coronary artery of native heart without angina pectoris   2. Left ventricular systolic dysfunction without heart failure   3. Dyslipidemia   4. Raynaud's disease without gangrene    PLAN:    In order of problems listed  above:  1. CAD: Asymptomatic since presentation with non-STEMI in September.  Discussed the need to continue dual antiplatelet therapy through September 2020, although starting April 2020 we can consider temporary interruption of antiplatelet therapy throughout procedure such as prostate biopsy.  Continue cardiac rehab/regular exercise.  On statin and beta-blocker. 2. LV dysfunction: Improved, likely resolution of transient stunning, now with borderline LVEF at 50-55%.  He does not have any signs or symptoms of heart failure.  He is on an ACE inhibitor and beta-blocker. 3. HLP: On highly active statin -plan to continue atorvastatin 80 mg daily at least until September 2020.  Despite being minimally overweight and exercising regularly his triglycerides were very high.  Plan to reassess these in the near future.  He wonders whether this was due to excessive triglyceride intake during a trip [place].  If his triglycerides remain elevated I recommend starting Vascepa.  4. Raynaud's syndrome: It is possible that his tendency to vasoconstriction has been worsened by taking a unopposed beta-blocker.  Recommend switching from metoprolol to carvedilol 3.125 mg twice daily.   Medication Adjustments/Labs and Tests Ordered: Current medicines are reviewed at length with the patient today.  Concerns regarding medicines are outlined above.  Orders Placed This Encounter  Procedures  . Lipid panel   Meds ordered this encounter  Medications  . carvedilol (COREG) 3.125 MG tablet    Sig: Take 1 tablet (3.125 mg total) by mouth 2 (two) times daily.    Dispense:  180 tablet    Refill:  3    Patient Instructions  Medication Instructions:  Dr Royann Shivers has recommended making the following medication changes: 1. STOP Metoprolol 2. START Carvedilol 3.125 mg - take 1 tablet twice daily  If you need a refill on your cardiac medications before your next appointment, please call your pharmacy.   Lab  work: Your physician recommends that you return for lab work at [facility] manag[facility] convenience - FASTING.  If you have labs (blood work) drawn today and your tests are completely normal, you will receive your results only by: Marland Kitchen. MyChart Message (if you have MyChart) OR . A paper copy in the mail If you have any lab test that is abnormal or we need to change your treatment, we will call you to review the results.  Follow-Up: At [facility], you and your health needs are our priority.  As part of our continuing mission to provide you with exceptional heart care, we have created designated Provider Care Teams.  These Care Teams include your primary Cardiologist (physician) and Advanced Practice Providers (APPs -  Physician Assistants and Nurse Practitioners) who all work together to provide you with the care you need, when you need it. You will need a follow up appointment in 6 months. You may see Thurmon FairMihai Perl Folmar, MD or one of the following Advanced Practice Providers on your designated Care Team: [place], [place] . Micah FlesherAngela Duke, PA-C . You will receive a reminder letter in the mail two months in advance. If you don't receive a letter, please call our office to schedule the follow-up appointment.    Signed, Thurmon FairMihai Liv Rallis, MD  02/26/2018 11:50 AM    [facility]

## 2018-02-26 NOTE — Patient Instructions (Addendum)
Medication Instructions:  Dr Royann Shivers has recommended making the following medication changes: 1. STOP Metoprolol 2. START Carvedilol 3.125 mg - take 1 tablet twice daily  If you need a refill on your cardiac medications before your next appointment, please call your pharmacy.   Lab work: Your physician recommends that you return for lab work at your convenience - FASTING.  If you have labs (blood work) drawn today and your tests are completely normal, you will receive your results only by: Marland Kitchen MyChart Message (if you have MyChart) OR . A paper copy in the mail If you have any lab test that is abnormal or we need to change your treatment, we will call you to review the results.  Follow-Up: At [facility], you and your health needs are our priority.  As part of our continuing mission to provide you with exceptional heart care, we have created designated Provider Care Teams.  These Care Teams include your primary Cardiologist (physician) and Advanced Practice Providers (APPs -  Physician Assistants and Nurse Practitioners) who all work together to provide you with the care you need, when you need it. You will need a follow up appointment in 6 months. You may see Thurmon Fair, MD or one of the following Advanced Practice Providers on your designated Care Team: [place], [place] . Micah Flesher, PA-C . You will receive a reminder letter in the mail two months in advance. If you don't receive a letter, please call our office to schedule the follow-up appointment.

## 2018-02-27 DIAGNOSIS — F411 Generalized anxiety disorder: Secondary | ICD-10-CM | POA: Diagnosis not present

## 2018-02-28 ENCOUNTER — Encounter (HOSPITAL_COMMUNITY): Payer: BLUE CROSS/BLUE SHIELD

## 2018-02-28 NOTE — Progress Notes (Signed)
Cardiac Individual Treatment Plan  Patient Details  Name: Damon [name] MRN: [id number] Date of Birth: Apr 23, 1960 Referring Provider:   Flowsheet Row CARDIAC REHAB PHASE II ORIENTATION from 02/08/2018 in [place]  Referring Provider  Dr. Sallyanne Kuster      Initial Encounter Date:  Flowsheet Row CARDIAC REHAB PHASE II ORIENTATION from 02/08/2018 in [place]  Date  02/08/18      Visit Diagnosis: NSTEMI (non-ST elevated myocardial infarction) ([place]) 11/20/17  Status post coronary artery stent placement 11/20/17 DES CFX  Patient's Home Medications on Admission:  Current Outpatient Medications:  .  aspirin EC 81 MG EC tablet, Take 1 tablet (81 mg total) by mouth daily., Disp: , Rfl:  .  atorvastatin (LIPITOR) 80 MG tablet, Take 1 tablet (80 mg total) by mouth daily at 6 PM., Disp: 90 tablet, Rfl: 2 .  carvedilol (COREG) 3.125 MG tablet, Take 1 tablet (3.125 mg total) by mouth 2 (two) times daily., Disp: 180 tablet, Rfl: 3 .  cetirizine (ZYRTEC) 10 MG tablet, Take 10 mg by mouth 2 (two) times daily as needed. , Disp: , Rfl:  .  clonazePAM (KLONOPIN) 0.5 MG tablet, Take 0.5-1 mg by mouth See admin instructions. Take 2 tablets in the morning and 1 tablet in the evening. May take 1 additional tablet as needed for anxiety, Disp: , Rfl:  .  lisdexamfetamine (VYVANSE) 30 MG capsule, Take 30 mg by mouth as needed. , Disp: , Rfl:  .  lisinopril (PRINIVIL,ZESTRIL) 20 MG tablet, Take 1 tablet (20 mg total) by mouth daily., Disp: 90 tablet, Rfl: 2 .  nitroGLYCERIN (NITROSTAT) 0.4 MG SL tablet, Place 1 tablet (0.4 mg total) under the tongue every 5 (five) minutes x 3 doses as needed for chest pain., Disp: 25 tablet, Rfl: 1 .  ticagrelor (BRILINTA) 90 MG TABS tablet, Take 1 tablet (90 mg total) by mouth 2 (two) times daily., Disp: 180 tablet, Rfl: 3  Past Medical History: Past Medical History:  Diagnosis Date  . Allergy    seasonal   .  Coronary artery disease   . Depression    situational   . Heart murmur    hx of in childhood   . Hyperlipidemia   . Hypertension   . Injury of tendon of long head of right biceps    January 2016    Tobacco Use: Social History   Tobacco Use  Smoking Status Former Smoker  . Packs/day: 1.50  . Years: 23.00  . Pack years: 34.50  . Types: Cigarettes  . Last attempt to quit: 02/22/1995  . Years since quitting: 23.0  Smokeless Tobacco Never Used    Labs: Recent Chemical engineer    Labs for ITP Cardiac and Pulmonary Rehab Latest Ref Rng & Units 12/09/2015 12/28/2016 11/18/2017 12/19/2017 01/22/2018   Cholestrol 100 - 199 mg/dL 177 148 180 154 175   LDLCALC 0 - 99 mg/dL - 85 99 82 Comment   LDLDIRECT mg/dL 93.0 - - - -   HDL >39 mg/dL 39.40 38.20(L) 48 51.40 41   Trlycerides 0 - 149 mg/dL 365.0(H) 124.0 165(H) 105.0 444(H)      Capillary Blood Glucose: No results found for: GLUCAP   Exercise Target Goals: Exercise Program Goal: Individual exercise prescription set using results from initial 6 min walk test and THRR while considering  patient's activity barriers and safety.   Exercise Prescription Goal: Initial exercise prescription builds to 30-45 minutes a day of  aerobic activity, 2-3 days per week.  Home exercise guidelines will be given to patient during program as part of exercise prescription that the participant will acknowledge.  Activity Barriers & Risk Stratification: Activity Barriers & Cardiac Risk Stratification - 02/08/18 1154    Activity Barriers & Cardiac Risk Stratification          Activity Barriers  None    Cardiac Risk Stratification  High           6 Minute Walk: 6 Minute Walk    6 Minute Walk    Row Name 02/08/18 1153   Phase  Initial   Distance  2032 feet   Walk Time  6 minutes   # of Rest Breaks  0   MPH  3.8   METS  4.9   RPE  9   VO2 Peak  17.07   Symptoms  No   Resting HR  70 bpm   Resting BP  104/74   Resting Oxygen Saturation    97 %   Exercise Oxygen Saturation  during 6 min walk  99 %   Max Ex. HR  108 bpm   Max Ex. BP  122/78   2 Minute Post BP  108/72          Oxygen Initial Assessment:   Oxygen Re-Evaluation:   Oxygen Discharge (Final Oxygen Re-Evaluation):   Initial Exercise Prescription: Initial Exercise Prescription - 02/08/18 1100    Date of Initial Exercise RX and Referring Provider          Date  02/08/18    Referring Provider  Dr. Sallyanne Kuster    Expected Discharge Date  05/18/18        Treadmill          MPH  3.8    Grade  2    Minutes  10        Elliptical          Level  2    Speed  2    Minutes  10        Rower          Level  4    Watts  60    Minutes  10        Prescription Details          Frequency (times per week)  3    Duration  Progress to 30 minutes of continuous aerobic without signs/symptoms of physical distress        Intensity          THRR 40-80% of Max Heartrate  65-130    Ratings of Perceived Exertion  11-13        Progression          Progression  Continue to progress workloads to maintain intensity without signs/symptoms of physical distress.        Resistance Training          Training Prescription  Yes    Weight  5 lbs.     Reps  10-15           Perform Capillary Blood Glucose checks as needed.  Exercise Prescription Changes: Exercise Prescription Changes    Response to Exercise    Row Name 02/16/18 1200 02/22/18 1400   Blood Pressure (Admit)  122/60  114/72   Blood Pressure (Exercise)  168/72  152/64   Blood Pressure (Exit)  108/71  112/75   Heart Rate (Admit)  71 bpm  86 bpm  Heart Rate (Exercise)  140 bpm  151 bpm   Heart Rate (Exit)  77 bpm  88 bpm   Rating of Perceived Exertion (Exercise)  12  11   Comments  Pt first day of exercise  no documentation   Duration  Progress to 30 minutes of  aerobic without signs/symptoms of physical distress  Progress to 30 minutes of  aerobic without signs/symptoms of physical  distress   Intensity  THRR unchanged  THRR unchanged       Progression    Row Name 02/16/18 1200 02/22/18 1400   Progression  Continue to progress workloads to maintain intensity without signs/symptoms of physical distress.  Continue to progress workloads to maintain intensity without signs/symptoms of physical distress.   Average METs  5.5  6.9       Resistance Training    Row Name 02/16/18 1200 02/22/18 1400   Training Prescription  Yes  Yes   Weight  5 lbs.   6 lbs.    Reps  10-15  10-15   Time  10 Minutes  [place] Name 02/16/18 1200 02/22/18 1400   MPH  3.8  4   Grade  2  3   Minutes  10  10       Elliptical    Row Name 02/16/18 1200 02/22/18 1400   Level  2  2   Speed  2  2   Minutes  10  10       Rower    Row Name 02/16/18 1200 02/22/18 1400   Level  5  4   Watts  63  72   Minutes  10  10          Exercise Comments: Exercise Comments    Row Name 02/16/18 1225 02/22/18 1443   Exercise Comments  Pt first day of exercise. Tolerated well.   Reviewed METs and goals with Pt. Continues exercising at [employer] [employer].       Exercise Goals and Review: Exercise Goals    Exercise Goals    Row Name 02/08/18 1156   Increase Physical Activity  Yes   Intervention  Provide advice, education, support and counseling about physical activity/exercise needs.;Develop an individualized exercise prescription for aerobic and resistive training based on initial evaluation findings, risk stratification, comorbidities and participant's personal goals.   Expected Outcomes  Short Term: Attend rehab on a regular basis to increase amount of physical activity.;Long Term: Add in home exercise to make exercise part of routine and to increase amount of physical activity.;Long Term: Exercising regularly at least 3-5 days a week.   Increase Strength and Stamina  Yes   Intervention  Provide advice, education, support and counseling about physical activity/exercise  needs.;Develop an individualized exercise prescription for aerobic and resistive training based on initial evaluation findings, risk stratification, comorbidities and participant's personal goals.   Expected Outcomes  Short Term: Increase workloads from initial exercise prescription for resistance, speed, and METs.;Short Term: Perform resistance training exercises routinely during rehab and add in resistance training at home;Long Term: Improve cardiorespiratory fitness, muscular endurance and strength as measured by increased METs and functional capacity (6MWT)   Able to understand and use rate of perceived exertion (RPE) scale  Yes   Intervention  Provide education and explanation on how to use RPE scale   Expected Outcomes  Short Term: Able to use RPE daily in rehab to express subjective intensity level;Long Term:  Able to use RPE to guide intensity level when exercising independently   Knowledge and understanding of Target Heart Rate Range (THRR)  Yes   Intervention  Provide education and explanation of THRR including how the numbers were predicted and where they are located for reference   Expected Outcomes  Short Term: Able to state/look up THRR;Long Term: Able to use THRR to govern intensity when exercising independently;Short Term: Able to use daily as guideline for intensity in rehab   Able to check pulse independently  Yes   Intervention  Provide education and demonstration on how to check pulse in carotid and radial arteries.;Review the importance of being able to check your own pulse for safety during independent exercise   Expected Outcomes  Short Term: Able to explain why pulse checking is important during independent exercise;Long Term: Able to check pulse independently and accurately   Understanding of Exercise Prescription  Yes   Intervention  Provide education, explanation, and written materials on patient's individual exercise prescription   Expected Outcomes  Short Term: Able to  explain program exercise prescription;Long Term: Able to explain home exercise prescription to exercise independently          Exercise Goals Re-Evaluation : Exercise Goals Re-Evaluation    Exercise Goal Re-Evaluation    Row Name 02/16/18 1224 02/22/18 1442   Exercise Goals Review  Increase Physical Activity;Increase Strength and Stamina;Able to check pulse independently;Understanding of Exercise Prescription;Knowledge and understanding of Target Heart Rate Range (THRR);Able to understand and use rate of perceived exertion (RPE) scale  Increase Physical Activity;Increase Strength and Stamina;Able to check pulse independently;Understanding of Exercise Prescription;Knowledge and understanding of Target Heart Rate Range (THRR);Able to understand and use rate of perceived exertion (RPE) scale   Comments  Pt first day of exercise. Pt tolerated exercise Rx well. Will increase workloads as tolerated.   Reviewed METs and goals with Pt. MET level is 6.9 and Pt is progressing well. Continues at [facility] in addition to cardiac rehab.    Expected Outcomes  Will monitor and progress Pt as tolerated.   Will monitor and progress Pt as tolerated.           Discharge Exercise Prescription (Final Exercise Prescription Changes): Exercise Prescription Changes - 02/22/18 1400    Response to Exercise          Blood Pressure (Admit)  114/72    Blood Pressure (Exercise)  152/64    Blood Pressure (Exit)  112/75    Heart Rate (Admit)  86 bpm    Heart Rate (Exercise)  151 bpm    Heart Rate (Exit)  88 bpm    Rating of Perceived Exertion (Exercise)  11    Duration  Progress to 30 minutes of  aerobic without signs/symptoms of physical distress    Intensity  THRR unchanged        Progression          Progression  Continue to progress workloads to maintain intensity without signs/symptoms of physical distress.    Average METs  6.9        Resistance Training          Training Prescription  Yes    Weight  6  lbs.     Reps  10-15    Time  10 Minutes        Treadmill          MPH  4    Grade  3    Minutes  10  Elliptical          Level  2    Speed  2    Minutes  10        Rower          Level  4    Watts  72    Minutes  10           Nutrition:  Target Goals: Understanding of nutrition guidelines, daily intake of sodium <1559m, cholesterol <2076m calories 30% from fat and 7% or less from saturated fats, daily to have 5 or more servings of fruits and vegetables.  Biometrics: Pre Biometrics - 02/08/18 1157    Pre Biometrics          Height  5' 8"  (1.727 m)    Weight  80.8 kg    Waist Circumference  38.5 inches    Hip Circumference  41 inches    Waist to Hip Ratio  0.94 %    BMI (Calculated)  27.09    Triceps Skinfold  19 mm    % Body Fat  27.3 %    Grip Strength  39 kg    Flexibility  17.5 in    Single Leg Stand  30 seconds            Nutrition Therapy Plan and Nutrition Goals: Nutrition Therapy & Goals - 02/08/18 1605    Nutrition Therapy          Diet  heart healthy        Personal Nutrition Goals          Nutrition Goal  Pt to identify and limit food sources of saturated fat, trans fat, refined carbohydrates and sodium    Personal Goal #2  Pt to eat regularly across the day    Personal Goal #3  Pt to eat more of and a variety of non-starchy vegetables.        Intervention Plan          Intervention  Prescribe, educate and counsel regarding individualized specific dietary modifications aiming towards targeted core components such as weight, hypertension, lipid management, diabetes, heart failure and other comorbidities.    Expected Outcomes  Short Term Goal: Understand basic principles of dietary content, such as calories, fat, sodium, cholesterol and nutrients.;Long Term Goal: Adherence to prescribed nutrition plan.           Nutrition Assessments: Nutrition Assessments - 02/08/18 1610    MEDFICTS Scores          Pre Score  21            Nutrition Goals Re-Evaluation:   Nutrition Goals Re-Evaluation:   Nutrition Goals Discharge (Final Nutrition Goals Re-Evaluation):   Psychosocial: Target Goals: Acknowledge presence or absence of significant depression and/or stress, maximize coping skills, provide positive support system. Participant is able to verbalize types and ability to use techniques and skills needed for reducing stress and depression.  Initial Review & Psychosocial Screening: Initial Psych Review & Screening - 02/08/18 1221    Initial Review          Current issues with  Current Stress Concerns    Source of Stress Concerns  Family;Occupation    Comments  Bennie's mother has Alzheimer's. 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  Yes  Barriers          Psychosocial barriers to participate in program  The patient should benefit from training in stress management and relaxation.        Screening Interventions          Interventions  Encouraged to exercise    Expected Outcomes  Short Term goal: Utilizing psychosocial counselor, staff and physician to assist with identification of specific Stressors or current issues interfering with healing process. Setting desired goal for each stressor or current issue identified.;Long Term Goal: Stressors or current issues are controlled or eliminated.           Quality of Life Scores: Quality of Life - 02/08/18 1222    Quality of Life          Select  Quality of Life        Quality of Life Scores          Health/Function Pre  26.3 %    Socioeconomic Pre  27.43 %    Psych/Spiritual Pre  28.07 %    Family Pre  26.4 %    GLOBAL Pre  26.91 %          Scores of 19 and below usually indicate a poorer quality of life in these areas.  A difference of  2-3 points is a clinically meaningful difference.  A difference of 2-3 points in the total score of the  Quality of Life Index has been associated with significant improvement in overall quality of life, self-image, physical symptoms, and general health in studies assessing change in quality of life.  PHQ-9: Recent Review Flowsheet Data    Depression screen [place] 2/9 02/16/2018 03/11/2016 04/23/2015 07/16/2014   Decreased Interest 0 0 0 0   Down, Depressed, Hopeless 0 0 0 0   PHQ - 2 Score 0 0 0 0     Interpretation of Total Score  Total Score Depression Severity:  1-4 = Minimal depression, 5-9 = Mild depression, 10-14 = Moderate depression, 15-19 = Moderately severe depression, 20-27 = Severe depression   Psychosocial Evaluation and Intervention: Psychosocial Evaluation - 02/16/18 1425    Psychosocial Evaluation & Interventions          Interventions  Stress management education;Relaxation education;Encouraged to exercise with the program and follow exercise prescription    Comments  No psycosocial interventions necessary.    Expected Outcomes  Faraz will report ability to manage his familial stressors and maintain a positive outook.     Continue Psychosocial Services   No Follow up required           Psychosocial Re-Evaluation: Psychosocial Re-Evaluation    Psychosocial Re-Evaluation    [place] Name 02/28/18 1609   Current issues with  Current Stress Concerns   Comments  pt with work related and family stress, otherwise no psychosocial needs identified, no interventions necessary    Expected Outcomes  pt will exhibit positive outlook with good coping skills.    Interventions  Stress management education;Relaxation education;Encouraged to attend Cardiac Rehabilitation for the exercise   Continue Psychosocial Services   Follow up required by staff   Comments  Jadrien's mother has Alzheimer's. 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       Initial Review    Row Name 02/28/18 1609   Source of Stress Concerns  Family          Psychosocial Discharge  (Final Psychosocial Re-Evaluation): Psychosocial Re-Evaluation -  02/28/18 1609    Psychosocial Re-Evaluation          Current issues with  Current Stress Concerns    Comments  pt with work related and family stress, otherwise no psychosocial needs identified, no interventions necessary     Expected Outcomes  pt will exhibit positive outlook with good coping skills.     Interventions  Stress management education;Relaxation education;Encouraged to attend Cardiac Rehabilitation for the exercise    Continue Psychosocial Services   Follow up required by staff    Comments  Marcy's mother has Alzheimer's. 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        Initial Review          Source of Stress Concerns  Family           Vocational Rehabilitation: Provide vocational rehab assistance to qualifying candidates.   Vocational Rehab Evaluation & Intervention: Vocational Rehab - 02/08/18 1223    Initial Vocational Rehab Evaluation & Intervention          Assessment shows need for Vocational Rehabilitation  No   Jefferey does not need vocational rehab at this time Simultaneous filing. User may not have seen previous data.          Education: Education Goals: Education classes will be provided on a weekly basis, covering required topics. Participant will state understanding/return demonstration of topics presented.  Learning Barriers/Preferences: Learning Barriers/Preferences - 02/08/18 1158    Learning Barriers/Preferences          Learning Barriers  Sight    Learning Preferences  Audio;Computer/Internet;Group Instruction;Individual Instruction;Pictoral;Skilled Demonstration;Verbal Instruction;Video;Written Material           Education Topics: Count Your Pulse:  -Group instruction provided by verbal instruction, demonstration, patient participation and written materials to support subject.  Instructors address importance of being able to find your  pulse and how to count your pulse when at home without a heart monitor.  Patients get hands on experience counting their pulse with staff help and individually.   Heart Attack, Angina, and Risk Factor Modification:  -Group instruction provided by verbal instruction, video, and written materials to support subject.  Instructors address signs and symptoms of angina and heart attacks.    Also discuss risk factors for heart disease and how to make changes to improve heart health risk factors.   Functional Fitness:  -Group instruction provided by verbal instruction, demonstration, patient participation, and written materials to support subject.  Instructors address safety measures for doing things around the house.  Discuss how to get up and down off the floor, how to pick things up properly, how to safely get out of a chair without assistance, and balance training.   Meditation and Mindfulness:  -Group instruction provided by verbal instruction, patient participation, and written materials to support subject.  Instructor addresses importance of mindfulness and meditation practice to help reduce stress and improve awareness.  Instructor also leads participants through a meditation exercise.    Stretching for Flexibility and Mobility:  -Group instruction provided by verbal instruction, patient participation, and written materials to support subject.  Instructors lead participants through series of stretches that are designed to increase flexibility thus improving mobility.  These stretches are additional exercise for major muscle groups that are typically performed during regular warm up and cool down.   Hands Only CPR:  -Group verbal, video, and participation provides a basic overview of AHA guidelines for community CPR. Role-play of  emergencies allow participants the opportunity to practice calling for help and chest compression technique with discussion of AED use.   Hypertension: -Group verbal  and written instruction that provides a basic overview of hypertension including the most recent diagnostic guidelines, risk factor reduction with self-care instructions and medication management.    Nutrition I class: Heart Healthy Eating:  -Group instruction provided by PowerPoint slides, verbal discussion, and written materials to support subject matter. The instructor gives an explanation and review of the Therapeutic Lifestyle Changes diet recommendations, which includes a discussion on lipid goals, dietary fat, sodium, fiber, plant stanol/sterol esters, sugar, and the components of a well-balanced, healthy diet.   Nutrition II class: Lifestyle Skills:  -Group instruction provided by PowerPoint slides, verbal discussion, and written materials to support subject matter. The instructor gives an explanation and review of label reading, grocery shopping for heart health, heart healthy recipe modifications, and ways to make healthier choices when eating out.   Diabetes Question & Answer:  -Group instruction provided by PowerPoint slides, verbal discussion, and written materials to support subject matter. The instructor gives an explanation and review of diabetes co-morbidities, pre- and post-prandial blood glucose goals, pre-exercise blood glucose goals, signs, symptoms, and treatment of hypoglycemia and hyperglycemia, and foot care basics.   Diabetes Blitz:  -Group instruction provided by PowerPoint slides, verbal discussion, and written materials to support subject matter. The instructor gives an explanation and review of the physiology behind type 1 and type 2 diabetes, diabetes medications and rational behind using different medications, pre- and post-prandial blood glucose recommendations and Hemoglobin A1c goals, diabetes diet, and exercise including blood glucose guidelines for exercising safely.    Portion Distortion:  -Group instruction provided by PowerPoint slides, verbal discussion,  written materials, and food models to support subject matter. The instructor gives an explanation of serving size versus portion size, changes in portions sizes over the last 20 years, and what consists of a serving from each food group.   Stress Management:  -Group instruction provided by verbal instruction, video, and written materials to support subject matter.  Instructors review role of stress in heart disease and how to cope with stress positively.     Exercising on Your Own:  -Group instruction provided by verbal instruction, power point, and written materials to support subject.  Instructors discuss benefits of exercise, components of exercise, frequency and intensity of exercise, and end points for exercise.  Also discuss use of nitroglycerin and activating EMS.  Review options of places to exercise outside of rehab.  Review guidelines for sex with heart disease.   Cardiac Drugs I:  -Group instruction provided by verbal instruction and written materials to support subject.  Instructor reviews cardiac drug classes: antiplatelets, anticoagulants, beta blockers, and statins.  Instructor discusses reasons, side effects, and lifestyle considerations for each drug class.   Cardiac Drugs II:  -Group instruction provided by verbal instruction and written materials to support subject.  Instructor reviews cardiac drug classes: angiotensin converting enzyme inhibitors (ACE-I), angiotensin II receptor blockers (ARBs), nitrates, and calcium channel blockers.  Instructor discusses reasons, side effects, and lifestyle considerations for each drug class.   Anatomy and Physiology of the Circulatory System:  Group verbal and written instruction and models provide basic cardiac anatomy and physiology, with the coronary electrical and arterial systems. Review of: AMI, Angina, Valve disease, Heart Failure, Peripheral Artery Disease, Cardiac Arrhythmia, Pacemakers, and the ICD.   Other Education:  -Group  or individual verbal, written, or video instructions that support the  educational goals of the cardiac rehab program.   Holiday Eating Survival Tips:  -Group instruction provided by PowerPoint slides, verbal discussion, and written materials to support subject matter. The instructor gives patients tips, tricks, and techniques to help them not only survive but enjoy the holidays despite the onslaught of food that accompanies the holidays.   Knowledge Questionnaire Score: Knowledge Questionnaire Score - 02/08/18 1201    Knowledge Questionnaire Score          Pre Score  16/24           Core Components/Risk Factors/Patient Goals at Admission: Personal Goals and Risk Factors at Admission - 02/08/18 1158    Core Components/Risk Factors/Patient Goals on Admission           Weight Management  Yes;Weight Maintenance;Weight Loss    Intervention  Weight Management: Develop a combined nutrition and exercise program designed to reach desired caloric intake, while maintaining appropriate intake of nutrient and fiber, sodium and fats, and appropriate energy expenditure required for the weight goal.;Weight Management: Provide education and appropriate resources to help participant work on and attain dietary goals.;Weight Management/Obesity: Establish reasonable short term and long term weight goals.    Admit Weight  178 lb 2.1 oz (80.8 kg)    Expected Outcomes  Short Term: Continue to assess and modify interventions until short term weight is achieved;Long Term: Adherence to nutrition and physical activity/exercise program aimed toward attainment of established weight goal;Weight Maintenance: Understanding of the daily nutrition guidelines, which includes 25-35% calories from fat, 7% or less cal from saturated fats, less than 226m cholesterol, less than 1.5gm of sodium, & 5 or more servings of fruits and vegetables daily;Weight Loss: Understanding of general recommendations for a balanced deficit meal plan,  which promotes 1-2 lb weight loss per week and includes a negative energy balance of [phone] kcal/d;Understanding recommendations for meals to include 15-35% energy as protein, 25-35% energy from fat, 35-60% energy from carbohydrates, less than 2031mof dietary cholesterol, 20-35 gm of total fiber daily;Understanding of distribution of calorie intake throughout the day with the consumption of 4-5 meals/snacks    Hypertension  Yes    Intervention  Provide education on lifestyle modifcations including regular physical activity/exercise, weight management, moderate sodium restriction and increased consumption of fresh fruit, vegetables, and low fat dairy, alcohol moderation, and smoking cessation.;Monitor prescription use compliance.    Expected Outcomes  Short Term: Continued assessment and intervention until BP is < 140/9028mG in hypertensive participants. < 130/18m28m in hypertensive participants with diabetes, heart failure or chronic kidney disease.;Long Term: Maintenance of blood pressure at goal levels.    Lipids  Yes    Intervention  Provide education and support for participant on nutrition & aerobic/resistive exercise along with prescribed medications to achieve LDL <70mg33mL >40mg.81mExpected Outcomes  Short Term: Participant states understanding of desired cholesterol values and is compliant with medications prescribed. Participant is following exercise prescription and nutrition guidelines.;Long Term: Cholesterol controlled with medications as prescribed, with individualized exercise RX and with personalized nutrition plan. Value goals: LDL < 70mg, 66m> 40 mg.    Stress  Yes    Intervention  Offer individual and/or small group education and counseling on adjustment to heart disease, stress management and health-related lifestyle change. Teach and support self-help strategies.;Refer participants experiencing significant psychosocial distress to appropriate mental health specialists for further  evaluation and treatment. When possible, include family members and significant others in education/counseling sessions.    Expected Outcomes  Short Term: Participant demonstrates changes in health-related behavior, relaxation and other stress management skills, ability to obtain effective social support, and compliance with psychotropic medications if prescribed.;Long Term: Emotional wellbeing is indicated by absence of clinically significant psychosocial distress or social isolation.           Core Components/Risk Factors/Patient Goals Review:  Goals and Risk Factor Review    Core Components/Risk Factors/Patient Goals Review    Row Name 02/16/18 1438   Personal Goals Review  Weight Management/Obesity;Lipids;Hypertension;Stress   Review  Pt with multiple CAD RFs willing to participate in CR exercise.  Dorthy would like to lose weight and keep his heart strong.    Expected Outcomes  Pt will continue to participate in CR exercise, nutrition, and lifestyle modification.           Core Components/Risk Factors/Patient Goals at Discharge (Final Review):  Goals and Risk Factor Review - 02/16/18 1438    Core Components/Risk Factors/Patient Goals Review          Personal Goals Review  Weight Management/Obesity;Lipids;Hypertension;Stress    Review  Pt with multiple CAD RFs willing to participate in CR exercise.  [employer] would like to lose weight and keep his heart strong.     Expected Outcomes  Pt will continue to participate in CR exercise, nutrition, and lifestyle modification.            ITP Comments: ITP Comments    Row Name 02/08/18 1153 02/16/18 1420   ITP Comments  Dr. Fransico Him, Medical Director  30 Day ITP Review.  Pt started exercise today and tolerated it well.       Comments:

## 2018-03-02 ENCOUNTER — Encounter (HOSPITAL_COMMUNITY): Payer: BLUE CROSS/BLUE SHIELD

## 2018-03-05 ENCOUNTER — Encounter (HOSPITAL_COMMUNITY): Payer: BLUE CROSS/BLUE SHIELD

## 2018-03-05 ENCOUNTER — Encounter (HOSPITAL_COMMUNITY)
Admission: RE | Admit: 2018-03-05 | Discharge: 2018-03-05 | Disposition: A | Discharge status: Home / Self Care | Payer: BLUE CROSS/BLUE SHIELD | Source: Ambulatory Visit | Attending: Cardiovascular Disease | Admitting: Cardiovascular Disease

## 2018-03-05 DIAGNOSIS — I214 Non-ST elevation (NSTEMI) myocardial infarction: Secondary | ICD-10-CM | POA: Diagnosis not present

## 2018-03-05 DIAGNOSIS — Z955 Presence of coronary angioplasty implant and graft: Secondary | ICD-10-CM | POA: Diagnosis not present

## 2018-03-07 ENCOUNTER — Encounter (HOSPITAL_COMMUNITY): Payer: BLUE CROSS/BLUE SHIELD

## 2018-03-09 ENCOUNTER — Encounter (HOSPITAL_COMMUNITY)
Admission: RE | Admit: 2018-03-09 | Discharge: 2018-03-09 | Disposition: A | Discharge status: Home / Self Care | Payer: BLUE CROSS/BLUE SHIELD | Source: Ambulatory Visit | Attending: Cardiovascular Disease | Admitting: Cardiovascular Disease

## 2018-03-09 ENCOUNTER — Encounter (HOSPITAL_COMMUNITY): Payer: BLUE CROSS/BLUE SHIELD

## 2018-03-09 DIAGNOSIS — Z955 Presence of coronary angioplasty implant and graft: Secondary | ICD-10-CM | POA: Diagnosis not present

## 2018-03-09 DIAGNOSIS — I214 Non-ST elevation (NSTEMI) myocardial infarction: Secondary | ICD-10-CM

## 2018-03-12 ENCOUNTER — Encounter (HOSPITAL_COMMUNITY): Payer: BLUE CROSS/BLUE SHIELD

## 2018-03-14 ENCOUNTER — Encounter (HOSPITAL_COMMUNITY): Payer: BLUE CROSS/BLUE SHIELD

## 2018-03-14 ENCOUNTER — Encounter (HOSPITAL_COMMUNITY)
Admission: RE | Admit: 2018-03-14 | Discharge: 2018-03-14 | Disposition: A | Discharge status: Home / Self Care | Payer: BLUE CROSS/BLUE SHIELD | Source: Ambulatory Visit | Attending: Cardiovascular Disease | Admitting: Cardiovascular Disease

## 2018-03-14 DIAGNOSIS — I214 Non-ST elevation (NSTEMI) myocardial infarction: Secondary | ICD-10-CM

## 2018-03-14 DIAGNOSIS — Z955 Presence of coronary angioplasty implant and graft: Secondary | ICD-10-CM | POA: Diagnosis not present

## 2018-03-15 ENCOUNTER — Telehealth: Payer: Self-pay | Admitting: Cardiovascular Disease

## 2018-03-15 NOTE — Telephone Encounter (Signed)
New message     Pt c/o medication issue:  1. Name of Medication: carvedilol (COREG) 3.125 MG tablet   2. How are you currently taking this medication (dosage and times per day)? Take 1 tablet (3.125 mg total) by mouth 2 (two) times daily.  3. Are you having a reaction (difficulty breathing--STAT)? No   4. What is your medication issue? Pt stated that since he has been on medication he is very very tired and doesn't feel like its working and wants to know what he should do. Please follow up

## 2018-03-16 ENCOUNTER — Encounter (HOSPITAL_COMMUNITY)
Admission: RE | Admit: 2018-03-16 | Discharge: 2018-03-16 | Disposition: A | Discharge status: Home / Self Care | Payer: BLUE CROSS/BLUE SHIELD | Source: Ambulatory Visit | Attending: Cardiovascular Disease | Admitting: Cardiovascular Disease

## 2018-03-16 ENCOUNTER — Encounter (HOSPITAL_COMMUNITY): Payer: BLUE CROSS/BLUE SHIELD

## 2018-03-16 DIAGNOSIS — Z955 Presence of coronary angioplasty implant and graft: Secondary | ICD-10-CM | POA: Diagnosis not present

## 2018-03-16 DIAGNOSIS — I214 Non-ST elevation (NSTEMI) myocardial infarction: Secondary | ICD-10-CM

## 2018-03-16 DIAGNOSIS — F4320 Adjustment disorder, unspecified: Secondary | ICD-10-CM | POA: Diagnosis not present

## 2018-03-16 NOTE — Telephone Encounter (Signed)
Spoke with patient.  He admits has only given the carvedilol about 4-5 days.  Feels like he's done for the day around 4-5 pm.  And has not noticed any changes in his fingers.  They still look like he's "dead".  On the other hand, he has been told by cardiac rehab that his BP and HR are doing much better.  He will continue with the carvedilol for another week or two, and if he still has the fatigue he will call and we can switch him back to metoprolol.

## 2018-03-19 ENCOUNTER — Encounter (HOSPITAL_COMMUNITY): Payer: BLUE CROSS/BLUE SHIELD

## 2018-03-19 ENCOUNTER — Encounter (HOSPITAL_COMMUNITY)
Admission: RE | Admit: 2018-03-19 | Discharge: 2018-03-19 | Disposition: A | Discharge status: Home / Self Care | Payer: BLUE CROSS/BLUE SHIELD | Source: Ambulatory Visit | Attending: Cardiovascular Disease | Admitting: Cardiovascular Disease

## 2018-03-19 DIAGNOSIS — Z955 Presence of coronary angioplasty implant and graft: Secondary | ICD-10-CM

## 2018-03-19 DIAGNOSIS — I214 Non-ST elevation (NSTEMI) myocardial infarction: Secondary | ICD-10-CM

## 2018-03-21 ENCOUNTER — Encounter (HOSPITAL_COMMUNITY): Payer: BLUE CROSS/BLUE SHIELD

## 2018-03-21 ENCOUNTER — Encounter (HOSPITAL_COMMUNITY)
Admission: RE | Admit: 2018-03-21 | Discharge: 2018-03-21 | Disposition: A | Discharge status: Home / Self Care | Payer: BLUE CROSS/BLUE SHIELD | Source: Ambulatory Visit | Attending: Cardiovascular Disease | Admitting: Cardiovascular Disease

## 2018-03-21 DIAGNOSIS — Z955 Presence of coronary angioplasty implant and graft: Secondary | ICD-10-CM | POA: Diagnosis not present

## 2018-03-21 DIAGNOSIS — I214 Non-ST elevation (NSTEMI) myocardial infarction: Secondary | ICD-10-CM | POA: Diagnosis not present

## 2018-03-23 ENCOUNTER — Encounter (HOSPITAL_COMMUNITY): Payer: BLUE CROSS/BLUE SHIELD

## 2018-03-23 ENCOUNTER — Encounter (HOSPITAL_COMMUNITY)
Admission: RE | Admit: 2018-03-23 | Discharge: 2018-03-23 | Disposition: A | Discharge status: Home / Self Care | Payer: BLUE CROSS/BLUE SHIELD | Source: Ambulatory Visit | Attending: Cardiovascular Disease | Admitting: Cardiovascular Disease

## 2018-03-23 DIAGNOSIS — I214 Non-ST elevation (NSTEMI) myocardial infarction: Secondary | ICD-10-CM | POA: Diagnosis not present

## 2018-03-23 DIAGNOSIS — Z955 Presence of coronary angioplasty implant and graft: Secondary | ICD-10-CM

## 2018-03-23 NOTE — Progress Notes (Signed)
Damon [name] 58 y.o. male DOB: 11/04/1960 MRN: [id number]      Nutrition Note  1. NSTEMI (non-ST elevated myocardial infarction) (HCC) 11/20/17   2. Status post coronary artery stent placement 11/20/17 DES CFX    Past Medical History:  Diagnosis Date  . Allergy    seasonal   . Coronary artery disease   . Depression    situational   . Heart murmur    hx of in childhood   . Hyperlipidemia   . Hypertension   . Injury of tendon of long head of right biceps    January 2016   Meds reviewed.    Current Outpatient Medications (Cardiovascular):  .  atorvastatin (LIPITOR) 80 MG tablet, Take 1 tablet (80 mg total) by mouth daily at 6 PM. .  lisinopril (PRINIVIL,ZESTRIL) 20 MG tablet, Take 1 tablet (20 mg total) by mouth daily. .  metoprolol succinate (TOPROL XL) 25 MG 24 hr tablet, Take 1 tablet (25 mg total) by mouth daily. .  nitroGLYCERIN (NITROSTAT) 0.4 MG SL tablet, Place 1 tablet (0.4 mg total) under the tongue every 5 (five) minutes x 3 doses as needed for chest pain.  Current Outpatient Medications (Respiratory):  .  cetirizine (ZYRTEC) 10 MG tablet, Take 10 mg by mouth 2 (two) times daily as needed.   Current Outpatient Medications (Analgesics):  .  aspirin EC 81 MG EC tablet, Take 1 tablet (81 mg total) by mouth daily.  Current Outpatient Medications (Hematological):  .  ticagrelor (BRILINTA) 90 MG TABS tablet, Take 1 tablet (90 mg total) by mouth 2 (two) times daily.  Current Outpatient Medications (Other):  .  clonazePAM (KLONOPIN) 0.5 MG tablet, Take 0.5-1 mg by mouth See admin instructions. Take 2 tablets in the morning and 1 tablet in the evening. May take 1 additional tablet as needed for anxiety .  lisdexamfetamine (VYVANSE) 30 MG capsule, Take 30 mg by mouth as needed.    HT: Ht Readings from Last 1 Encounters:  02/08/18 5\' 8"  (1.727 m)    WT: Wt Readings from Last 5 Encounters:  02/08/18 178 lb 2.1 oz (80.8 kg)  12/21/17 170 lb (77.1 kg)  12/19/17 176 lb 4.8  oz (80 kg)  12/05/17 175 lb (79.4 kg)  11/20/17 171 lb 3.2 oz (77.7 kg)     Body mass index is 27.08 kg/m.   Current tobacco use? No  Labs:  Lipid Panel     Component Value Date/Time   CHOL 175 01/22/2018 0906   TRIG 444 (H) 01/22/2018 0906   HDL 41 01/22/2018 0906   CHOLHDL 4.3 01/22/2018 0906   CHOLHDL 3 12/19/2017 1219   VLDL 21.0 12/19/2017 1219   LDLCALC Comment 01/22/2018 0906   LDLDIRECT 93.0 12/09/2015 0820    No results found for: HGBA1C CBG (last 3)  No results for input(s): GLUCAP in the last 72 hours.  Nutrition Note Spoke with pt. Nutrition plan and goals reviewed with pt. Pt is following Step 2 of the Therapeutic Lifestyle Changes diet. Pt wants to continue to eat heart healthy. Heart healthy eating tips reviewed (label reading, how to build a healthy plate, portion sizes, eating frequently across the day). Pt last triglycerides were elevated, discussed the differences between complex and refined carbs, recommended pt replace refined carbs with complex. Reviewed the benefits swapping in complex carbs and moderating portion sizes can have on managing blood glucose and blood lipids (triglycerides) with patient.Since orientation pt has started eating regularly across the day, 3 times a  day. Pt shared his wife doesn't like to cook and they have been eating out a lot. Reviewed the importance of looking up nutrition facts about restaurants and entrees before going out to eat, recommended pt do so starting today. Pt expressed understanding of the information reviewed. Pt aware of nutrition education classes offered and would like to attend nutrition classes.  Nutrition Diagnosis ? Food-and nutrition-related knowledge deficit related to lack of exposure to information as related to diagnosis of: ? CVD   Nutrition Intervention ? Pt's individual nutrition plan and goals reviewed with pt. ? Pt given handouts for: ? Nutrition I class ? Nutrition II class   Nutrition Goal(s):    ? Pt to identify and limit food sources of saturated fat, trans fat, refined carbohydrates and sodium ? Pt to eat regularly across the day ? Pt to eat more of and a variety of non-starchy vegetables. ? Pt to look up nutrition information of restaurants before eating out   Plan:  ? Pt to attend nutrition classes ? Nutrition I ? Nutrition II ? Portion Distortion  ? Will provide client-centered nutrition education as part of interdisciplinary care ? Monitor and evaluate progress toward nutrition goal with team.   Ross MarcusAubrey Burklin, MS, RD, LDN 03/23/2018 10:11 AM

## 2018-03-26 ENCOUNTER — Encounter (HOSPITAL_COMMUNITY)
Admission: RE | Admit: 2018-03-26 | Discharge: 2018-03-26 | Disposition: A | Discharge status: Home / Self Care | Payer: BLUE CROSS/BLUE SHIELD | Source: Ambulatory Visit | Attending: Cardiovascular Disease | Admitting: Cardiovascular Disease

## 2018-03-26 ENCOUNTER — Encounter (HOSPITAL_COMMUNITY): Payer: BLUE CROSS/BLUE SHIELD

## 2018-03-26 DIAGNOSIS — I214 Non-ST elevation (NSTEMI) myocardial infarction: Secondary | ICD-10-CM | POA: Diagnosis not present

## 2018-03-26 DIAGNOSIS — Z955 Presence of coronary angioplasty implant and graft: Secondary | ICD-10-CM | POA: Diagnosis not present

## 2018-03-26 DIAGNOSIS — I2510 Atherosclerotic heart disease of native coronary artery without angina pectoris: Secondary | ICD-10-CM | POA: Insufficient documentation

## 2018-03-26 NOTE — Progress Notes (Signed)
Damon [name] 58 y.o. male DOB: Jul 10, 1960 MRN: [id number]      Nutrition Note  1. NSTEMI (non-ST elevated myocardial infarction) (HCC) 11/20/17   2. Status post coronary artery stent placement 11/20/17 DES CFX    Past Medical History:  Diagnosis Date  . Allergy    seasonal   . Coronary artery disease   . Depression    situational   . Heart murmur    hx of in childhood   . Hyperlipidemia   . Hypertension   . Injury of tendon of long head of right biceps    January 2016   Meds reviewed.    Current Outpatient Medications (Cardiovascular):  .  atorvastatin (LIPITOR) 80 MG tablet, Take 1 tablet (80 mg total) by mouth daily at 6 PM. .  lisinopril (PRINIVIL,ZESTRIL) 20 MG tablet, Take 1 tablet (20 mg total) by mouth daily. .  metoprolol succinate (TOPROL XL) 25 MG 24 hr tablet, Take 1 tablet (25 mg total) by mouth daily. .  nitroGLYCERIN (NITROSTAT) 0.4 MG SL tablet, Place 1 tablet (0.4 mg total) under the tongue every 5 (five) minutes x 3 doses as needed for chest pain.  Current Outpatient Medications (Respiratory):  .  cetirizine (ZYRTEC) 10 MG tablet, Take 10 mg by mouth 2 (two) times daily as needed.   Current Outpatient Medications (Analgesics):  .  aspirin EC 81 MG EC tablet, Take 1 tablet (81 mg total) by mouth daily.  Current Outpatient Medications (Hematological):  .  ticagrelor (BRILINTA) 90 MG TABS tablet, Take 1 tablet (90 mg total) by mouth 2 (two) times daily.  Current Outpatient Medications (Other):  .  clonazePAM (KLONOPIN) 0.5 MG tablet, Take 0.5-1 mg by mouth See admin instructions. Take 2 tablets in the morning and 1 tablet in the evening. May take 1 additional tablet as needed for anxiety .  lisdexamfetamine (VYVANSE) 30 MG capsule, Take 30 mg by mouth as needed.    HT: Ht Readings from Last 1 Encounters:  02/08/18 5\' 8"  (1.727 m)    WT: Wt Readings from Last 5 Encounters:  02/08/18 178 lb 2.1 oz (80.8 kg)  12/21/17 170 lb (77.1 kg)  12/19/17 176 lb 4.8  oz (80 kg)  12/05/17 175 lb (79.4 kg)  11/20/17 171 lb 3.2 oz (77.7 kg)     Body mass index is 27.08 kg/m.   Current tobacco use? No  Labs:  Lipid Panel     Component Value Date/Time   CHOL 175 01/22/2018 0906   TRIG 444 (H) 01/22/2018 0906   HDL 41 01/22/2018 0906   CHOLHDL 4.3 01/22/2018 0906   CHOLHDL 3 12/19/2017 1219   VLDL 21.0 12/19/2017 1219   LDLCALC Comment 01/22/2018 0906   LDLDIRECT 93.0 12/09/2015 0820    No results found for: HGBA1C CBG (last 3)  No results for input(s): GLUCAP in the last 72 hours.  Nutrition Note Spoke with pt, nutrition plan and goals reviewed with pt. Pt is following heart healthy diet. Pt wants to continue to eat heart healthy. Heart healthy eating tips reviewed (label reading, how to build a healthy plate, portion sizes, eating frequently across the day). Pt continues to eat regularly across the day. Pt shared he and wife discussed how to eat more heart healthy at home and have negotiated decreasing number of meals eaten away from home. Praised pt for this change and encouraged him to keep up the good work. Reviewed the importance of looking up nutrition facts about restaurants and entrees before  going out to eat, recommended pt do so starting today, for when he and wife do eat out. Additionally distributed recipes to pt that would be heart healthy and made with 5 ingredients or less. Pt expressed understanding of the information reviewed. Pt aware of nutrition education classes offered and would like to attend nutrition classes.  Nutrition Diagnosis ? Food-and nutrition-related knowledge deficit related to lack of exposure to information as related to diagnosis of: ? CVD   Nutrition Intervention ? Pt's individual nutrition plan and goals reviewed with pt.  Nutrition Goal(s):  ? Pt to identify and limit food sources of saturated fat, trans fat, refined carbohydrates and sodium ? Pt to eat regularly across the day ? Pt to eat more of and a  variety of non-starchy vegetables. ? Pt to look up nutrition information of restaurants before eating out  Plan:  ? Pt to attend nutrition classes ? Nutrition I ? Nutrition II ? Portion Distortion  ? Will provide client-centered nutrition education as part of interdisciplinary care ? Monitor and evaluate progress toward nutrition goal with team.   Ross Marcus, MS, RD, LDN 03/26/2018 12:15 AM

## 2018-03-28 ENCOUNTER — Encounter (HOSPITAL_COMMUNITY)
Admission: RE | Admit: 2018-03-28 | Discharge: 2018-03-28 | Disposition: A | Discharge status: Home / Self Care | Payer: BLUE CROSS/BLUE SHIELD | Source: Ambulatory Visit | Attending: Cardiovascular Disease | Admitting: Cardiovascular Disease

## 2018-03-28 ENCOUNTER — Encounter (HOSPITAL_COMMUNITY): Payer: BLUE CROSS/BLUE SHIELD

## 2018-03-28 DIAGNOSIS — I214 Non-ST elevation (NSTEMI) myocardial infarction: Secondary | ICD-10-CM | POA: Diagnosis not present

## 2018-03-28 DIAGNOSIS — F4320 Adjustment disorder, unspecified: Secondary | ICD-10-CM | POA: Diagnosis not present

## 2018-03-28 DIAGNOSIS — Z955 Presence of coronary angioplasty implant and graft: Secondary | ICD-10-CM | POA: Diagnosis not present

## 2018-03-29 NOTE — Progress Notes (Signed)
Cardiac Individual Treatment Plan  Patient Details  Name: Damon [name] MRN: [id number] Date of Birth: 1960-12-24 Referring Provider:   Flowsheet Row CARDIAC REHAB PHASE II ORIENTATION from 02/08/2018 in [place]  Referring Provider  Dr. Sallyanne Kuster      Initial Encounter Date:  Flowsheet Row CARDIAC REHAB PHASE II ORIENTATION from 02/08/2018 in [place]  Date  02/08/18      Visit Diagnosis: Status post coronary artery stent placement 11/20/17 DES CFX  NSTEMI (non-ST elevated myocardial infarction) ([place]) 11/20/17  Patient's Home Medications on Admission:  Current Outpatient Medications:  .  aspirin EC 81 MG EC tablet, Take 1 tablet (81 mg total) by mouth daily., Disp: , Rfl:  .  atorvastatin (LIPITOR) 80 MG tablet, Take 1 tablet (80 mg total) by mouth daily at 6 PM., Disp: 90 tablet, Rfl: 2 .  carvedilol (COREG) 3.125 MG tablet, Take 1 tablet (3.125 mg total) by mouth 2 (two) times daily., Disp: 180 tablet, Rfl: 3 .  cetirizine (ZYRTEC) 10 MG tablet, Take 10 mg by mouth 2 (two) times daily as needed. , Disp: , Rfl:  .  clonazePAM (KLONOPIN) 0.5 MG tablet, Take 0.5-1 mg by mouth See admin instructions. Take 2 tablets in the morning and 1 tablet in the evening. May take 1 additional tablet as needed for anxiety, Disp: , Rfl:  .  lisdexamfetamine (VYVANSE) 30 MG capsule, Take 30 mg by mouth as needed. , Disp: , Rfl:  .  lisinopril (PRINIVIL,ZESTRIL) 20 MG tablet, Take 1 tablet (20 mg total) by mouth daily., Disp: 90 tablet, Rfl: 2 .  nitroGLYCERIN (NITROSTAT) 0.4 MG SL tablet, Place 1 tablet (0.4 mg total) under the tongue every 5 (five) minutes x 3 doses as needed for chest pain., Disp: 25 tablet, Rfl: 1 .  ticagrelor (BRILINTA) 90 MG TABS tablet, Take 1 tablet (90 mg total) by mouth 2 (two) times daily., Disp: 180 tablet, Rfl: 3  Past Medical History: Past Medical History:  Diagnosis Date  . Allergy    seasonal   .  Coronary artery disease   . Depression    situational   . Heart murmur    hx of in childhood   . Hyperlipidemia   . Hypertension   . Injury of tendon of long head of right biceps    January 2016    Tobacco Use: Social History   Tobacco Use  Smoking Status Former Smoker  . Packs/day: 1.50  . Years: 23.00  . Pack years: 34.50  . Types: Cigarettes  . Last attempt to quit: 02/22/1995  . Years since quitting: 23.1  Smokeless Tobacco Never Used    Labs: Recent Chemical engineer    Labs for ITP Cardiac and Pulmonary Rehab Latest Ref Rng & Units 12/09/2015 12/28/2016 11/18/2017 12/19/2017 01/22/2018   Cholestrol 100 - 199 mg/dL 177 148 180 154 175   LDLCALC 0 - 99 mg/dL - 85 99 82 Comment   LDLDIRECT mg/dL 93.0 - - - -   HDL >39 mg/dL 39.40 38.20(L) 48 51.40 41   Trlycerides 0 - 149 mg/dL 365.0(H) 124.0 165(H) 105.0 444(H)      Capillary Blood Glucose: No results found for: GLUCAP   Exercise Target Goals: Exercise Program Goal: Individual exercise prescription set using results from initial 6 min walk test and THRR while considering  patient's activity barriers and safety.   Exercise Prescription Goal: Initial exercise prescription builds to 30-45 minutes a day of  aerobic activity, 2-3 days per week.  Home exercise guidelines will be given to patient during program as part of exercise prescription that the participant will acknowledge.  Activity Barriers & Risk Stratification: Activity Barriers & Cardiac Risk Stratification - 02/08/18 1154    Activity Barriers & Cardiac Risk Stratification          Activity Barriers  None    Cardiac Risk Stratification  High           6 Minute Walk: 6 Minute Walk    6 Minute Walk    Row Name 02/08/18 1153   Phase  Initial   Distance  2032 feet   Walk Time  6 minutes   # of Rest Breaks  0   MPH  3.8   METS  4.9   RPE  9   VO2 Peak  17.07   Symptoms  No   Resting HR  70 bpm   Resting BP  104/74   Resting Oxygen Saturation    97 %   Exercise Oxygen Saturation  during 6 min walk  99 %   Max Ex. HR  108 bpm   Max Ex. BP  122/78   2 Minute Post BP  108/72          Oxygen Initial Assessment:   Oxygen Re-Evaluation:   Oxygen Discharge (Final Oxygen Re-Evaluation):   Initial Exercise Prescription: Initial Exercise Prescription - 02/08/18 1100    Date of Initial Exercise RX and Referring Provider          Date  02/08/18    Referring Provider  Dr. Sallyanne Kuster    Expected Discharge Date  05/18/18        Treadmill          MPH  3.8    Grade  2    Minutes  10        Elliptical          Level  2    Speed  2    Minutes  10        Rower          Level  4    Watts  60    Minutes  10        Prescription Details          Frequency (times per week)  3    Duration  Progress to 30 minutes of continuous aerobic without signs/symptoms of physical distress        Intensity          THRR 40-80% of Max Heartrate  65-130    Ratings of Perceived Exertion  11-13        Progression          Progression  Continue to progress workloads to maintain intensity without signs/symptoms of physical distress.        Resistance Training          Training Prescription  Yes    Weight  5 lbs.     Reps  10-15           Perform Capillary Blood Glucose checks as needed.  Exercise Prescription Changes: Exercise Prescription Changes    Response to Exercise    Row Name 02/16/18 1200 02/22/18 1400 03/26/18 1000   Blood Pressure (Admit)  122/60  114/72  122/80   Blood Pressure (Exercise)  168/72  152/64  128/74   Blood Pressure (Exit)  108/71  112/75  130/70   Heart Rate (  Admit)  71 bpm  86 bpm  78 bpm   Heart Rate (Exercise)  140 bpm  151 bpm  139 bpm   Heart Rate (Exit)  77 bpm  88 bpm  88 bpm   Rating of Perceived Exertion (Exercise)  _0 Comments  Pt first day of exercise  no documentation  no documentation   Duration  Progress to 30 minutes of  aerobic without signs/symptoms of physical  distress  Progress to 30 minutes of  aerobic without signs/symptoms of physical distress  Progress to 30 minutes of  aerobic without signs/symptoms of physical distress   Intensity  THRR unchanged  THRR unchanged  THRR unchanged       Progression    Row Name 02/16/18 1200 02/22/18 1400 03/26/18 1000   Progression  Continue to progress workloads to maintain intensity without signs/symptoms of physical distress.  Continue to progress workloads to maintain intensity without signs/symptoms of physical distress.  Continue to progress workloads to maintain intensity without signs/symptoms of physical distress.   Average METs  5.5  6.9  7.8       Resistance Training    Row Name 02/16/18 1200 02/22/18 1400 03/26/18 1000   Training Prescription  Yes  Yes  Yes   Weight  5 lbs.   6 lbs.   8 lbs.    Reps  10-15  10-15  10-15   Time  10 Minutes  10 Minutes  [place] Name 02/16/18 1200 02/22/18 1400 03/26/18 1000   MPH  3._1 Grade  _2 Minutes  _3 Elliptical    Row Name 02/16/18 1200 02/22/18 1400 03/26/18 1000   Level  _4 Speed  _5 Minutes  _6 Rower    Row Name 02/16/18 1200 02/22/18 1400 03/26/18 1000   Level  _7 Watts  63  72  120   Minutes  _8 Exercise Comments: Exercise Comments    Row Name 02/16/18 1225 02/22/18 1443 03/09/18 0946 03/28/18 1018   Exercise Comments  Pt first day of exercise. Tolerated well.   Reviewed METs and goals with Pt. Continues exercising at [employer].   Reviewed METs and goals with Pt. Continues exercising at [employer].   Reviewed METs and goals with Pt. Continues exercising at [employer].       Exercise Goals and Review: Exercise Goals    Exercise Goals    Row Name 02/08/18 1156   Increase Physical Activity  Yes   Intervention  Provide advice, education, support and counseling about physical activity/exercise needs.;Develop an individualized  exercise prescription for aerobic and resistive training based on initial evaluation findings, risk stratification, comorbidities and participant's personal goals.   Expected Outcomes  Short Term: Attend rehab on a regular basis to increase amount of physical activity.;Long Term: Add in home exercise to make exercise part of routine and to increase amount of physical activity.;Long Term: Exercising regularly at least 3-5 days a week.   Increase Strength and Stamina  Yes   Intervention  Provide advice, education, support and counseling about  physical activity/exercise needs.;Develop an individualized exercise prescription for aerobic and resistive training based on initial evaluation findings, risk stratification, comorbidities and participant's personal goals.   Expected Outcomes  Short Term: Increase workloads from initial exercise prescription for resistance, speed, and METs.;Short Term: Perform resistance training exercises routinely during rehab and add in resistance training at home;Long Term: Improve cardiorespiratory fitness, muscular endurance and strength as measured by increased METs and functional capacity (6MWT)   Able to understand and use rate of perceived exertion (RPE) scale  Yes   Intervention  Provide education and explanation on how to use RPE scale   Expected Outcomes  Short Term: Able to use RPE daily in rehab to express subjective intensity level;Long Term:  Able to use RPE to guide intensity level when exercising independently   Knowledge and understanding of Target Heart Rate Range (THRR)  Yes   Intervention  Provide education and explanation of THRR including how the numbers were predicted and where they are located for reference   Expected Outcomes  Short Term: Able to state/look up THRR;Long Term: Able to use THRR to govern intensity when exercising independently;Short Term: Able to use daily as guideline for intensity in rehab   Able to check pulse independently  Yes    Intervention  Provide education and demonstration on how to check pulse in carotid and radial arteries.;Review the importance of being able to check your own pulse for safety during independent exercise   Expected Outcomes  Short Term: Able to explain why pulse checking is important during independent exercise;Long Term: Able to check pulse independently and accurately   Understanding of Exercise Prescription  Yes   Intervention  Provide education, explanation, and written materials on patient's individual exercise prescription   Expected Outcomes  Short Term: Able to explain program exercise prescription;Long Term: Able to explain home exercise prescription to exercise independently          Exercise Goals Re-Evaluation : Exercise Goals Re-Evaluation    Exercise Goal Re-Evaluation    Row Name 02/16/18 1224 02/22/18 1442 03/28/18 1017   Exercise Goals Review  Increase Physical Activity;Increase Strength and Stamina;Able to check pulse independently;Understanding of Exercise Prescription;Knowledge and understanding of Target Heart Rate Range (THRR);Able to understand and use rate of perceived exertion (RPE) scale  Increase Physical Activity;Increase Strength and Stamina;Able to check pulse independently;Understanding of Exercise Prescription;Knowledge and understanding of Target Heart Rate Range (THRR);Able to understand and use rate of perceived exertion (RPE) scale  Increase Physical Activity;Increase Strength and Stamina;Able to check pulse independently;Understanding of Exercise Prescription;Knowledge and understanding of Target Heart Rate Range (THRR);Able to understand and use rate of perceived exertion (RPE) scale   Comments  Pt first day of exercise. Pt tolerated exercise Rx well. Will increase workloads as tolerated.   Reviewed METs and goals with Pt. MET level is 6.9 and Pt is progressing well. Continues at [employer] in addition to cardiac rehab.   Reviewed METs and goals with Pt. MET level  is 7.8 and is progressing well. Continues at [employer] 2-3 days per week doing aerobic training in addition to cardiac rehab.    Expected Outcomes  Will monitor and progress Pt as tolerated.   Will monitor and progress Pt as tolerated.   Will monitor and progress Pt as tolerated.           Discharge Exercise Prescription (Final Exercise Prescription Changes): Exercise Prescription Changes - 03/26/18 1000    Response to Exercise  Blood Pressure (Admit)  122/80    Blood Pressure (Exercise)  128/74    Blood Pressure (Exit)  130/70    Heart Rate (Admit)  78 bpm    Heart Rate (Exercise)  139 bpm    Heart Rate (Exit)  88 bpm    Rating of Perceived Exertion (Exercise)  11    Duration  Progress to 30 minutes of  aerobic without signs/symptoms of physical distress    Intensity  THRR unchanged        Progression          Progression  Continue to progress workloads to maintain intensity without signs/symptoms of physical distress.    Average METs  7.8        Resistance Training          Training Prescription  Yes    Weight  8 lbs.     Reps  10-15    Time  10 Minutes        Treadmill          MPH  4    Grade  3    Minutes  10        Elliptical          Level  2    Speed  2    Minutes  10        Rower          Level  5    Watts  120    Minutes  10           Nutrition:  Target Goals: Understanding of nutrition guidelines, daily intake of sodium <1582m, cholesterol <2041m calories 30% from fat and 7% or less from saturated fats, daily to have 5 or more servings of fruits and vegetables.  Biometrics: Pre Biometrics - 02/08/18 1157    Pre Biometrics          Height  _0  (1.727 m)    Weight  80.8 kg    Waist Circumference  38.5 inches    Hip Circumference  41 inches    Waist to Hip Ratio  0.94 %    BMI (Calculated)  27.09    Triceps Skinfold  19 mm    % Body Fat  27.3 %    Grip Strength  39 kg    Flexibility  17.5 in    Single Leg Stand  30  seconds            Nutrition Therapy Plan and Nutrition Goals: Nutrition Therapy & Goals - 03/23/18 1040    Nutrition Therapy          Diet  heart healthy        Personal Nutrition Goals          Nutrition Goal  Pt to identify and limit food sources of saturated fat, trans fat, refined carbohydrates and sodium    Personal Goal #2  Pt to eat regularly across the day    Personal Goal #3  Pt to eat more of and a variety of non-starchy vegetables.    Personal Goal #4  Pt to look up nutrition information of restaurants before eating out        Intervention Plan          Intervention  Prescribe, educate and counsel regarding individualized specific dietary modifications aiming towards targeted core components such as weight, hypertension, lipid management, diabetes, heart failure and other comorbidities.    Expected Outcomes  Short Term Goal:  Understand basic principles of dietary content, such as calories, fat, sodium, cholesterol and nutrients.;Long Term Goal: Adherence to prescribed nutrition plan.           Nutrition Assessments: Nutrition Assessments - 02/08/18 1610    MEDFICTS Scores          Pre Score  21           Nutrition Goals Re-Evaluation:   Nutrition Goals Re-Evaluation:   Nutrition Goals Discharge (Final Nutrition Goals Re-Evaluation):   Psychosocial: Target Goals: Acknowledge presence or absence of significant depression and/or stress, maximize coping skills, provide positive support system. Participant is able to verbalize types and ability to use techniques and skills needed for reducing stress and depression.  Initial Review & Psychosocial Screening: Initial Psych Review & Screening - 02/08/18 1221    Initial Review          Current issues with  Current Stress Concerns    Source of Stress Concerns  Family;Occupation    Comments  Elvan's mother has Alzheimer's. 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         Family Dynamics          Good Support System?  Yes        Barriers          Psychosocial barriers to participate in program  The patient should benefit from training in stress management and relaxation.        Screening Interventions          Interventions  Encouraged to exercise    Expected Outcomes  Short Term goal: Utilizing psychosocial counselor, staff and physician to assist with identification of specific Stressors or current issues interfering with healing process. Setting desired goal for each stressor or current issue identified.;Long Term Goal: Stressors or current issues are controlled or eliminated.           Quality of Life Scores: Quality of Life - 02/08/18 1222    Quality of Life          Select  Quality of Life        Quality of Life Scores          Health/Function Pre  26.3 %    Socioeconomic Pre  27.43 %    Psych/Spiritual Pre  28.07 %    Family Pre  26.4 %    GLOBAL Pre  26.91 %          Scores of 19 and below usually indicate a poorer quality of life in these areas.  A difference of  2-3 points is a clinically meaningful difference.  A difference of 2-3 points in the total score of the Quality of Life Index has been associated with significant improvement in overall quality of life, self-image, physical symptoms, and general health in studies assessing change in quality of life.  PHQ-9: Recent Review Flowsheet Data    Depression screen [facility] 2/9 02/16/2018 03/11/2016 04/23/2015 07/16/2014   Decreased Interest 0 0 0 0   Down, Depressed, Hopeless 0 0 0 0   PHQ - 2 Score 0 0 0 0     Interpretation of Total Score  Total Score Depression Severity:  1-4 = Minimal depression, 5-9 = Mild depression, 10-14 = Moderate depression, 15-19 = Moderately severe depression, 20-27 = Severe depression   Psychosocial Evaluation and Intervention: Psychosocial Evaluation - 02/16/18 1425    Psychosocial Evaluation & Interventions  Interventions  Stress  management education;Relaxation education;Encouraged to exercise with the program and follow exercise prescription    Comments  No psycosocial interventions necessary.    Expected Outcomes  Ichiro will report ability to manage his familial stressors and maintain a positive outook.     Continue Psychosocial Services   No Follow up required           Psychosocial Re-Evaluation: Psychosocial Re-Evaluation    Psychosocial Re-Evaluation    Row Name 02/28/18 1609 03/28/18 1211   Current issues with  Current Stress Concerns  Current Stress Concerns   Comments  pt with work related and family stress, otherwise no psychosocial needs identified, no interventions necessary   pt with work related and family stress, otherwise no psychosocial needs identified, no interventions necessary    Expected Outcomes  pt will exhibit positive outlook with good coping skills.   pt will exhibit positive outlook with good coping skills.    Interventions  Stress management education;Relaxation education;Encouraged to attend Cardiac Rehabilitation for the exercise  Stress management education;Relaxation education;Encouraged to attend Cardiac Rehabilitation for the exercise   Continue Psychosocial Services   Follow up required by staff  Follow up required by staff   Comments  Trevar's mother has Alzheimer's. 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  no documentation       Initial Review    Row Name 02/28/18 1609 03/28/18 1211   Source of Stress Concerns  Family  Family          Psychosocial Discharge (Final Psychosocial Re-Evaluation): Psychosocial Re-Evaluation - 03/28/18 1211    Psychosocial Re-Evaluation          Current issues with  Current Stress Concerns    Comments  pt with work related and family stress, otherwise no psychosocial needs identified, no interventions necessary     Expected Outcomes  pt will exhibit positive outlook with good coping skills.      Interventions  Stress management education;Relaxation education;Encouraged to attend Cardiac Rehabilitation for the exercise    Continue Psychosocial Services   Follow up required by staff        Initial Review          Source of Stress Concerns  Family           Vocational Rehabilitation: Provide vocational rehab assistance to qualifying candidates.   Vocational Rehab Evaluation & Intervention: Vocational Rehab - 02/08/18 1223    Initial Vocational Rehab Evaluation & Intervention          Assessment shows need for Vocational Rehabilitation  No   Kay does not need vocational rehab at this time Simultaneous filing. User may not have seen previous data.          Education: Education Goals: Education classes will be provided on a weekly basis, covering required topics. Participant will state understanding/return demonstration of topics presented.  Learning Barriers/Preferences: Learning Barriers/Preferences - 02/08/18 1158    Learning Barriers/Preferences          Learning Barriers  Sight    Learning Preferences  Audio;Computer/Internet;Group Instruction;Individual Instruction;Pictoral;Skilled Demonstration;Verbal Instruction;Video;Written Material           Education Topics: Count Your Pulse:  -Group instruction provided by verbal instruction, demonstration, patient participation and written materials to support subject.  Instructors address importance of being able to find your pulse and how to count your pulse when at home without a heart monitor.  Patients get hands on experience  counting their pulse with staff help and individually.   Heart Attack, Angina, and Risk Factor Modification:  -Group instruction provided by verbal instruction, video, and written materials to support subject.  Instructors address signs and symptoms of angina and heart attacks.    Also discuss risk factors for heart disease and how to make changes to improve heart health risk  factors.   Functional Fitness:  -Group instruction provided by verbal instruction, demonstration, patient participation, and written materials to support subject.  Instructors address safety measures for doing things around the house.  Discuss how to get up and down off the floor, how to pick things up properly, how to safely get out of a chair without assistance, and balance training.   Meditation and Mindfulness:  -Group instruction provided by verbal instruction, patient participation, and written materials to support subject.  Instructor addresses importance of mindfulness and meditation practice to help reduce stress and improve awareness.  Instructor also leads participants through a meditation exercise.    Stretching for Flexibility and Mobility:  -Group instruction provided by verbal instruction, patient participation, and written materials to support subject.  Instructors lead participants through series of stretches that are designed to increase flexibility thus improving mobility.  These stretches are additional exercise for major muscle groups that are typically performed during regular warm up and cool down.   Hands Only CPR:  -Group verbal, video, and participation provides a basic overview of AHA guidelines for community CPR. Role-play of emergencies allow participants the opportunity to practice calling for help and chest compression technique with discussion of AED use.   Hypertension: -Group verbal and written instruction that provides a basic overview of hypertension including the most recent diagnostic guidelines, risk factor reduction with self-care instructions and medication management.    Nutrition I class: Heart Healthy Eating:  -Group instruction provided by PowerPoint slides, verbal discussion, and written materials to support subject matter. The instructor gives an explanation and review of the Therapeutic Lifestyle Changes diet recommendations, which includes a  discussion on lipid goals, dietary fat, sodium, fiber, plant stanol/sterol esters, sugar, and the components of a well-balanced, healthy diet.   Nutrition II class: Lifestyle Skills:  -Group instruction provided by PowerPoint slides, verbal discussion, and written materials to support subject matter. The instructor gives an explanation and review of label reading, grocery shopping for heart health, heart healthy recipe modifications, and ways to make healthier choices when eating out.   Diabetes Question & Answer:  -Group instruction provided by PowerPoint slides, verbal discussion, and written materials to support subject matter. The instructor gives an explanation and review of diabetes co-morbidities, pre- and post-prandial blood glucose goals, pre-exercise blood glucose goals, signs, symptoms, and treatment of hypoglycemia and hyperglycemia, and foot care basics.   Diabetes Blitz:  -Group instruction provided by PowerPoint slides, verbal discussion, and written materials to support subject matter. The instructor gives an explanation and review of the physiology behind type 1 and type 2 diabetes, diabetes medications and rational behind using different medications, pre- and post-prandial blood glucose recommendations and Hemoglobin A1c goals, diabetes diet, and exercise including blood glucose guidelines for exercising safely.    Portion Distortion:  -Group instruction provided by PowerPoint slides, verbal discussion, written materials, and food models to support subject matter. The instructor gives an explanation of serving size versus portion size, changes in portions sizes over the last 20 years, and what consists of a serving from each food group.   Stress Management:  -Group instruction provided by verbal  instruction, video, and written materials to support subject matter.  Instructors review role of stress in heart disease and how to cope with stress positively.     Exercising on Your  Own:  -Group instruction provided by verbal instruction, power point, and written materials to support subject.  Instructors discuss benefits of exercise, components of exercise, frequency and intensity of exercise, and end points for exercise.  Also discuss use of nitroglycerin and activating EMS.  Review options of places to exercise outside of rehab.  Review guidelines for sex with heart disease.   Cardiac Drugs I:  -Group instruction provided by verbal instruction and written materials to support subject.  Instructor reviews cardiac drug classes: antiplatelets, anticoagulants, beta blockers, and statins.  Instructor discusses reasons, side effects, and lifestyle considerations for each drug class.   Cardiac Drugs II:  -Group instruction provided by verbal instruction and written materials to support subject.  Instructor reviews cardiac drug classes: angiotensin converting enzyme inhibitors (ACE-I), angiotensin II receptor blockers (ARBs), nitrates, and calcium channel blockers.  Instructor discusses reasons, side effects, and lifestyle considerations for each drug class.   Anatomy and Physiology of the Circulatory System:  Group verbal and written instruction and models provide basic cardiac anatomy and physiology, with the coronary electrical and arterial systems. Review of: AMI, Angina, Valve disease, Heart Failure, Peripheral Artery Disease, Cardiac Arrhythmia, Pacemakers, and the ICD.   Other Education:  -Group or individual verbal, written, or video instructions that support the educational goals of the cardiac rehab program.   Holiday Eating Survival Tips:  -Group instruction provided by PowerPoint slides, verbal discussion, and written materials to support subject matter. The instructor gives patients tips, tricks, and techniques to help them not only survive but enjoy the holidays despite the onslaught of food that accompanies the holidays.   Knowledge Questionnaire  Score: Knowledge Questionnaire Score - 02/08/18 1201    Knowledge Questionnaire Score          Pre Score  16/24           Core Components/Risk Factors/Patient Goals at Admission: Personal Goals and Risk Factors at Admission - 02/08/18 1158    Core Components/Risk Factors/Patient Goals on Admission           Weight Management  Yes;Weight Maintenance;Weight Loss    Intervention  Weight Management: Develop a combined nutrition and exercise program designed to reach desired caloric intake, while maintaining appropriate intake of nutrient and fiber, sodium and fats, and appropriate energy expenditure required for the weight goal.;Weight Management: Provide education and appropriate resources to help participant work on and attain dietary goals.;Weight Management/Obesity: Establish reasonable short term and long term weight goals.    Admit Weight  178 lb 2.1 oz (80.8 kg)    Expected Outcomes  Short Term: Continue to assess and modify interventions until short term weight is achieved;Long Term: Adherence to nutrition and physical activity/exercise program aimed toward attainment of established weight goal;Weight Maintenance: Understanding of the daily nutrition guidelines, which includes 25-35% calories from fat, 7% or less cal from saturated fats, less than 284m cholesterol, less than 1.5gm of sodium, & 5 or more servings of fruits and vegetables daily;Weight Loss: Understanding of general recommendations for a balanced deficit meal plan, which promotes 1-2 lb weight loss per week and includes a negative energy balance of [phone] kcal/d;Understanding recommendations for meals to include 15-35% energy as protein, 25-35% energy from fat, 35-60% energy from carbohydrates, less than 2080mof dietary cholesterol, 20-35 gm of total fiber daily;Understanding of  distribution of calorie intake throughout the day with the consumption of 4-5 meals/snacks    Hypertension  Yes    Intervention  Provide education on  lifestyle modifcations including regular physical activity/exercise, weight management, moderate sodium restriction and increased consumption of fresh fruit, vegetables, and low fat dairy, alcohol moderation, and smoking cessation.;Monitor prescription use compliance.    Expected Outcomes  Short Term: Continued assessment and intervention until BP is < 140/21m HG in hypertensive participants. < 130/874mHG in hypertensive participants with diabetes, heart failure or chronic kidney disease.;Long Term: Maintenance of blood pressure at goal levels.    Lipids  Yes    Intervention  Provide education and support for participant on nutrition & aerobic/resistive exercise along with prescribed medications to achieve LDL '70mg'$ , HDL >'40mg'$ .    Expected Outcomes  Short Term: Participant states understanding of desired cholesterol values and is compliant with medications prescribed. Participant is following exercise prescription and nutrition guidelines.;Long Term: Cholesterol controlled with medications as prescribed, with individualized exercise RX and with personalized nutrition plan. Value goals: LDL < '70mg'$ , HDL > 40 mg.    Stress  Yes    Intervention  Offer individual and/or small group education and counseling on adjustment to heart disease, stress management and health-related lifestyle change. Teach and support self-help strategies.;Refer participants experiencing significant psychosocial distress to appropriate mental health specialists for further evaluation and treatment. When possible, include family members and significant others in education/counseling sessions.    Expected Outcomes  Short Term: Participant demonstrates changes in health-related behavior, relaxation and other stress management skills, ability to obtain effective social support, and compliance with psychotropic medications if prescribed.;Long Term: Emotional wellbeing is indicated by absence of clinically significant psychosocial distress or  social isolation.           Core Components/Risk Factors/Patient Goals Review:  Goals and Risk Factor Review    Core Components/Risk Factors/Patient Goals Review    Row Name 02/16/18 1438 03/28/18 1211   Personal Goals Review  Weight Management/Obesity;Lipids;Hypertension;Stress  Weight Management/Obesity;Lipids;Hypertension;Stress   Review  Pt with multiple CAD RFs willing to participate in CR exercise.  HaSaivionould like to lose weight and keep his heart strong.   Pt with multiple CAD RFs willing to participate in CR exercise.  HaWestinould like to lose weight and keep his heart strong. pt reports he is discouraged about inability to lose weight thus far.  pt encouraged to continue weight loss efforts. pt over working on some equipment and instructed to back off for desired aerobic benefits.  understanding verbalized.     Expected Outcomes  Pt will continue to participate in CR exercise, nutrition, and lifestyle modification.   Pt will continue to participate in CR exercise, nutrition, and lifestyle modification.           Core Components/Risk Factors/Patient Goals at Discharge (Final Review):  Goals and Risk Factor Review - 03/28/18 1211    Core Components/Risk Factors/Patient Goals Review          Personal Goals Review  Weight Management/Obesity;Lipids;Hypertension;Stress    Review  Pt with multiple CAD RFs willing to participate in CR exercise.  HaAimarould like to lose weight and keep his heart strong. pt reports he is discouraged about inability to lose weight thus far.  pt encouraged to continue weight loss efforts. pt over working on some equipment and instructed to back off for desired aerobic benefits.  understanding verbalized.      Expected Outcomes  Pt will continue to participate in CR  exercise, nutrition, and lifestyle modification.            ITP Comments: ITP Comments    Row Name 02/08/18 1153 02/16/18 1420 03/29/18 1044   ITP Comments  Dr. Fransico Him, Medical  Director  30 Day ITP Review.  Pt started exercise today and tolerated it well.   30 Day ITP Review.  Pt with good attendance and participation. pt demonstrates eagerness to participate in CR program. pt absences work related travel.       Comments:

## 2018-03-30 ENCOUNTER — Encounter (HOSPITAL_COMMUNITY): Payer: BLUE CROSS/BLUE SHIELD

## 2018-04-02 ENCOUNTER — Encounter (HOSPITAL_COMMUNITY): Payer: BLUE CROSS/BLUE SHIELD

## 2018-04-04 ENCOUNTER — Encounter (HOSPITAL_COMMUNITY): Payer: BLUE CROSS/BLUE SHIELD

## 2018-04-06 ENCOUNTER — Encounter (HOSPITAL_COMMUNITY): Payer: BLUE CROSS/BLUE SHIELD

## 2018-04-06 ENCOUNTER — Encounter (HOSPITAL_COMMUNITY)
Admission: RE | Admit: 2018-04-06 | Discharge: 2018-04-06 | Disposition: A | Discharge status: Home / Self Care | Payer: BLUE CROSS/BLUE SHIELD | Source: Ambulatory Visit | Attending: Cardiovascular Disease | Admitting: Cardiovascular Disease

## 2018-04-06 DIAGNOSIS — I214 Non-ST elevation (NSTEMI) myocardial infarction: Secondary | ICD-10-CM | POA: Diagnosis not present

## 2018-04-06 DIAGNOSIS — F4320 Adjustment disorder, unspecified: Secondary | ICD-10-CM | POA: Diagnosis not present

## 2018-04-06 DIAGNOSIS — Z955 Presence of coronary angioplasty implant and graft: Secondary | ICD-10-CM

## 2018-04-09 ENCOUNTER — Encounter (HOSPITAL_COMMUNITY): Payer: BLUE CROSS/BLUE SHIELD

## 2018-04-09 ENCOUNTER — Encounter (HOSPITAL_COMMUNITY)
Admission: RE | Admit: 2018-04-09 | Discharge: 2018-04-09 | Disposition: A | Discharge status: Home / Self Care | Payer: BLUE CROSS/BLUE SHIELD | Source: Ambulatory Visit | Attending: Cardiovascular Disease | Admitting: Cardiovascular Disease

## 2018-04-09 DIAGNOSIS — Z955 Presence of coronary angioplasty implant and graft: Secondary | ICD-10-CM | POA: Diagnosis not present

## 2018-04-09 DIAGNOSIS — I214 Non-ST elevation (NSTEMI) myocardial infarction: Secondary | ICD-10-CM | POA: Diagnosis not present

## 2018-04-11 ENCOUNTER — Encounter (HOSPITAL_COMMUNITY): Payer: BLUE CROSS/BLUE SHIELD

## 2018-04-11 ENCOUNTER — Encounter (HOSPITAL_COMMUNITY)
Admission: RE | Admit: 2018-04-11 | Discharge: 2018-04-11 | Disposition: A | Discharge status: Home / Self Care | Payer: BLUE CROSS/BLUE SHIELD | Source: Ambulatory Visit | Attending: Cardiovascular Disease | Admitting: Cardiovascular Disease

## 2018-04-11 DIAGNOSIS — Z955 Presence of coronary angioplasty implant and graft: Secondary | ICD-10-CM

## 2018-04-11 DIAGNOSIS — I214 Non-ST elevation (NSTEMI) myocardial infarction: Secondary | ICD-10-CM | POA: Diagnosis not present

## 2018-04-13 ENCOUNTER — Encounter (HOSPITAL_COMMUNITY)
Admission: RE | Admit: 2018-04-13 | Discharge: 2018-04-13 | Disposition: A | Discharge status: Home / Self Care | Payer: BLUE CROSS/BLUE SHIELD | Source: Ambulatory Visit | Attending: Cardiovascular Disease | Admitting: Cardiovascular Disease

## 2018-04-13 ENCOUNTER — Encounter (HOSPITAL_COMMUNITY): Payer: BLUE CROSS/BLUE SHIELD

## 2018-04-13 DIAGNOSIS — I214 Non-ST elevation (NSTEMI) myocardial infarction: Secondary | ICD-10-CM | POA: Diagnosis not present

## 2018-04-13 DIAGNOSIS — Z955 Presence of coronary angioplasty implant and graft: Secondary | ICD-10-CM

## 2018-04-16 ENCOUNTER — Encounter (HOSPITAL_COMMUNITY): Payer: BLUE CROSS/BLUE SHIELD

## 2018-04-16 ENCOUNTER — Encounter (HOSPITAL_COMMUNITY)
Admission: RE | Admit: 2018-04-16 | Discharge: 2018-04-16 | Disposition: A | Discharge status: Home / Self Care | Payer: BLUE CROSS/BLUE SHIELD | Source: Ambulatory Visit | Attending: Cardiovascular Disease | Admitting: Cardiovascular Disease

## 2018-04-16 DIAGNOSIS — I214 Non-ST elevation (NSTEMI) myocardial infarction: Secondary | ICD-10-CM | POA: Diagnosis not present

## 2018-04-16 DIAGNOSIS — Z955 Presence of coronary angioplasty implant and graft: Secondary | ICD-10-CM

## 2018-04-18 ENCOUNTER — Encounter (HOSPITAL_COMMUNITY): Payer: BLUE CROSS/BLUE SHIELD

## 2018-04-18 ENCOUNTER — Encounter (HOSPITAL_COMMUNITY)
Admission: RE | Admit: 2018-04-18 | Discharge: 2018-04-18 | Disposition: A | Discharge status: Home / Self Care | Payer: BLUE CROSS/BLUE SHIELD | Source: Ambulatory Visit | Attending: Cardiovascular Disease | Admitting: Cardiovascular Disease

## 2018-04-18 DIAGNOSIS — Z955 Presence of coronary angioplasty implant and graft: Secondary | ICD-10-CM | POA: Diagnosis not present

## 2018-04-18 DIAGNOSIS — I214 Non-ST elevation (NSTEMI) myocardial infarction: Secondary | ICD-10-CM

## 2018-04-19 ENCOUNTER — Encounter: Payer: Self-pay | Admitting: Adult Health

## 2018-04-19 ENCOUNTER — Ambulatory Visit (INDEPENDENT_AMBULATORY_CARE_PROVIDER_SITE_OTHER): Payer: BLUE CROSS/BLUE SHIELD | Admitting: Adult Health

## 2018-04-19 VITALS — BP 126/84 | Temp 97.7°F | Wt 183.0 lb

## 2018-04-19 DIAGNOSIS — R6889 Other general symptoms and signs: Secondary | ICD-10-CM | POA: Diagnosis not present

## 2018-04-19 LAB — POC INFLUENZA A&B (BINAX/QUICKVUE)
Influenza A, POC: NEGATIVE
Influenza B, POC: NEGATIVE

## 2018-04-19 NOTE — Progress Notes (Signed)
Subjective:    Patient ID: Damon [name], male    DOB: 08/27/1960, 58 y.o.   MRN: [id number]  HPI 58 year old male who  has a past medical history of Allergy, Coronary artery disease, Depression, Heart murmur, Hyperlipidemia, Hypertension, and Injury of tendon of long head of right biceps.  He presents to the office today for acute complaint of sore throat and decreased appetite since waking up this morning. Also complains of headache and ear pain    He denies fevers or chills or feeling acutely ill.    Review of Systems See HPI   Past Medical History:  Diagnosis Date  . Allergy    seasonal   . Coronary artery disease   . Depression    situational   . Heart murmur    hx of in childhood   . Hyperlipidemia   . Hypertension   . Injury of tendon of long head of right biceps    January 2016    Social History   Socioeconomic History  . Marital status: Married    Spouse name: Not on file  . Number of children: Not on file  . Years of education: Not on file  . Highest education level: Professional school degree (e.g., MD, DDS, DVM, JD)  Occupational History  . Not on file  Social Needs  . Financial resource strain: Not hard at all  . Food insecurity:    Worry: Never true    Inability: Never true  . Transportation needs:    Medical: No    Non-medical: No  Tobacco Use  . Smoking status: Former Smoker    Packs/day: 1.50    Years: 23.00    Pack years: 34.50    Types: Cigarettes    Last attempt to quit: 02/22/1995    Years since quitting: 23.1  . Smokeless tobacco: Never Used  Substance and Sexual Activity  . Alcohol use: Yes    Comment: occas  . Drug use: No  . Sexual activity: Not on file  Lifestyle  . Physical activity:    Days per week: 4 days    Minutes per session: 30 min  . Stress: Rather much  Relationships  . Social connections:    Talks on phone: Not on file    Gets together: Not on file    Attends religious service: Not on file    Active member of  club or organization: Not on file    Attends meetings of clubs or organizations: Not on file    Relationship status: Not on file  . Intimate partner violence:    Fear of current or ex partner: Not on file    Emotionally abused: Not on file    Physically abused: Not on file    Forced sexual activity: Not on file  Other Topics Concern  . Not on file  Social History Narrative  . Not on file    Past Surgical History:  Procedure Laterality Date  . APPENDECTOMY    . CARDIAC CATHETERIZATION    . COLONOSCOPY N/A 11/28/2014   Procedure: COLONOSCOPY;  Surgeon: Louis Meckel, MD;  Location: [facility];  Service: Endoscopy;  Laterality: N/A;  . colonscopy      removed polyps  . CORONARY STENT INTERVENTION N/A 11/20/2017   Procedure: CORONARY STENT INTERVENTION;  Surgeon: Runell Gess, MD;  Location: [facility];  Service: Cardiovascular;  Laterality: N/A;  . INGUINAL HERNIA REPAIR Left 06/12/2014   Procedure: LEFT INGUINAL HERNIA  REPAIR WITH MESH;  Surgeon: Darnell Level, MD;  Location: [facility];  Service: General;  Laterality: Left;  . INSERTION OF MESH Left 06/12/2014   Procedure: INSERTION OF MESH;  Surgeon: Darnell Level, MD;  Location: [facility];  Service: General;  Laterality: Left;  . LEFT HEART CATH AND CORONARY ANGIOGRAPHY N/A 11/20/2017   Procedure: LEFT HEART CATH AND CORONARY ANGIOGRAPHY;  Surgeon: Runell Gess, MD;  Location: [facility];  Service: Cardiovascular;  Laterality: N/A;  . MOUTH SURGERY     wisdom teeth removed;root canal - 03/2014  . TONSILLECTOMY  1995  . TONSILLECTOMY      Family History  Problem Relation Age of Onset  . Depression Other   . Hyperlipidemia Other   . Hypertension Other   . Cancer Other        prostate  . Stroke Other   . Heart disease Other   . Cancer Paternal Grandfather   . Heart disease Paternal Grandfather   . Alzheimer's disease Mother   . AAA (abdominal aortic aneurysm) Mother     Allergies  Allergen Reactions  .  Poison Ivy Extract [Poison Ivy Extract] Itching and Rash  . Poison Oak Extract [Poison Oak Extract] Itching and Rash    Current Outpatient Medications on File Prior to Visit  Medication Sig Dispense Refill  . aspirin EC 81 MG EC tablet Take 1 tablet (81 mg total) by mouth daily.    Marland Kitchen atorvastatin (LIPITOR) 80 MG tablet Take 1 tablet (80 mg total) by mouth daily at 6 PM. 90 tablet 2  . carvedilol (COREG) 3.125 MG tablet Take 1 tablet (3.125 mg total) by mouth 2 (two) times daily. 180 tablet 3  . cetirizine (ZYRTEC) 10 MG tablet Take 10 mg by mouth 2 (two) times daily as needed.     . clonazePAM (KLONOPIN) 0.5 MG tablet Take 0.5-1 mg by mouth See admin instructions. Take 2 tablets in the morning and 1 tablet in the evening. May take 1 additional tablet as needed for anxiety    . lisdexamfetamine (VYVANSE) 30 MG capsule Take 30 mg by mouth as needed.     Marland Kitchen lisinopril (PRINIVIL,ZESTRIL) 20 MG tablet Take 1 tablet (20 mg total) by mouth daily. 90 tablet 2  . nitroGLYCERIN (NITROSTAT) 0.4 MG SL tablet Place 1 tablet (0.4 mg total) under the tongue every 5 (five) minutes x 3 doses as needed for chest pain. 25 tablet 1  . ticagrelor (BRILINTA) 90 MG TABS tablet Take 1 tablet (90 mg total) by mouth 2 (two) times daily. 180 tablet 3   No current facility-administered medications on file prior to visit.     BP 126/84   Temp 97.7 F (36.5 C)   Wt 183 lb (83 kg)   BMI 27.42 kg/m       Objective:   Physical Exam Vitals signs reviewed.  Constitutional:      Appearance: Normal appearance.  HENT:     Right Ear: Tympanic membrane, ear canal and external ear normal. There is no impacted cerumen.     Left Ear: Tympanic membrane, ear canal and external ear normal. There is no impacted cerumen.     Nose: Nose normal. No congestion.     Mouth/Throat:     Mouth: Mucous membranes are moist.     Pharynx: Oropharynx is clear. Uvula midline. No pharyngeal swelling, oropharyngeal exudate or posterior  oropharyngeal erythema.     Tonsils: No tonsillar exudate.  Eyes:     Extraocular  Movements: Extraocular movements intact.     Pupils: Pupils are equal, round, and reactive to light.  Cardiovascular:     Rate and Rhythm: Normal rate and regular rhythm.     Pulses: Normal pulses.     Heart sounds: Normal heart sounds.  Pulmonary:     Effort: Pulmonary effort is normal.     Breath sounds: Normal breath sounds.  Skin:    General: Skin is warm and dry.     Capillary Refill: Capillary refill takes less than 2 seconds.  Neurological:     General: No focal deficit present.     Mental Status: He is alert.  Psychiatric:        Mood and Affect: Mood normal.        Behavior: Behavior normal.        Thought Content: Thought content normal.        Judgment: Judgment normal.        Assessment & Plan:  1. Flu-like symptoms  - POC Influenza A&B(BINAX/QUICKVUE)- negative  - No signs of bacterial infection  - Advised rest, hydration and tylenol for symptom relief  - Follow up if no improvement   Shirline Frees, NP

## 2018-04-20 ENCOUNTER — Encounter (HOSPITAL_COMMUNITY): Payer: BLUE CROSS/BLUE SHIELD

## 2018-04-23 ENCOUNTER — Encounter (HOSPITAL_COMMUNITY): Payer: BLUE CROSS/BLUE SHIELD

## 2018-04-23 ENCOUNTER — Encounter (HOSPITAL_COMMUNITY)
Admission: RE | Admit: 2018-04-23 | Discharge: 2018-04-23 | Disposition: A | Discharge status: Home / Self Care | Payer: BLUE CROSS/BLUE SHIELD | Source: Ambulatory Visit | Attending: Cardiovascular Disease | Admitting: Cardiovascular Disease

## 2018-04-23 DIAGNOSIS — Z955 Presence of coronary angioplasty implant and graft: Secondary | ICD-10-CM | POA: Insufficient documentation

## 2018-04-23 DIAGNOSIS — I214 Non-ST elevation (NSTEMI) myocardial infarction: Secondary | ICD-10-CM | POA: Insufficient documentation

## 2018-04-23 DIAGNOSIS — I2510 Atherosclerotic heart disease of native coronary artery without angina pectoris: Secondary | ICD-10-CM | POA: Insufficient documentation

## 2018-04-24 DIAGNOSIS — F4320 Adjustment disorder, unspecified: Secondary | ICD-10-CM | POA: Diagnosis not present

## 2018-04-25 ENCOUNTER — Encounter (HOSPITAL_COMMUNITY)
Admission: RE | Admit: 2018-04-25 | Discharge: 2018-04-25 | Disposition: A | Discharge status: Home / Self Care | Payer: BLUE CROSS/BLUE SHIELD | Source: Ambulatory Visit | Attending: Cardiovascular Disease | Admitting: Cardiovascular Disease

## 2018-04-25 ENCOUNTER — Encounter (HOSPITAL_COMMUNITY): Payer: BLUE CROSS/BLUE SHIELD

## 2018-04-25 DIAGNOSIS — Z955 Presence of coronary angioplasty implant and graft: Secondary | ICD-10-CM | POA: Diagnosis not present

## 2018-04-25 DIAGNOSIS — I214 Non-ST elevation (NSTEMI) myocardial infarction: Secondary | ICD-10-CM

## 2018-04-25 NOTE — Progress Notes (Signed)
Cardiac Individual Treatment Plan  Patient Details  Name: Damon [name] MRN: [id number] Date of Birth: 04-06-1960 Referring Provider:   Flowsheet Row CARDIAC REHAB PHASE II ORIENTATION from 02/08/2018 in [place]  Referring Provider  Dr. Sallyanne Kuster      Initial Encounter Date:  Flowsheet Row CARDIAC REHAB PHASE II ORIENTATION from 02/08/2018 in [place]  Date  02/08/18      Visit Diagnosis: NSTEMI (non-ST elevated myocardial infarction) ([place]) 11/20/17  Status post coronary artery stent placement 11/20/17 DES CFX  Patient's Home Medications on Admission:  Current Outpatient Medications:  .  aspirin EC 81 MG EC tablet, Take 1 tablet (81 mg total) by mouth daily., Disp: , Rfl:  .  atorvastatin (LIPITOR) 80 MG tablet, Take 1 tablet (80 mg total) by mouth daily at 6 PM., Disp: 90 tablet, Rfl: 2 .  carvedilol (COREG) 3.125 MG tablet, Take 1 tablet (3.125 mg total) by mouth 2 (two) times daily., Disp: 180 tablet, Rfl: 3 .  cetirizine (ZYRTEC) 10 MG tablet, Take 10 mg by mouth 2 (two) times daily as needed. , Disp: , Rfl:  .  clonazePAM (KLONOPIN) 0.5 MG tablet, Take 0.5-1 mg by mouth See admin instructions. Take 2 tablets in the morning and 1 tablet in the evening. May take 1 additional tablet as needed for anxiety, Disp: , Rfl:  .  lisdexamfetamine (VYVANSE) 30 MG capsule, Take 30 mg by mouth as needed. , Disp: , Rfl:  .  lisinopril (PRINIVIL,ZESTRIL) 20 MG tablet, Take 1 tablet (20 mg total) by mouth daily., Disp: 90 tablet, Rfl: 2 .  nitroGLYCERIN (NITROSTAT) 0.4 MG SL tablet, Place 1 tablet (0.4 mg total) under the tongue every 5 (five) minutes x 3 doses as needed for chest pain., Disp: 25 tablet, Rfl: 1 .  ticagrelor (BRILINTA) 90 MG TABS tablet, Take 1 tablet (90 mg total) by mouth 2 (two) times daily., Disp: 180 tablet, Rfl: 3  Past Medical History: Past Medical History:  Diagnosis Date  . Allergy    seasonal   .  Coronary artery disease   . Depression    situational   . Heart murmur    hx of in childhood   . Hyperlipidemia   . Hypertension   . Injury of tendon of long head of right biceps    January 2016    Tobacco Use: Social History   Tobacco Use  Smoking Status Former Smoker  . Packs/day: 1.50  . Years: 23.00  . Pack years: 34.50  . Types: Cigarettes  . Last attempt to quit: 02/22/1995  . Years since quitting: 23.1  Smokeless Tobacco Never Used    Labs: Recent Chemical engineer    Labs for ITP Cardiac and Pulmonary Rehab Latest Ref Rng & Units 12/09/2015 12/28/2016 11/18/2017 12/19/2017 01/22/2018   Cholestrol 100 - 199 mg/dL 177 148 180 154 175   LDLCALC 0 - 99 mg/dL - 85 99 82 Comment   LDLDIRECT mg/dL 93.0 - - - -   HDL >39 mg/dL 39.40 38.20(L) 48 51.40 41   Trlycerides 0 - 149 mg/dL 365.0(H) 124.0 165(H) 105.0 444(H)      Capillary Blood Glucose: No results found for: GLUCAP   Exercise Target Goals: Exercise Program Goal: Individual exercise prescription set using results from initial 6 min walk test and THRR while considering  patient's activity barriers and safety.   Exercise Prescription Goal: Initial exercise prescription builds to 30-45 minutes a day of  aerobic activity, 2-3 days per week.  Home exercise guidelines will be given to patient during program as part of exercise prescription that the participant will acknowledge.  Activity Barriers & Risk Stratification: Activity Barriers & Cardiac Risk Stratification - 02/08/18 1154    Activity Barriers & Cardiac Risk Stratification          Activity Barriers  None    Cardiac Risk Stratification  High           6 Minute Walk: 6 Minute Walk    6 Minute Walk    Row Name 02/08/18 1153   Phase  Initial   Distance  2032 feet   Walk Time  6 minutes   # of Rest Breaks  0   MPH  3.8   METS  4.9   RPE  9   VO2 Peak  17.07   Symptoms  No   Resting HR  70 bpm   Resting BP  104/74   Resting Oxygen Saturation    97 %   Exercise Oxygen Saturation  during 6 min walk  99 %   Max Ex. HR  108 bpm   Max Ex. BP  122/78   2 Minute Post BP  108/72          Oxygen Initial Assessment:   Oxygen Re-Evaluation:   Oxygen Discharge (Final Oxygen Re-Evaluation):   Initial Exercise Prescription: Initial Exercise Prescription - 02/08/18 1100    Date of Initial Exercise RX and Referring Provider          Date  02/08/18    Referring Provider  Dr. Sallyanne Kuster    Expected Discharge Date  05/18/18        Treadmill          MPH  3.8    Grade  2    Minutes  10        Elliptical          Level  2    Speed  2    Minutes  10        Rower          Level  4    Watts  60    Minutes  10        Prescription Details          Frequency (times per week)  3    Duration  Progress to 30 minutes of continuous aerobic without signs/symptoms of physical distress        Intensity          THRR 40-80% of Max Heartrate  65-130    Ratings of Perceived Exertion  11-13        Progression          Progression  Continue to progress workloads to maintain intensity without signs/symptoms of physical distress.        Resistance Training          Training Prescription  Yes    Weight  5 lbs.     Reps  10-15           Perform Capillary Blood Glucose checks as needed.  Exercise Prescription Changes: Exercise Prescription Changes    Response to Exercise    Row Name 02/16/18 1200 02/22/18 1400 03/26/18 1000 04/23/18 0816   Blood Pressure (Admit)  122/60  114/72  122/80  120/80   Blood Pressure (Exercise)  168/72  152/64  128/74  160/70   Blood Pressure (Exit)  108/71  112/75  130/70  110/70   Heart Rate (Admit)  71 bpm  86 bpm  78 bpm  86 bpm   Heart Rate (Exercise)  140 bpm  151 bpm  139 bpm  157 bpm   Heart Rate (Exit)  77 bpm  88 bpm  88 bpm  96 bpm   Rating of Perceived Exertion (Exercise)  12  11  11  12    Comments  Pt first day of exercise  no documentation  no documentation  no  documentation   Duration  Progress to 30 minutes of  aerobic without signs/symptoms of physical distress  Progress to 30 minutes of  aerobic without signs/symptoms of physical distress  Progress to 30 minutes of  aerobic without signs/symptoms of physical distress  Progress to 30 minutes of  aerobic without signs/symptoms of physical distress   Intensity  THRR unchanged  THRR unchanged  THRR unchanged  THRR unchanged       Progression    Row Name 02/16/18 1200 02/22/18 1400 03/26/18 1000 04/23/18 0816   Progression  Continue to progress workloads to maintain intensity without signs/symptoms of physical distress.  Continue to progress workloads to maintain intensity without signs/symptoms of physical distress.  Continue to progress workloads to maintain intensity without signs/symptoms of physical distress.  Continue to progress workloads to maintain intensity without signs/symptoms of physical distress.   Average METs  5.5  6.9  7.8  7.8       Resistance Training    Row Name 02/16/18 1200 02/22/18 1400 03/26/18 1000 04/23/18 0816   Training Prescription  Yes  Yes  Yes  Yes   Weight  5 lbs.   6 lbs.   8 lbs.   8 lbs.    Reps  10-15  10-15  10-15  10-15   Time  10 Minutes  10 Minutes  10 Minutes  [place] Name 02/16/18 1200 02/22/18 1400 03/26/18 1000 04/23/18 0816   MPH  3.8  4  4  4    Grade  2  3  3  3    Minutes  10  10  10  10        Elliptical    Row Name 02/16/18 1200 02/22/18 1400 03/26/18 1000 04/23/18 0816   Level  2  2  2  2    Speed  2  2  2  2    Minutes  10  10  10  10        Rower    Row Name 02/16/18 1200 02/22/18 1400 03/26/18 1000 04/23/18 0816   Level  5  4  5  7    Watts  63  72  120  111   Minutes  10  10  10  10           Exercise Comments: Exercise Comments    Row Name 02/16/18 1225 02/22/18 1443 03/09/18 0946 03/28/18 1018 04/25/18 0817   Exercise Comments  Pt first day of exercise. Tolerated well.   Reviewed METs and goals with Pt.  Continues exercising at [employer] [employer].   Reviewed METs and goals with Pt. Continues exercising at [employer].   Reviewed METs and goals with Pt. Continues exercising at [employer].   Reviewed METs and goals with Pt. Continues exercising at [employer].       Exercise Goals and Review: Exercise Goals    Exercise Goals    Row Name 02/08/18 [phone]  Increase Physical Activity  Yes   Intervention  Provide advice, education, support and counseling about physical activity/exercise needs.;Develop an individualized exercise prescription for aerobic and resistive training based on initial evaluation findings, risk stratification, comorbidities and participant's personal goals.   Expected Outcomes  Short Term: Attend rehab on a regular basis to increase amount of physical activity.;Long Term: Add in home exercise to make exercise part of routine and to increase amount of physical activity.;Long Term: Exercising regularly at least 3-5 days a week.   Increase Strength and Stamina  Yes   Intervention  Provide advice, education, support and counseling about physical activity/exercise needs.;Develop an individualized exercise prescription for aerobic and resistive training based on initial evaluation findings, risk stratification, comorbidities and participant's personal goals.   Expected Outcomes  Short Term: Increase workloads from initial exercise prescription for resistance, speed, and METs.;Short Term: Perform resistance training exercises routinely during rehab and add in resistance training at home;Long Term: Improve cardiorespiratory fitness, muscular endurance and strength as measured by increased METs and functional capacity (6MWT)   Able to understand and use rate of perceived exertion (RPE) scale  Yes   Intervention  Provide education and explanation on how to use RPE scale   Expected Outcomes  Short Term: Able to use RPE daily in rehab to express subjective intensity level;Long Term:  Able to use RPE to guide  intensity level when exercising independently   Knowledge and understanding of Target Heart Rate Range (THRR)  Yes   Intervention  Provide education and explanation of THRR including how the numbers were predicted and where they are located for reference   Expected Outcomes  Short Term: Able to state/look up THRR;Long Term: Able to use THRR to govern intensity when exercising independently;Short Term: Able to use daily as guideline for intensity in rehab   Able to check pulse independently  Yes   Intervention  Provide education and demonstration on how to check pulse in carotid and radial arteries.;Review the importance of being able to check your own pulse for safety during independent exercise   Expected Outcomes  Short Term: Able to explain why pulse checking is important during independent exercise;Long Term: Able to check pulse independently and accurately   Understanding of Exercise Prescription  Yes   Intervention  Provide education, explanation, and written materials on patient's individual exercise prescription   Expected Outcomes  Short Term: Able to explain program exercise prescription;Long Term: Able to explain home exercise prescription to exercise independently          Exercise Goals Re-Evaluation : Exercise Goals Re-Evaluation    Exercise Goal Re-Evaluation    Row Name 02/16/18 1224 02/22/18 1442 03/28/18 1017 04/25/18 0817   Exercise Goals Review  Increase Physical Activity;Increase Strength and Stamina;Able to check pulse independently;Understanding of Exercise Prescription;Knowledge and understanding of Target Heart Rate Range (THRR);Able to understand and use rate of perceived exertion (RPE) scale  Increase Physical Activity;Increase Strength and Stamina;Able to check pulse independently;Understanding of Exercise Prescription;Knowledge and understanding of Target Heart Rate Range (THRR);Able to understand and use rate of perceived exertion (RPE) scale  Increase Physical  Activity;Increase Strength and Stamina;Able to check pulse independently;Understanding of Exercise Prescription;Knowledge and understanding of Target Heart Rate Range (THRR);Able to understand and use rate of perceived exertion (RPE) scale  no documentation   Comments  Pt first day of exercise. Pt tolerated exercise Rx well. Will increase workloads as tolerated.   Reviewed METs and goals with Pt. MET level is 6.9 and Pt is progressing  well. Continues at [facility] in addition to cardiac rehab.   Reviewed METs and goals with Pt. MET level is 7.8 and is progressing well. Continues at [facility] [employer] 2-3 days per week doing aerobic training in addition to cardiac rehab.   Reviewed METs and goals with Pt. MET level is 7.8 and is progressing well. Continues at [employer] 2-3 days per week doing aerobic training in addition to cardiac rehab.    Expected Outcomes  Will monitor and progress Pt as tolerated.   Will monitor and progress Pt as tolerated.   Will monitor and progress Pt as tolerated.   Will monitor and progress Pt as tolerated.           Discharge Exercise Prescription (Final Exercise Prescription Changes): Exercise Prescription Changes - 04/23/18 0816    Response to Exercise          Blood Pressure (Admit)  120/80    Blood Pressure (Exercise)  160/70    Blood Pressure (Exit)  110/70    Heart Rate (Admit)  86 bpm    Heart Rate (Exercise)  157 bpm    Heart Rate (Exit)  96 bpm    Rating of Perceived Exertion (Exercise)  12    Duration  Progress to 30 minutes of  aerobic without signs/symptoms of physical distress    Intensity  THRR unchanged        Progression          Progression  Continue to progress workloads to maintain intensity without signs/symptoms of physical distress.    Average METs  7.8        Resistance Training          Training Prescription  Yes    Weight  8 lbs.     Reps  10-15    Time  10 Minutes        Treadmill          MPH  4    Grade  3    Minutes  10         Elliptical          Level  2    Speed  2    Minutes  10        Rower          Level  7    Watts  111    Minutes  10           Nutrition:  Target Goals: Understanding of nutrition guidelines, daily intake of sodium <1545m, cholesterol <2033m calories 30% from fat and 7% or less from saturated fats, daily to have 5 or more servings of fruits and vegetables.  Biometrics: Pre Biometrics - 02/08/18 1157    Pre Biometrics          Height  5' 8"  (1.727 m)    Weight  80.8 kg    Waist Circumference  38.5 inches    Hip Circumference  41 inches    Waist to Hip Ratio  0.94 %    BMI (Calculated)  27.09    Triceps Skinfold  19 mm    % Body Fat  27.3 %    Grip Strength  39 kg    Flexibility  17.5 in    Single Leg Stand  30 seconds            Nutrition Therapy Plan and Nutrition Goals: Nutrition Therapy & Goals - 03/23/18 1040    Nutrition Therapy  Diet  heart healthy        Personal Nutrition Goals          Nutrition Goal  Pt to identify and limit food sources of saturated fat, trans fat, refined carbohydrates and sodium    Personal Goal #2  Pt to eat regularly across the day    Personal Goal #3  Pt to eat more of and a variety of non-starchy vegetables.    Personal Goal #4  Pt to look up nutrition information of restaurants before eating out        Intervention Plan          Intervention  Prescribe, educate and counsel regarding individualized specific dietary modifications aiming towards targeted core components such as weight, hypertension, lipid management, diabetes, heart failure and other comorbidities.    Expected Outcomes  Short Term Goal: Understand basic principles of dietary content, such as calories, fat, sodium, cholesterol and nutrients.;Long Term Goal: Adherence to prescribed nutrition plan.           Nutrition Assessments: Nutrition Assessments - 02/08/18 1610    MEDFICTS Scores          Pre Score  21           Nutrition Goals  Re-Evaluation:   Nutrition Goals Re-Evaluation:   Nutrition Goals Discharge (Final Nutrition Goals Re-Evaluation):   Psychosocial: Target Goals: Acknowledge presence or absence of significant depression and/or stress, maximize coping skills, provide positive support system. Participant is able to verbalize types and ability to use techniques and skills needed for reducing stress and depression.  Initial Review & Psychosocial Screening: Initial Psych Review & Screening - 02/08/18 1221    Initial Review          Current issues with  Current Stress Concerns    Source of Stress Concerns  Family;Occupation    Comments  Damon [name]'s mother has Alzheimer's. Damon [name] has a stressful job he spends a lot of time at work and travels [employer].        Family Dynamics          Good Support System?  Yes        Barriers          Psychosocial barriers to participate in program  The patient should benefit from training in stress management and relaxation.        Screening Interventions          Interventions  Encouraged to exercise    Expected Outcomes  Short Term goal: Utilizing psychosocial counselor, staff and physician to assist with identification of specific Stressors or current issues interfering with healing process. Setting desired goal for each stressor or current issue identified.;Long Term Goal: Stressors or current issues are controlled or eliminated.           Quality of Life Scores: Quality of Life - 02/08/18 1222    Quality of Life          Select  Quality of Life        Quality of Life Scores          Health/Function Pre  26.3 %    Socioeconomic Pre  27.43 %    Psych/Spiritual Pre  28.07 %    Family Pre  26.4 %    GLOBAL Pre  26.91 %          Scores of 19 and below usually indicate a poorer quality of life in these areas.  A difference of  2-3 points is a  clinically meaningful difference.  A difference of 2-3 points in the total score of the Quality of Life Index has been  associated with significant improvement in overall quality of life, self-image, physical symptoms, and general health in studies assessing change in quality of life.  PHQ-9: Recent Review Flowsheet Data    Depression screen [facility] 2/9 02/16/2018 03/11/2016 04/23/2015 07/16/2014   Decreased Interest 0 0 0 0   Down, Depressed, Hopeless 0 0 0 0   PHQ - 2 Score 0 0 0 0     Interpretation of Total Score  Total Score Depression Severity:  1-4 = Minimal depression, 5-9 = Mild depression, 10-14 = Moderate depression, 15-19 = Moderately severe depression, 20-27 = Severe depression   Psychosocial Evaluation and Intervention: Psychosocial Evaluation - 02/16/18 1425    Psychosocial Evaluation & Interventions          Interventions  Stress management education;Relaxation education;Encouraged to exercise with the program and follow exercise prescription    Comments  No psycosocial interventions necessary.    Expected Outcomes  Damon [name] will report ability to manage his familial stressors and maintain a positive outook.     Continue Psychosocial Services   No Follow up required           Psychosocial Re-Evaluation: Psychosocial Re-Evaluation    Psychosocial Re-Evaluation    Row Name 02/28/18 1609 03/28/18 1211 04/24/18 1320   Current issues with  Current Stress Concerns  Current Stress Concerns  Current Stress Concerns   Comments  pt with work related and family stress, otherwise no psychosocial needs identified, no interventions necessary   pt with work related and family stress, otherwise no psychosocial needs identified, no interventions necessary   pt with work related and family stress, otherwise no psychosocial needs identified, no interventions necessary    Expected Outcomes  pt will exhibit positive outlook with good coping skills.   pt will exhibit positive outlook with good coping skills.   pt will exhibit positive outlook with good coping skills.    Interventions  Stress management  education;Relaxation education;Encouraged to attend Cardiac Rehabilitation for the exercise  Stress management education;Relaxation education;Encouraged to attend Cardiac Rehabilitation for the exercise  Stress management education;Relaxation education;Encouraged to attend Cardiac Rehabilitation for the exercise   Continue Psychosocial Services   Follow up required by staff  Follow up required by staff  Follow up required by staff   Comments  Damon [name] mother has Alzheimer's. Damon [name] has a stressful job he spends a lot of time at work and travels [employer].  no documentation  Damon [name] mother has Alzheimer's. Chae has a stressful job he spends a lot of time at work and travels [employer].       Initial Review    Row Name 02/28/18 1609 03/28/18 1211 04/24/18 1320   Source of Stress Concerns  Family  Family  Family          Psychosocial Discharge (Final Psychosocial Re-Evaluation): Psychosocial Re-Evaluation - 04/24/18 1320    Psychosocial Re-Evaluation          Current issues with  Current Stress Concerns    Comments  pt with work related and family stress, otherwise no psychosocial needs identified, no interventions necessary     Expected Outcomes  pt will exhibit positive outlook with good coping skills.     Interventions  Stress management education;Relaxation education;Encouraged to attend Cardiac Rehabilitation for the exercise    Continue Psychosocial Services   Follow up required by staff    Comments  Damon [name] mother has Alzheimer's. Damon [name] has a stressful job he spends a lot of time at work and travels [employer].        Initial Review          Source of Stress Concerns  Family           Vocational Rehabilitation: Provide vocational rehab assistance to qualifying candidates.   Vocational Rehab Evaluation & Intervention: Vocational Rehab - 02/08/18 1223    Initial Vocational Rehab Evaluation & Intervention          Assessment shows need for Vocational Rehabilitation  No   Damon [name] does not need  vocational rehab at this time Simultaneous filing. User may not have seen previous data.          Education: Education Goals: Education classes will be provided on a weekly basis, covering required topics. Participant will state understanding/return demonstration of topics presented.  Learning Barriers/Preferences: Learning Barriers/Preferences - 02/08/18 1158    Learning Barriers/Preferences          Learning Barriers  Sight    Learning Preferences  Audio;Computer/Internet;Group Instruction;Individual Instruction;Pictoral;Skilled Demonstration;Verbal Instruction;Video;Written Material           Education Topics: Count Your Pulse:  -Group instruction provided by verbal instruction, demonstration, patient participation and written materials to support subject.  Instructors address importance of being able to find your pulse and how to count your pulse when at home without a heart monitor.  Patients get hands on experience counting their pulse with staff help and individually.   Heart Attack, Angina, and Risk Factor Modification:  -Group instruction provided by verbal instruction, video, and written materials to support subject.  Instructors address signs and symptoms of angina and heart attacks.    Also discuss risk factors for heart disease and how to make changes to improve heart health risk factors.   Functional Fitness:  -Group instruction provided by verbal instruction, demonstration, patient participation, and written materials to support subject.  Instructors address safety measures for doing things around the house.  Discuss how to get up and down off the floor, how to pick things up properly, how to safely get out of a chair without assistance, and balance training.   Meditation and Mindfulness:  -Group instruction provided by verbal instruction, patient participation, and written materials to support subject.  Instructor addresses importance of mindfulness and meditation  practice to help reduce stress and improve awareness.  Instructor also leads participants through a meditation exercise.    Stretching for Flexibility and Mobility:  -Group instruction provided by verbal instruction, patient participation, and written materials to support subject.  Instructors lead participants through series of stretches that are designed to increase flexibility thus improving mobility.  These stretches are additional exercise for major muscle groups that are typically performed during regular warm up and cool down.   Hands Only CPR:  -Group verbal, video, and participation provides a basic overview of AHA guidelines for community CPR. Role-play of emergencies allow participants the opportunity to practice calling for help and chest compression technique with discussion of AED use.   Hypertension: -Group verbal and written instruction that provides a basic overview of hypertension including the most recent diagnostic guidelines, risk factor reduction with self-care instructions and medication management.    Nutrition I class: Heart Healthy Eating:  -Group instruction provided by PowerPoint slides, verbal discussion, and written materials to support subject matter. The instructor gives an explanation and review of the Therapeutic Lifestyle Changes diet recommendations, which includes a discussion on  lipid goals, dietary fat, sodium, fiber, plant stanol/sterol esters, sugar, and the components of a well-balanced, healthy diet.   Nutrition II class: Lifestyle Skills:  -Group instruction provided by PowerPoint slides, verbal discussion, and written materials to support subject matter. The instructor gives an explanation and review of label reading, grocery shopping for heart health, heart healthy recipe modifications, and ways to make healthier choices when eating out.   Diabetes Question & Answer:  -Group instruction provided by PowerPoint slides, verbal discussion, and  written materials to support subject matter. The instructor gives an explanation and review of diabetes co-morbidities, pre- and post-prandial blood glucose goals, pre-exercise blood glucose goals, signs, symptoms, and treatment of hypoglycemia and hyperglycemia, and foot care basics.   Diabetes Blitz:  -Group instruction provided by PowerPoint slides, verbal discussion, and written materials to support subject matter. The instructor gives an explanation and review of the physiology behind type 1 and type 2 diabetes, diabetes medications and rational behind using different medications, pre- and post-prandial blood glucose recommendations and Hemoglobin A1c goals, diabetes diet, and exercise including blood glucose guidelines for exercising safely.    Portion Distortion:  -Group instruction provided by PowerPoint slides, verbal discussion, written materials, and food models to support subject matter. The instructor gives an explanation of serving size versus portion size, changes in portions sizes over the last 20 years, and what consists of a serving from each food group.   Stress Management:  -Group instruction provided by verbal instruction, video, and written materials to support subject matter.  Instructors review role of stress in heart disease and how to cope with stress positively.     Exercising on Your Own:  -Group instruction provided by verbal instruction, power point, and written materials to support subject.  Instructors discuss benefits of exercise, components of exercise, frequency and intensity of exercise, and end points for exercise.  Also discuss use of nitroglycerin and activating EMS.  Review options of places to exercise outside of rehab.  Review guidelines for sex with heart disease.   Cardiac Drugs I:  -Group instruction provided by verbal instruction and written materials to support subject.  Instructor reviews cardiac drug classes: antiplatelets, anticoagulants, beta  blockers, and statins.  Instructor discusses reasons, side effects, and lifestyle considerations for each drug class.   Cardiac Drugs II:  -Group instruction provided by verbal instruction and written materials to support subject.  Instructor reviews cardiac drug classes: angiotensin converting enzyme inhibitors (ACE-I), angiotensin II receptor blockers (ARBs), nitrates, and calcium channel blockers.  Instructor discusses reasons, side effects, and lifestyle considerations for each drug class.   Anatomy and Physiology of the Circulatory System:  Group verbal and written instruction and models provide basic cardiac anatomy and physiology, with the coronary electrical and arterial systems. Review of: AMI, Angina, Valve disease, Heart Failure, Peripheral Artery Disease, Cardiac Arrhythmia, Pacemakers, and the ICD.   Other Education:  -Group or individual verbal, written, or video instructions that support the educational goals of the cardiac rehab program.   Holiday Eating Survival Tips:  -Group instruction provided by PowerPoint slides, verbal discussion, and written materials to support subject matter. The instructor gives patients tips, tricks, and techniques to help them not only survive but enjoy the holidays despite the onslaught of food that accompanies the holidays.   Knowledge Questionnaire Score: Knowledge Questionnaire Score - 02/08/18 1201    Knowledge Questionnaire Score          Pre Score  16/24  Core Components/Risk Factors/Patient Goals at Admission: Personal Goals and Risk Factors at Admission - 02/08/18 1158    Core Components/Risk Factors/Patient Goals on Admission           Weight Management  Yes;Weight Maintenance;Weight Loss    Intervention  Weight Management: Develop a combined nutrition and exercise program designed to reach desired caloric intake, while maintaining appropriate intake of nutrient and fiber, sodium and fats, and appropriate energy  expenditure required for the weight goal.;Weight Management: Provide education and appropriate resources to help participant work on and attain dietary goals.;Weight Management/Obesity: Establish reasonable short term and long term weight goals.    Admit Weight  178 lb 2.1 oz (80.8 kg)    Expected Outcomes  Short Term: Continue to assess and modify interventions until short term weight is achieved;Long Term: Adherence to nutrition and physical activity/exercise program aimed toward attainment of established weight goal;Weight Maintenance: Understanding of the daily nutrition guidelines, which includes 25-35% calories from fat, 7% or less cal from saturated fats, less than 211m cholesterol, less than 1.5gm of sodium, & 5 or more servings of fruits and vegetables daily;Weight Loss: Understanding of general recommendations for a balanced deficit meal plan, which promotes 1-2 lb weight loss per week and includes a negative energy balance of [phone] kcal/d;Understanding recommendations for meals to include 15-35% energy as protein, 25-35% energy from fat, 35-60% energy from carbohydrates, less than 2032mof dietary cholesterol, 20-35 gm of total fiber daily;Understanding of distribution of calorie intake throughout the day with the consumption of 4-5 meals/snacks    Hypertension  Yes    Intervention  Provide education on lifestyle modifcations including regular physical activity/exercise, weight management, moderate sodium restriction and increased consumption of fresh fruit, vegetables, and low fat dairy, alcohol moderation, and smoking cessation.;Monitor prescription use compliance.    Expected Outcomes  Short Term: Continued assessment and intervention until BP is < 140/9072mG in hypertensive participants. < 130/27m67m in hypertensive participants with diabetes, heart failure or chronic kidney disease.;Long Term: Maintenance of blood pressure at goal levels.    Lipids  Yes    Intervention  Provide education  and support for participant on nutrition & aerobic/resistive exercise along with prescribed medications to achieve LDL <70mg49mL >40mg.8mExpected Outcomes  Short Term: Participant states understanding of desired cholesterol values and is compliant with medications prescribed. Participant is following exercise prescription and nutrition guidelines.;Long Term: Cholesterol controlled with medications as prescribed, with individualized exercise RX and with personalized nutrition plan. Value goals: LDL < 70mg, 61m> 40 mg.    Stress  Yes    Intervention  Offer individual and/or small group education and counseling on adjustment to heart disease, stress management and health-related lifestyle change. Teach and support self-help strategies.;Refer participants experiencing significant psychosocial distress to appropriate mental health specialists for further evaluation and treatment. When possible, include family members and significant others in education/counseling sessions.    Expected Outcomes  Short Term: Participant demonstrates changes in health-related behavior, relaxation and other stress management skills, ability to obtain effective social support, and compliance with psychotropic medications if prescribed.;Long Term: Emotional wellbeing is indicated by absence of clinically significant psychosocial distress or social isolation.           Core Components/Risk Factors/Patient Goals Review:  Goals and Risk Factor Review    Core Components/Risk Factors/Patient Goals Review    Row Name 02/16/18 1438 03/28/18 1211 04/24/18 1320   Personal Goals Review  Weight Management/Obesity;Lipids;Hypertension;Stress  Weight Management/Obesity;Lipids;Hypertension;Stress  Weight  Management/Obesity;Lipids;Hypertension;Stress   Review  Pt with multiple CAD RFs willing to participate in CR exercise.  Damon [name] would like to lose weight and keep his heart strong.   Pt with multiple CAD RFs willing to participate in CR  exercise.  Damon [name] would like to lose weight and keep his heart strong. pt reports he is discouraged about inability to lose weight thus far.  pt encouraged to continue weight loss efforts. pt over working on some equipment and instructed to back off for desired aerobic benefits.  understanding verbalized.    Pt with multiple CAD RFs willing to participate in CR exercise.  Damon [name] would like to lose weight and keep his heart strong. pt reports he is discouraged about inability to lose weight thus far.  pt encouraged to continue weight loss efforts. pt demonstrates improved tolerance of workloads and understanding of THR exercise guidelines.     Expected Outcomes  Pt will continue to participate in CR exercise, nutrition, and lifestyle modification.   Pt will continue to participate in CR exercise, nutrition, and lifestyle modification.   Pt will continue to participate in CR exercise, nutrition, and lifestyle modification.           Core Components/Risk Factors/Patient Goals at Discharge (Final Review):  Goals and Risk Factor Review - 04/24/18 1320    Core Components/Risk Factors/Patient Goals Review          Personal Goals Review  Weight Management/Obesity;Lipids;Hypertension;Stress    Review  Pt with multiple CAD RFs willing to participate in CR exercise.  Damon [name] would like to lose weight and keep his heart strong. pt reports he is discouraged about inability to lose weight thus far.  pt encouraged to continue weight loss efforts. pt demonstrates improved tolerance of workloads and understanding of THR exercise guidelines.      Expected Outcomes  Pt will continue to participate in CR exercise, nutrition, and lifestyle modification.            ITP Comments: ITP Comments    Row Name 02/08/18 1153 02/16/18 1420 03/29/18 1044 04/24/18 1319   ITP Comments  Dr. Fransico Him, Medical Director  30 Day ITP Review.  Pt started exercise today and tolerated it well.   30 Day ITP Review.  Pt with good  attendance and participation. pt demonstrates eagerness to participate in CR program. pt absences work related travel.   30 Day ITP Review.  Pt with good attendance and participation. pt demonstrates eagerness to participate in CR program.        Comments:

## 2018-04-27 ENCOUNTER — Encounter (HOSPITAL_COMMUNITY): Payer: BLUE CROSS/BLUE SHIELD

## 2018-04-30 ENCOUNTER — Encounter (HOSPITAL_COMMUNITY)
Admission: RE | Admit: 2018-04-30 | Discharge: 2018-04-30 | Disposition: A | Discharge status: Home / Self Care | Payer: BLUE CROSS/BLUE SHIELD | Source: Ambulatory Visit | Attending: Cardiovascular Disease | Admitting: Cardiovascular Disease

## 2018-04-30 ENCOUNTER — Encounter (HOSPITAL_COMMUNITY): Payer: BLUE CROSS/BLUE SHIELD

## 2018-04-30 DIAGNOSIS — Z955 Presence of coronary angioplasty implant and graft: Secondary | ICD-10-CM | POA: Diagnosis not present

## 2018-04-30 DIAGNOSIS — I214 Non-ST elevation (NSTEMI) myocardial infarction: Secondary | ICD-10-CM | POA: Diagnosis not present

## 2018-04-30 DIAGNOSIS — F4320 Adjustment disorder, unspecified: Secondary | ICD-10-CM | POA: Diagnosis not present

## 2018-04-30 DIAGNOSIS — F900 Attention-deficit hyperactivity disorder, predominantly inattentive type: Secondary | ICD-10-CM | POA: Diagnosis not present

## 2018-05-02 ENCOUNTER — Encounter (HOSPITAL_COMMUNITY): Payer: BLUE CROSS/BLUE SHIELD

## 2018-05-04 ENCOUNTER — Other Ambulatory Visit: Payer: Self-pay

## 2018-05-04 ENCOUNTER — Encounter (HOSPITAL_COMMUNITY): Payer: BLUE CROSS/BLUE SHIELD

## 2018-05-04 ENCOUNTER — Encounter (HOSPITAL_COMMUNITY)
Admission: RE | Admit: 2018-05-04 | Discharge: 2018-05-04 | Disposition: A | Discharge status: Home / Self Care | Payer: BLUE CROSS/BLUE SHIELD | Source: Ambulatory Visit | Attending: Cardiovascular Disease | Admitting: Cardiovascular Disease

## 2018-05-04 VITALS — Wt 175.3 lb

## 2018-05-04 DIAGNOSIS — I214 Non-ST elevation (NSTEMI) myocardial infarction: Secondary | ICD-10-CM | POA: Diagnosis not present

## 2018-05-04 DIAGNOSIS — Z955 Presence of coronary angioplasty implant and graft: Secondary | ICD-10-CM

## 2018-05-07 ENCOUNTER — Encounter (HOSPITAL_COMMUNITY): Payer: BLUE CROSS/BLUE SHIELD

## 2018-05-07 ENCOUNTER — Telehealth (HOSPITAL_COMMUNITY): Payer: Self-pay

## 2018-05-09 ENCOUNTER — Encounter (HOSPITAL_COMMUNITY): Payer: BLUE CROSS/BLUE SHIELD

## 2018-05-11 ENCOUNTER — Encounter (HOSPITAL_COMMUNITY): Payer: BLUE CROSS/BLUE SHIELD

## 2018-05-14 ENCOUNTER — Encounter (HOSPITAL_COMMUNITY): Payer: BLUE CROSS/BLUE SHIELD

## 2018-05-15 ENCOUNTER — Ambulatory Visit (HOSPITAL_COMMUNITY): Payer: Self-pay | Admitting: Cardiac Rehabilitation

## 2018-05-15 ENCOUNTER — Telehealth (HOSPITAL_COMMUNITY): Payer: Self-pay

## 2018-05-15 ENCOUNTER — Encounter (HOSPITAL_COMMUNITY): Payer: Self-pay | Admitting: Cardiac Rehabilitation

## 2018-05-15 DIAGNOSIS — I214 Non-ST elevation (NSTEMI) myocardial infarction: Secondary | ICD-10-CM

## 2018-05-15 DIAGNOSIS — Z955 Presence of coronary angioplasty implant and graft: Secondary | ICD-10-CM

## 2018-05-15 NOTE — Telephone Encounter (Signed)
Pt called and updated about extended closure of Cardiac Rehab for four weeks d/t COVID19. Tentative reopen date of 06/11/2018. Pt was responsive to the information. Pt was concerned about his heart rate while exercising trying to keep his heart rate within THRR. Encouraged Pt to keep heart arte while exercising under 146 and to check radial pulse while exercising often. Pt was also concerned about his co pay and the bill he received in the mail. Recommended Pt to contact billing of [facility] for more information.

## 2018-05-15 NOTE — Progress Notes (Signed)
Cardiac Individual Treatment Plan  Patient Details  Name: Damon [name] MRN: [id number] Date of Birth: 1960-06-18 Referring Provider:   Flowsheet Row CARDIAC REHAB PHASE II ORIENTATION from 02/08/2018 in [place]  Referring Provider  Dr. Sallyanne Kuster      Initial Encounter Date:  Flowsheet Row CARDIAC REHAB PHASE II ORIENTATION from 02/08/2018 in [place]  Date  02/08/18      Visit Diagnosis: Status post coronary artery stent placement 11/20/17 DES CFX  NSTEMI (non-ST elevated myocardial infarction) ([place]) 11/20/17  Patient's Home Medications on Admission:  Current Outpatient Medications:  .  aspirin EC 81 MG EC tablet, Take 1 tablet (81 mg total) by mouth daily., Disp: , Rfl:  .  atorvastatin (LIPITOR) 80 MG tablet, Take 1 tablet (80 mg total) by mouth daily at 6 PM., Disp: 90 tablet, Rfl: 2 .  carvedilol (COREG) 3.125 MG tablet, Take 1 tablet (3.125 mg total) by mouth 2 (two) times daily., Disp: 180 tablet, Rfl: 3 .  cetirizine (ZYRTEC) 10 MG tablet, Take 10 mg by mouth 2 (two) times daily as needed. , Disp: , Rfl:  .  clonazePAM (KLONOPIN) 0.5 MG tablet, Take 0.5-1 mg by mouth See admin instructions. Take 2 tablets in the morning and 1 tablet in the evening. May take 1 additional tablet as needed for anxiety, Disp: , Rfl:  .  lisdexamfetamine (VYVANSE) 30 MG capsule, Take 30 mg by mouth as needed. , Disp: , Rfl:  .  lisinopril (PRINIVIL,ZESTRIL) 20 MG tablet, Take 1 tablet (20 mg total) by mouth daily., Disp: 90 tablet, Rfl: 2 .  nitroGLYCERIN (NITROSTAT) 0.4 MG SL tablet, Place 1 tablet (0.4 mg total) under the tongue every 5 (five) minutes x 3 doses as needed for chest pain., Disp: 25 tablet, Rfl: 1 .  ticagrelor (BRILINTA) 90 MG TABS tablet, Take 1 tablet (90 mg total) by mouth 2 (two) times daily., Disp: 180 tablet, Rfl: 3  Past Medical History: Past Medical History:  Diagnosis Date  . Allergy    seasonal   .  Coronary artery disease   . Depression    situational   . Heart murmur    hx of in childhood   . Hyperlipidemia   . Hypertension   . Injury of tendon of long head of right biceps    January 2016    Tobacco Use: Social History   Tobacco Use  Smoking Status Former Smoker  . Packs/day: 1.50  . Years: 23.00  . Pack years: 34.50  . Types: Cigarettes  . Last attempt to quit: 02/22/1995  . Years since quitting: 23.2  Smokeless Tobacco Never Used    Labs: Recent Review Flowsheet Data    Labs for ITP Cardiac and Pulmonary Rehab Latest Ref Rng & Units 12/09/2015 12/28/2016 11/18/2017 12/19/2017 01/22/2018   Cholestrol 100 - 199 mg/dL 177 148 180 154 175   LDLCALC 0 - 99 mg/dL - 85 99 82 Comment   LDLDIRECT mg/dL 93.0 - - - -   HDL >39 mg/dL 39.40 38.20(L) 48 51.40 41   Trlycerides 0 - 149 mg/dL 365.0(H) 124.0 165(H) 105.0 444(H)      Capillary Blood Glucose: No results found for: GLUCAP   Exercise Target Goals: Exercise Program Goal: Individual exercise prescription set using results from initial 6 min walk test and THRR while considering  patient's activity barriers and safety.   Exercise Prescription Goal: Initial exercise prescription builds to 30-45 minutes a day of  aerobic activity, 2-3 days per week.  Home exercise guidelines will be given to patient during program as part of exercise prescription that the participant will acknowledge.  Activity Barriers & Risk Stratification: Activity Barriers & Cardiac Risk Stratification - 02/08/18 1154    Activity Barriers & Cardiac Risk Stratification          Activity Barriers  None    Cardiac Risk Stratification  High           6 Minute Walk: 6 Minute Walk    6 Minute Walk    Row Name 02/08/18 1153   Phase  Initial   Distance  2032 feet   Walk Time  6 minutes   # of Rest Breaks  0   MPH  3.8   METS  4.9   RPE  9   VO2 Peak  17.07   Symptoms  No   Resting HR  70 bpm   Resting BP  104/74   Resting Oxygen Saturation    97 %   Exercise Oxygen Saturation  during 6 min walk  99 %   Max Ex. HR  108 bpm   Max Ex. BP  122/78   2 Minute Post BP  108/72          Oxygen Initial Assessment:   Oxygen Re-Evaluation:   Oxygen Discharge (Final Oxygen Re-Evaluation):   Initial Exercise Prescription: Initial Exercise Prescription - 02/08/18 1100    Date of Initial Exercise RX and Referring Provider          Date  02/08/18    Referring Provider  Dr. Sallyanne Kuster    Expected Discharge Date  05/18/18        Treadmill          MPH  3.8    Grade  2    Minutes  10        Elliptical          Level  2    Speed  2    Minutes  10        Rower          Level  4    Watts  60    Minutes  10        Prescription Details          Frequency (times per week)  3    Duration  Progress to 30 minutes of continuous aerobic without signs/symptoms of physical distress        Intensity          THRR 40-80% of Max Heartrate  65-130    Ratings of Perceived Exertion  11-13        Progression          Progression  Continue to progress workloads to maintain intensity without signs/symptoms of physical distress.        Resistance Training          Training Prescription  Yes    Weight  5 lbs.     Reps  10-15           Perform Capillary Blood Glucose checks as needed.  Exercise Prescription Changes: Exercise Prescription Changes    Response to Exercise    Row Name 02/16/18 1200 02/22/18 1400 03/26/18 1000 04/23/18 0816 05/04/18 0852   Blood Pressure (Admit)  122/60  114/72  122/80  120/80  114/70   Blood Pressure (Exercise)  168/72  152/64  128/74  160/70  118/72   Blood  Pressure (Exit)  108/71  112/75  130/70  110/70  102/62   Heart Rate (Admit)  71 bpm  86 bpm  78 bpm  86 bpm  75 bpm   Heart Rate (Exercise)  140 bpm  151 bpm  139 bpm  157 bpm  140 bpm   Heart Rate (Exit)  77 bpm  88 bpm  88 bpm  96 bpm  85 bpm   Rating of Perceived Exertion (Exercise)  _0 Comments  Pt first  day of exercise  no documentation  no documentation  no documentation  no documentation   Duration  Progress to 30 minutes of  aerobic without signs/symptoms of physical distress  Progress to 30 minutes of  aerobic without signs/symptoms of physical distress  Progress to 30 minutes of  aerobic without signs/symptoms of physical distress  Progress to 30 minutes of  aerobic without signs/symptoms of physical distress  Progress to 30 minutes of  aerobic without signs/symptoms of physical distress   Intensity  THRR unchanged  THRR unchanged  THRR unchanged  THRR unchanged  THRR unchanged       Progression    Row Name 02/16/18 1200 02/22/18 1400 03/26/18 1000 04/23/18 0816 05/04/18 0852   Progression  Continue to progress workloads to maintain intensity without signs/symptoms of physical distress.  Continue to progress workloads to maintain intensity without signs/symptoms of physical distress.  Continue to progress workloads to maintain intensity without signs/symptoms of physical distress.  Continue to progress workloads to maintain intensity without signs/symptoms of physical distress.  Continue to progress workloads to maintain intensity without signs/symptoms of physical distress.   Average METs  5.5  6.9  7.8  7.8  8.5       Resistance Training    Row Name 02/16/18 1200 02/22/18 1400 03/26/18 1000 04/23/18 0816 05/04/18 0852   Training Prescription  Yes  Yes  Yes  Yes  Yes   Weight  5 lbs.   6 lbs.   8 lbs.   8 lbs.   8 lbs.    Reps  10-15  10-15  10-15  10-15  10-15   Time  10 Minutes  10 Minutes  10 Minutes  10 Minutes  [place] Name 02/16/18 1200 02/22/18 1400 03/26/18 1000 04/23/18 0816 05/04/18 0852   MPH  3._1 Grade  _2 Minutes  _3 Elliptical    Row Name 02/16/18 1200 02/22/18 1400 03/26/18 1000 04/23/18 0816 05/04/18 0852   Level  _4 Speed  _5 Minutes  _6 Rower    Row Name 02/16/18 1200 02/22/18 1400 03/26/18 1000 04/23/18 0816 05/04/18 0852   Level  _7 Watts  63  72  120  111  130   Minutes  _8 Exercise Comments: Exercise Comments    Row Name 02/16/18 1225 02/22/18 1443 03/09/18 0946  03/28/18 1018 04/25/18 0817   Exercise Comments  Pt first day of exercise. Tolerated well.   Reviewed METs and goals with Pt. Continues exercising at [facility] [employer].   Reviewed METs and goals with Pt. Continues exercising at [employer].   Reviewed METs and goals with Pt. Continues exercising at [facility].   Reviewed METs and goals with Pt. Continues exercising at [facility].    [place] Name 05/10/18 0859   Exercise Comments  Unable to review METs and goals with Pt due to closure for COVID19.       Exercise Goals and Review: Exercise Goals    Exercise Goals    Row Name 02/08/18 1156   Increase Physical Activity  Yes   Intervention  Provide advice, education, support and counseling about physical activity/exercise needs.;Develop an individualized exercise prescription for aerobic and resistive training based on initial evaluation findings, risk stratification, comorbidities and participant's personal goals.   Expected Outcomes  Short Term: Attend rehab on a regular basis to increase amount of physical activity.;Long Term: Add in home exercise to make exercise part of routine and to increase amount of physical activity.;Long Term: Exercising regularly at least 3-5 days a week.   Increase Strength and Stamina  Yes   Intervention  Provide advice, education, support and counseling about physical activity/exercise needs.;Develop an individualized exercise prescription for aerobic and resistive training based on initial evaluation findings, risk stratification, comorbidities and participant's personal goals.   Expected Outcomes  Short Term: Increase workloads from initial exercise prescription for resistance, speed, and METs.;Short Term:  Perform resistance training exercises routinely during rehab and add in resistance training at home;Long Term: Improve cardiorespiratory fitness, muscular endurance and strength as measured by increased METs and functional capacity (6MWT)   Able to understand and use rate of perceived exertion (RPE) scale  Yes   Intervention  Provide education and explanation on how to use RPE scale   Expected Outcomes  Short Term: Able to use RPE daily in rehab to express subjective intensity level;Long Term:  Able to use RPE to guide intensity level when exercising independently   Knowledge and understanding of Target Heart Rate Range (THRR)  Yes   Intervention  Provide education and explanation of THRR including how the numbers were predicted and where they are located for reference   Expected Outcomes  Short Term: Able to state/look up THRR;Long Term: Able to use THRR to govern intensity when exercising independently;Short Term: Able to use daily as guideline for intensity in rehab   Able to check pulse independently  Yes   Intervention  Provide education and demonstration on how to check pulse in carotid and radial arteries.;Review the importance of being able to check your own pulse for safety during independent exercise   Expected Outcomes  Short Term: Able to explain why pulse checking is important during independent exercise;Long Term: Able to check pulse independently and accurately   Understanding of Exercise Prescription  Yes   Intervention  Provide education, explanation, and written materials on patient's individual exercise prescription   Expected Outcomes  Short Term: Able to explain program exercise prescription;Long Term: Able to explain home exercise prescription to exercise independently          Exercise Goals Re-Evaluation : Exercise Goals Re-Evaluation    Exercise Goal Re-Evaluation    Row Name 02/16/18 1224 02/22/18 1442 03/28/18 1017 04/25/18 0817 05/10/18 0858   Exercise Goals Review   Increase Physical Activity;Increase Strength and Stamina;Able to check pulse independently;Understanding of Exercise  Prescription;Knowledge and understanding of Target Heart Rate Range (THRR);Able to understand and use rate of perceived exertion (RPE) scale  Increase Physical Activity;Increase Strength and Stamina;Able to check pulse independently;Understanding of Exercise Prescription;Knowledge and understanding of Target Heart Rate Range (THRR);Able to understand and use rate of perceived exertion (RPE) scale  Increase Physical Activity;Increase Strength and Stamina;Able to check pulse independently;Understanding of Exercise Prescription;Knowledge and understanding of Target Heart Rate Range (THRR);Able to understand and use rate of perceived exertion (RPE) scale  no documentation  Increase Physical Activity;Increase Strength and Stamina;Able to check pulse independently;Understanding of Exercise Prescription;Knowledge and understanding of Target Heart Rate Range (THRR);Able to understand and use rate of perceived exertion (RPE) scale   Comments  Pt first day of exercise. Pt tolerated exercise Rx well. Will increase workloads as tolerated.   Reviewed METs and goals with Pt. MET level is 6.9 and Pt is progressing well. Continues at [facility] in addition to cardiac rehab.   Reviewed METs and goals with Pt. MET level is 7.8 and is progressing well. Continues at [facility] 2-3 days per week doing aerobic training in addition to cardiac rehab.   Reviewed METs and goals with Pt. MET level is 7.8 and is progressing well. Continues at [facility] 2-3 days per week doing aerobic training in addition to cardiac rehab.   Unable to review METs and goals with Pt due to closure of Cardiac Rehab for [place]. Pt MET level is 8.5 and is progressing well. Pt is tolerating exercise Rx well.    Expected Outcomes  Will monitor and progress Pt as tolerated.   Will monitor and progress Pt as tolerated.   Will monitor and progress Pt as  tolerated.   Will monitor and progress Pt as tolerated.   Will monitor and progress Pt as tolerated.           Discharge Exercise Prescription (Final Exercise Prescription Changes): Exercise Prescription Changes - 05/04/18 0852    Response to Exercise          Blood Pressure (Admit)  114/70    Blood Pressure (Exercise)  118/72    Blood Pressure (Exit)  102/62    Heart Rate (Admit)  75 bpm    Heart Rate (Exercise)  140 bpm    Heart Rate (Exit)  85 bpm    Rating of Perceived Exertion (Exercise)  10    Duration  Progress to 30 minutes of  aerobic without signs/symptoms of physical distress    Intensity  THRR unchanged        Progression          Progression  Continue to progress workloads to maintain intensity without signs/symptoms of physical distress.    Average METs  8.5        Resistance Training          Training Prescription  Yes    Weight  8 lbs.     Reps  10-15    Time  10 Minutes        Treadmill          MPH  4    Grade  5    Minutes  10        Elliptical          Level  3    Speed  3    Minutes  10        Rower          Level  7    Watts  130    Minutes  10           Nutrition:  Target Goals: Understanding of nutrition guidelines, daily intake of sodium <1537m, cholesterol <2024m calories 30% from fat and 7% or less from saturated fats, daily to have 5 or more servings of fruits and vegetables.  Biometrics: Pre Biometrics - 02/08/18 1157    Pre Biometrics          Height  _0  (1.727 m)    Weight  178 lb 2.1 oz (80.8 kg)    Waist Circumference  38.5 inches    Hip Circumference  41 inches    Waist to Hip Ratio  0.94 %    BMI (Calculated)  27.09    Triceps Skinfold  19 mm    % Body Fat  27.3 %    Grip Strength  39 kg    Flexibility  17.5 in    Single Leg Stand  30 seconds            Nutrition Therapy Plan and Nutrition Goals: Nutrition Therapy & Goals - 03/23/18 1040    Nutrition Therapy          Diet  heart healthy         Personal Nutrition Goals          Nutrition Goal  Pt to identify and limit food sources of saturated fat, trans fat, refined carbohydrates and sodium    Personal Goal #2  Pt to eat regularly across the day    Personal Goal #3  Pt to eat more of and a variety of non-starchy vegetables.    Personal Goal #4  Pt to look up nutrition information of restaurants before eating out        Intervention Plan          Intervention  Prescribe, educate and counsel regarding individualized specific dietary modifications aiming towards targeted core components such as weight, hypertension, lipid management, diabetes, heart failure and other comorbidities.    Expected Outcomes  Short Term Goal: Understand basic principles of dietary content, such as calories, fat, sodium, cholesterol and nutrients.;Long Term Goal: Adherence to prescribed nutrition plan.           Nutrition Assessments: Nutrition Assessments - 02/08/18 1610    MEDFICTS Scores          Pre Score  21           Nutrition Goals Re-Evaluation:   Nutrition Goals Re-Evaluation:   Nutrition Goals Discharge (Final Nutrition Goals Re-Evaluation):   Psychosocial: Target Goals: Acknowledge presence or absence of significant depression and/or stress, maximize coping skills, provide positive support system. Participant is able to verbalize types and ability to use techniques and skills needed for reducing stress and depression.  Initial Review & Psychosocial Screening: Initial Psych Review & Screening - 02/08/18 1221    Initial Review          Current issues with  Current Stress Concerns    Source of Stress Concerns  Family;Occupation    Comments  Adoni's mother has Alzheimer's. 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  Yes        Barriers          Psychosocial barriers to participate in program  The patient should benefit from  training in stress  management and relaxation.        Screening Interventions          Interventions  Encouraged to exercise    Expected Outcomes  Short Term goal: Utilizing psychosocial counselor, staff and physician to assist with identification of specific Stressors or current issues interfering with healing process. Setting desired goal for each stressor or current issue identified.;Long Term Goal: Stressors or current issues are controlled or eliminated.           Quality of Life Scores: Quality of Life - 02/08/18 1222    Quality of Life          Select  Quality of Life        Quality of Life Scores          Health/Function Pre  26.3 %    Socioeconomic Pre  27.43 %    Psych/Spiritual Pre  28.07 %    Family Pre  26.4 %    GLOBAL Pre  26.91 %          Scores of 19 and below usually indicate a poorer quality of life in these areas.  A difference of  2-3 points is a clinically meaningful difference.  A difference of 2-3 points in the total score of the Quality of Life Index has been associated with significant improvement in overall quality of life, self-image, physical symptoms, and general health in studies assessing change in quality of life.  PHQ-9: Recent Review Flowsheet Data    Depression screen [facility] 2/9 02/16/2018 03/11/2016 04/23/2015 07/16/2014   Decreased Interest 0 0 0 0   Down, Depressed, Hopeless 0 0 0 0   PHQ - 2 Score 0 0 0 0     Interpretation of Total Score  Total Score Depression Severity:  1-4 = Minimal depression, 5-9 = Mild depression, 10-14 = Moderate depression, 15-19 = Moderately severe depression, 20-27 = Severe depression   Psychosocial Evaluation and Intervention: Psychosocial Evaluation - 02/16/18 1425    Psychosocial Evaluation & Interventions          Interventions  Stress management education;Relaxation education;Encouraged to exercise with the program and follow exercise prescription    Comments  No psycosocial interventions necessary.     Expected Outcomes  Toan will report ability to manage his familial stressors and maintain a positive outook.     Continue Psychosocial Services   No Follow up required           Psychosocial Re-Evaluation: Psychosocial Re-Evaluation    Psychosocial Re-Evaluation    Row Name 02/28/18 1609 03/28/18 1211 04/24/18 1320 05/09/18 1128   Current issues with  Current Stress Concerns  Current Stress Concerns  Current Stress Concerns  Current Stress Concerns   Comments  pt with work related and family stress, otherwise no psychosocial needs identified, no interventions necessary   pt with work related and family stress, otherwise no psychosocial needs identified, no interventions necessary   pt with work related and family stress, otherwise no psychosocial needs identified, no interventions necessary   pt with work related and family stress, otherwise no psychosocial needs identified, no interventions necessary    Expected Outcomes  pt will exhibit positive outlook with good coping skills.   pt will exhibit positive outlook with good coping skills.   pt will exhibit positive outlook with good coping skills.   pt will exhibit positive outlook with good coping skills.    Interventions  Stress management education;Relaxation education;Encouraged to attend Cardiac Rehabilitation for the exercise  Stress management education;Relaxation education;Encouraged to attend Cardiac Rehabilitation for the exercise  Stress management education;Relaxation education;Encouraged to attend Cardiac Rehabilitation for the exercise  Stress management education;Relaxation education;Encouraged to attend Cardiac Rehabilitation for the exercise   Continue Psychosocial Services   Follow up required by staff  Follow up required by staff  Follow up required by staff  Follow up required by staff   Comments  Conway's mother has Alzheimer's. 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  no documentation  Lovell's  mother has Alzheimer's. 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  Olie's mother has Alzheimer's. 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       Initial Review    Row Name 02/28/18 1609 03/28/18 1211 04/24/18 1320 05/09/18 1128   Source of Stress Concerns  Family  Family  Family  Family          Psychosocial Discharge (Final Psychosocial Re-Evaluation): Psychosocial Re-Evaluation - 05/09/18 1128    Psychosocial Re-Evaluation          Current issues with  Current Stress Concerns    Comments  pt with work related and family stress, otherwise no psychosocial needs identified, no interventions necessary     Expected Outcomes  pt will exhibit positive outlook with good coping skills.     Interventions  Stress management education;Relaxation education;Encouraged to attend Cardiac Rehabilitation for the exercise    Continue Psychosocial Services   Follow up required by staff    Comments  Kyson's mother has Alzheimer's. 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        Initial Review          Source of Stress Concerns  Family           Vocational Rehabilitation: Provide vocational rehab assistance to qualifying candidates.   Vocational Rehab Evaluation & Intervention: Vocational Rehab - 02/08/18 1223    Initial Vocational Rehab Evaluation & Intervention          Assessment shows need for Vocational Rehabilitation  No   Jaisen does not need vocational rehab at this time Simultaneous filing. User may not have seen previous data.          Education: Education Goals: Education classes will be provided on a weekly basis, covering required topics. Participant will state understanding/return demonstration of topics presented.  Learning Barriers/Preferences: Learning Barriers/Preferences - 02/08/18 1158    Learning Barriers/Preferences          Learning Barriers  Sight     Learning Preferences  Audio;Computer/Internet;Group Instruction;Individual Instruction;Pictoral;Skilled Demonstration;Verbal Instruction;Video;Written Material           Education Topics: Count Your Pulse:  -Group instruction provided by verbal instruction, demonstration, patient participation and written materials to support subject.  Instructors address importance of being able to find your pulse and how to count your pulse when at home without a heart monitor.  Patients get hands on experience counting their pulse with staff help and individually.   Heart Attack, Angina, and Risk Factor Modification:  -Group instruction provided by verbal instruction, video, and written materials to support subject.  Instructors address signs and symptoms of angina and heart attacks.    Also discuss risk factors for heart disease and how to make changes to improve heart health risk factors.   Functional  Fitness:  -Group instruction provided by verbal instruction, demonstration, patient participation, and written materials to support subject.  Instructors address safety measures for doing things around the house.  Discuss how to get up and down off the floor, how to pick things up properly, how to safely get out of a chair without assistance, and balance training.   Meditation and Mindfulness:  -Group instruction provided by verbal instruction, patient participation, and written materials to support subject.  Instructor addresses importance of mindfulness and meditation practice to help reduce stress and improve awareness.  Instructor also leads participants through a meditation exercise.    Stretching for Flexibility and Mobility:  -Group instruction provided by verbal instruction, patient participation, and written materials to support subject.  Instructors lead participants through series of stretches that are designed to increase flexibility thus improving mobility.  These stretches are additional  exercise for major muscle groups that are typically performed during regular warm up and cool down.   Hands Only CPR:  -Group verbal, video, and participation provides a basic overview of AHA guidelines for community CPR. Role-play of emergencies allow participants the opportunity to practice calling for help and chest compression technique with discussion of AED use.   Hypertension: -Group verbal and written instruction that provides a basic overview of hypertension including the most recent diagnostic guidelines, risk factor reduction with self-care instructions and medication management.    Nutrition I class: Heart Healthy Eating:  -Group instruction provided by PowerPoint slides, verbal discussion, and written materials to support subject matter. The instructor gives an explanation and review of the Therapeutic Lifestyle Changes diet recommendations, which includes a discussion on lipid goals, dietary fat, sodium, fiber, plant stanol/sterol esters, sugar, and the components of a well-balanced, healthy diet.   Nutrition II class: Lifestyle Skills:  -Group instruction provided by PowerPoint slides, verbal discussion, and written materials to support subject matter. The instructor gives an explanation and review of label reading, grocery shopping for heart health, heart healthy recipe modifications, and ways to make healthier choices when eating out.   Diabetes Question & Answer:  -Group instruction provided by PowerPoint slides, verbal discussion, and written materials to support subject matter. The instructor gives an explanation and review of diabetes co-morbidities, pre- and post-prandial blood glucose goals, pre-exercise blood glucose goals, signs, symptoms, and treatment of hypoglycemia and hyperglycemia, and foot care basics.   Diabetes Blitz:  -Group instruction provided by PowerPoint slides, verbal discussion, and written materials to support subject matter. The instructor gives an  explanation and review of the physiology behind type 1 and type 2 diabetes, diabetes medications and rational behind using different medications, pre- and post-prandial blood glucose recommendations and Hemoglobin A1c goals, diabetes diet, and exercise including blood glucose guidelines for exercising safely.    Portion Distortion:  -Group instruction provided by PowerPoint slides, verbal discussion, written materials, and food models to support subject matter. The instructor gives an explanation of serving size versus portion size, changes in portions sizes over the last 20 years, and what consists of a serving from each food group.   Stress Management:  -Group instruction provided by verbal instruction, video, and written materials to support subject matter.  Instructors review role of stress in heart disease and how to cope with stress positively.     Exercising on Your Own:  -Group instruction provided by verbal instruction, power point, and written materials to support subject.  Instructors discuss benefits of exercise, components of exercise, frequency and intensity of exercise, and end points for  exercise.  Also discuss use of nitroglycerin and activating EMS.  Review options of places to exercise outside of rehab.  Review guidelines for sex with heart disease.   Cardiac Drugs I:  -Group instruction provided by verbal instruction and written materials to support subject.  Instructor reviews cardiac drug classes: antiplatelets, anticoagulants, beta blockers, and statins.  Instructor discusses reasons, side effects, and lifestyle considerations for each drug class.   Cardiac Drugs II:  -Group instruction provided by verbal instruction and written materials to support subject.  Instructor reviews cardiac drug classes: angiotensin converting enzyme inhibitors (ACE-I), angiotensin II receptor blockers (ARBs), nitrates, and calcium channel blockers.  Instructor discusses reasons, side effects,  and lifestyle considerations for each drug class.   Anatomy and Physiology of the Circulatory System:  Group verbal and written instruction and models provide basic cardiac anatomy and physiology, with the coronary electrical and arterial systems. Review of: AMI, Angina, Valve disease, Heart Failure, Peripheral Artery Disease, Cardiac Arrhythmia, Pacemakers, and the ICD.   Other Education:  -Group or individual verbal, written, or video instructions that support the educational goals of the cardiac rehab program.   Holiday Eating Survival Tips:  -Group instruction provided by PowerPoint slides, verbal discussion, and written materials to support subject matter. The instructor gives patients tips, tricks, and techniques to help them not only survive but enjoy the holidays despite the onslaught of food that accompanies the holidays.   Knowledge Questionnaire Score: Knowledge Questionnaire Score - 02/08/18 1201    Knowledge Questionnaire Score          Pre Score  16/24           Core Components/Risk Factors/Patient Goals at Admission: Personal Goals and Risk Factors at Admission - 02/08/18 1158    Core Components/Risk Factors/Patient Goals on Admission           Weight Management  Yes;Weight Maintenance;Weight Loss    Intervention  Weight Management: Develop a combined nutrition and exercise program designed to reach desired caloric intake, while maintaining appropriate intake of nutrient and fiber, sodium and fats, and appropriate energy expenditure required for the weight goal.;Weight Management: Provide education and appropriate resources to help participant work on and attain dietary goals.;Weight Management/Obesity: Establish reasonable short term and long term weight goals.    Admit Weight  178 lb 2.1 oz (80.8 kg)    Expected Outcomes  Short Term: Continue to assess and modify interventions until short term weight is achieved;Long Term: Adherence to nutrition and physical  activity/exercise program aimed toward attainment of established weight goal;Weight Maintenance: Understanding of the daily nutrition guidelines, which includes 25-35% calories from fat, 7% or less cal from saturated fats, less than 274m cholesterol, less than 1.5gm of sodium, & 5 or more servings of fruits and vegetables daily;Weight Loss: Understanding of general recommendations for a balanced deficit meal plan, which promotes 1-2 lb weight loss per week and includes a negative energy balance of [phone] kcal/d;Understanding recommendations for meals to include 15-35% energy as protein, 25-35% energy from fat, 35-60% energy from carbohydrates, less than 2049mof dietary cholesterol, 20-35 gm of total fiber daily;Understanding of distribution of calorie intake throughout the day with the consumption of 4-5 meals/snacks    Hypertension  Yes    Intervention  Provide education on lifestyle modifcations including regular physical activity/exercise, weight management, moderate sodium restriction and increased consumption of fresh fruit, vegetables, and low fat dairy, alcohol moderation, and smoking cessation.;Monitor prescription use compliance.    Expected Outcomes  Short Term: Continued  assessment and intervention until BP is < 140/6m HG in hypertensive participants. < 130/831mHG in hypertensive participants with diabetes, heart failure or chronic kidney disease.;Long Term: Maintenance of blood pressure at goal levels.    Lipids  Yes    Intervention  Provide education and support for participant on nutrition & aerobic/resistive exercise along with prescribed medications to achieve LDL <707mHDL >10m68m  Expected Outcomes  Short Term: Participant states understanding of desired cholesterol values and is compliant with medications prescribed. Participant is following exercise prescription and nutrition guidelines.;Long Term: Cholesterol controlled with medications as prescribed, with individualized exercise  RX and with personalized nutrition plan. Value goals: LDL < 70mg55mL > 40 mg.    Stress  Yes    Intervention  Offer individual and/or small group education and counseling on adjustment to heart disease, stress management and health-related lifestyle change. Teach and support self-help strategies.;Refer participants experiencing significant psychosocial distress to appropriate mental health specialists for further evaluation and treatment. When possible, include family members and significant others in education/counseling sessions.    Expected Outcomes  Short Term: Participant demonstrates changes in health-related behavior, relaxation and other stress management skills, ability to obtain effective social support, and compliance with psychotropic medications if prescribed.;Long Term: Emotional wellbeing is indicated by absence of clinically significant psychosocial distress or social isolation.           Core Components/Risk Factors/Patient Goals Review:  Goals and Risk Factor Review    Core Components/Risk Factors/Patient Goals Review    Row Name 02/16/18 1438 03/28/18 1211 04/24/18 1320 05/09/18 1123   Personal Goals Review  Weight Management/Obesity;Lipids;Hypertension;Stress  Weight Management/Obesity;Lipids;Hypertension;Stress  Weight Management/Obesity;Lipids;Hypertension;Stress  Weight Management/Obesity;Lipids;Hypertension;Stress   Review  Pt with multiple CAD RFs willing to participate in CR exercise.  HarryLoyced like to lose weight and keep his heart strong.   Pt with multiple CAD RFs willing to participate in CR exercise.  HarryJamaard like to lose weight and keep his heart strong. pt reports he is discouraged about inability to lose weight thus far.  pt encouraged to continue weight loss efforts. pt over working on some equipment and instructed to back off for desired aerobic benefits.  understanding verbalized.    Pt with multiple CAD RFs willing to participate in CR exercise.  HarryDeclinld like to lose weight and keep his heart strong. pt reports he is discouraged about inability to lose weight thus far.  pt encouraged to continue weight loss efforts. pt demonstrates improved tolerance of workloads and understanding of THR exercise guidelines.    Pt with multiple CAD RFs willing to participate in CR exercise.  HarryPurcelld like to lose weight and keep his heart strong. pt demonstrates improved tolerance of workloads and understanding of THR exercise guidelines. pt currently on hold for departmental closing from Covid 19 precautions     Expected Outcomes  Pt will continue to participate in CR exercise, nutrition, and lifestyle modification.   Pt will continue to participate in CR exercise, nutrition, and lifestyle modification.   Pt will continue to participate in CR exercise, nutrition, and lifestyle modification.   Pt will continue to participate in CR exercise, nutrition, and lifestyle modification.           Core Components/Risk Factors/Patient Goals at Discharge (Final Review):  Goals and Risk Factor Review - 05/09/18 1123    Core Components/Risk Factors/Patient Goals Review          Personal Goals Review  Weight Management/Obesity;Lipids;Hypertension;Stress  Review  Pt with multiple CAD RFs willing to participate in CR exercise.  Jerin would like to lose weight and keep his heart strong. pt demonstrates improved tolerance of workloads and understanding of THR exercise guidelines. pt currently on hold for departmental closing from Covid 19 precautions      Expected Outcomes  Pt will continue to participate in CR exercise, nutrition, and lifestyle modification.            ITP Comments: ITP Comments    Row Name 02/08/18 1153 02/16/18 1420 03/29/18 1044 04/24/18 1319 05/09/18 1121   ITP Comments  Dr. Fransico Him, Medical Director  30 Day ITP Review.  Pt started exercise today and tolerated it well.   30 Day ITP Review.  Pt with good attendance and participation. pt  demonstrates eagerness to participate in CR program. pt absences work related travel.   30 Day ITP Review.  Pt with good attendance and participation. pt demonstrates eagerness to participate in CR program.    30 Day ITP Review.  Pt with average attendance and participation. pt demonstrates eagerness to participate in CR program. pt absences due to work related travel and family obligations.     [place] Name 05/15/18 1513   ITP Comments  pt currently on hold for CR dept closure as part of COVID 19 precautions.       Comments:  Andi Hence, RN, BSN Cardiac Pulmonary Rehab

## 2018-05-16 ENCOUNTER — Encounter (HOSPITAL_COMMUNITY): Payer: BLUE CROSS/BLUE SHIELD

## 2018-05-28 DIAGNOSIS — F4320 Adjustment disorder, unspecified: Secondary | ICD-10-CM | POA: Diagnosis not present

## 2018-05-28 DIAGNOSIS — F900 Attention-deficit hyperactivity disorder, predominantly inattentive type: Secondary | ICD-10-CM | POA: Diagnosis not present

## 2018-05-30 ENCOUNTER — Telehealth (HOSPITAL_COMMUNITY): Payer: Self-pay | Admitting: Cardiac Rehabilitation

## 2018-05-30 NOTE — Telephone Encounter (Signed)
Pt phone call to inform of continued Outpatient Cardiac Rehab departmental closure for COVID 19 precautions. Future opening date to be determined.  Pt instructed to continue exercising on his own following home exercise guidelines. Pt advised to contact cardiology or PCP PRN symptoms, questions or concerns. Understanding verbalized.   Pt denies food insecurity or other needs at this time.  Pt offered emotional support and understanding. Pt expressed gratitude for the  call and is interested in resuming  CRPII when available.  Winton Offord, RN, BSN Cardiac Pulmonary Rehab   

## 2018-06-06 ENCOUNTER — Telehealth: Payer: Self-pay | Admitting: Cardiovascular Disease

## 2018-06-06 DIAGNOSIS — N400 Enlarged prostate without lower urinary tract symptoms: Secondary | ICD-10-CM

## 2018-06-06 NOTE — Telephone Encounter (Signed)
-   It's perfectly fine to add PSA to his lipid profile. Please go ahead - The plan is to continue dual antiplatelet therapy until November 21, 2018, but at any point starting this month he can temporarily stop Brilinta, for 5 days before the biopsy procedure. He would then restart the Brilinta, through September. We have plenty of time to make sure we iron out those details by the time of his visit with Dr. Sande Brothers. - I know that his rehab was cut short, as were many treatments that required face-to-face interaction. He should apply what he learned in cardiac rehab and try to do the exercises himself. I do not know when cardiac rehab will reopen and I doubt that restarting rehab would be of much benefit  (when they open it will be at least 7-8 months after his acute event) .  - As far as the billing issues are concerned, I have no knowledge or control over that. Forwarding him to billing was the appropriate thing to do. Thank you for taking so much time with him, MCr

## 2018-06-06 NOTE — Telephone Encounter (Signed)
New message:   Patient calling concering his appt. Patient need to speak with some one today. Patient has some issues. Please would like for some one to look at his chart.

## 2018-06-06 NOTE — Telephone Encounter (Signed)
Spoke with the pt for 40 mins today and he is asking if he can also get a PCA when having his lipids drawn in May. He has been seeing Dr. Sande Brothers regarding his prostate and potential biopsy, but he may get a second opinion with Dr. Earlene Plater...he was advised that he may need to go off his Brilinta for 30 days prior to the biopsy and 30 days after.Marland KitchenMarland KitchenI advised him that Dr. Royann Shivers noted at his last visit that we can re-address holding the med starting April 2020, but I am unsure if that amount of time is appropriate cardiac wise. I strongly urged pt to have that physician that he chooses to do the biopsy to get in contact with our office prior to scheduling to address this issue. Pt also wants Dr. Royann Shivers to know that his cardiac rehab was cut short due to COVID19 and he was started late because he was on a wait list. This has really upset him and was wondering if he needs to continue with cardiac rehab even though his future sessions have been cancelled.  Pt also very upset regarding billing for cardiac rehab that was done after his insurance reset January 1st, 2020. He was expecting he would not have had to meet his deductible again since all of this had started in 2019. Advised him I will forward his message to the billing department for advice.   His appointment with Dr. Sande Brothers is May 18th, 2020 if he decides to stay with him, he is not sure if the biopsy is being done that date or not. I advised him to please contact Dr. Sande Brothers because that would not be enough time to manage his Brilinta.   Pt is asking for all of this to be sent to Dr. Royann Shivers for his advice and possibly a phone call back.

## 2018-06-07 NOTE — Telephone Encounter (Addendum)
LMTCB re: Dr. Erin Hearing message.   Per Aram Beecham... Pt can call [facility] re: his cardiac rehab billing at [phone]

## 2018-06-07 NOTE — Telephone Encounter (Addendum)
Spoke with the pt and he agreed with Dr. Erin Hearing response to his message... I gave him the number for [facility] and I will forward Dr. Renaye Rakers message to Dr. Alphonsa Overall for the TBD prostate biopsy possibly in May per pt request... PSA added to pt Lipid panel.   Chart sent to Dr. Liliane Shi with [facility] and pts PMD.. Dr. Caryl Never.

## 2018-06-07 NOTE — Telephone Encounter (Signed)
Follow  Up ° ° ° ° °Pt is returning call ° ° ° °Please call back  °

## 2018-06-11 DIAGNOSIS — F4320 Adjustment disorder, unspecified: Secondary | ICD-10-CM | POA: Diagnosis not present

## 2018-06-11 DIAGNOSIS — F900 Attention-deficit hyperactivity disorder, predominantly inattentive type: Secondary | ICD-10-CM | POA: Diagnosis not present

## 2018-06-21 ENCOUNTER — Ambulatory Visit (INDEPENDENT_AMBULATORY_CARE_PROVIDER_SITE_OTHER): Payer: BLUE CROSS/BLUE SHIELD | Admitting: Sports Medicine

## 2018-06-21 ENCOUNTER — Encounter: Payer: Self-pay | Admitting: Sports Medicine

## 2018-06-21 ENCOUNTER — Other Ambulatory Visit: Payer: Self-pay

## 2018-06-21 VITALS — BP 136/80 | Ht 68.0 in | Wt 175.0 lb

## 2018-06-21 DIAGNOSIS — M25512 Pain in left shoulder: Secondary | ICD-10-CM

## 2018-06-21 NOTE — Patient Instructions (Signed)
You have a mild biceps tendinitis Do the home exercises for strengthening once daily weeks 5 days/week You can take Tylenol as needed for pain Ice 15 minutes 3-4 times per day Some topical medications may be helpful such as Biofreeze or Aspercreme We can consider steroid injection if you are not getting better Follow-up in 6 weeks

## 2018-06-21 NOTE — Progress Notes (Signed)
  Damon [name] - 58 y.o. male MRN [id number]  Date of birth: 06/25/1960    SUBJECTIVE:      Chief Complaint: left shoulder pain  HPI:  58 year old male with approximately 1.5 months of anterior lateral left shoulder pain.  Reports 3-4/10 pain.  He believes initial injury happened while he was taking out a heavy trash can.  He was dragging it behind him with his arm extended and felt the slight pain in the anterior part of the shoulder.  He also believes it may be possible that he had a low-grade injury that occurred during cardiac rehab.  His pain is worse with extension of the shoulder and reaching behind him.  Improved with rest.  He is taking Tylenol once with minimal benefit.  He denies any swelling or bruising.  No numbness or tingling.  No skin changes   ROS:     See HPI. All other reviewed systems negative.  PERTINENT  PMH / PSH FH / / SH:  Past Medical, Surgical, Social, and Family History Reviewed & Updated in the EMR.  Pertinent findings include:  Recent history of MI  OBJECTIVE: BP 136/80   Ht 5\' 8"  (1.727 m)   Wt 175 lb (79.4 kg)   BMI 26.61 kg/m   Physical Exam:  Vital signs are reviewed.  GEN: Alert and oriented, NAD Pulm: Breathing unlabored PSY: normal mood, congruent affect  MSK: Left shoulder: No obvious deformity or asymmetry. No bruising. No swelling Tenderness over the proximal biceps tendon Full ROM in flexion, abduction, internal/external rotation NV intact distally Special Tests:  - Impingement: Neg Hawkins and Neers.  - Supraspinatus: Negative empty can.  5/5 strength - Infraspinatus/Teres: 5/5 strength with ER - Subscapularis:  5/5 strength with IR - Biceps tendon: Positive speeds.  Negative Yergason's  MSK [place]: [place] left shoulder  BT short: normal appearance, no tears.  Mild fluid noted within the tendon sheath BT long: normal appearance, intact without tears.  Again bilateral fluid noted Supraspinatus tendon:  No evidence of tears.  There is some  slight degenerative change seen within the tendon. Subscapularis tendon:  No tears, mild degenerative changes Infraspinatus tendon:  normal appearance without tears or degenerative changes Teres Minor tendon:  normal appearance without tears or degenerative changes GH joint: normal appearing posterior labrum AC joint:  Mild degenerative changes, no geyser sign  Summary and Additional findings-mild biceps tenosynovitis   Right shoulder:  No obvious shoulder deformity.  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  Left shoulder pain secondary to biceps tendinitis.  Despite his mild pain he has not been functionally limited. - Patient started on home rehab exercises - May use Tylenol as needed - If pain worsens or is not improving, consider steroid injection -he will follow-up in 6 weeks  I was the preceptor for this visit and available for immediate consultation Marsa Aris, DO

## 2018-06-25 DIAGNOSIS — F4320 Adjustment disorder, unspecified: Secondary | ICD-10-CM | POA: Diagnosis not present

## 2018-06-25 DIAGNOSIS — F900 Attention-deficit hyperactivity disorder, predominantly inattentive type: Secondary | ICD-10-CM | POA: Diagnosis not present

## 2018-06-26 DIAGNOSIS — F411 Generalized anxiety disorder: Secondary | ICD-10-CM | POA: Diagnosis not present

## 2018-07-02 ENCOUNTER — Other Ambulatory Visit: Payer: Self-pay

## 2018-07-02 DIAGNOSIS — R350 Frequency of micturition: Secondary | ICD-10-CM | POA: Diagnosis not present

## 2018-07-02 DIAGNOSIS — N400 Enlarged prostate without lower urinary tract symptoms: Secondary | ICD-10-CM | POA: Diagnosis not present

## 2018-07-02 DIAGNOSIS — E785 Hyperlipidemia, unspecified: Secondary | ICD-10-CM | POA: Diagnosis not present

## 2018-07-02 DIAGNOSIS — N401 Enlarged prostate with lower urinary tract symptoms: Secondary | ICD-10-CM | POA: Diagnosis not present

## 2018-07-02 LAB — PSA
PSA: 4.06
Prostate Specific Ag, Serum: 4.1 ng/mL — ABNORMAL HIGH (ref 0.0–4.0)

## 2018-07-02 LAB — LIPID PANEL
Chol/HDL Ratio: 3.6 ratio (ref 0.0–5.0)
Cholesterol, Total: 153 mg/dL (ref 100–199)
HDL: 42 mg/dL (ref >39)
LDL Calculated: 87 mg/dL (ref 0–99)
Triglycerides: 118 mg/dL (ref 0–149)
VLDL Cholesterol Cal: 24 mg/dL (ref 5–40)

## 2018-07-03 ENCOUNTER — Telehealth: Payer: Self-pay | Admitting: *Deleted

## 2018-07-03 DIAGNOSIS — E785 Hyperlipidemia, unspecified: Secondary | ICD-10-CM

## 2018-07-03 MED ORDER — EZETIMIBE 10 MG PO TABS: 10.0000 mg | ORAL_TABLET | Freq: Every day | ORAL | 11 refills | Status: DC

## 2018-07-03 NOTE — Telephone Encounter (Signed)
Patient made aware of results and verbalized understanding.  Repeat lab orders have been placed and Zetia 10 mg has been sent to [facility].

## 2018-07-03 NOTE — Telephone Encounter (Signed)
-----   Message from Thurmon Fair, MD sent at 07/02/2018  4:10 PM EDT ----- Triglycerides are excellent but the LDL cholesterol is still not at our target of 70 or less. This is despite the highest dose of atorvastatin.  I would recommend adding ezetimibe 10 mg once daily and recheck labs in 3 months.

## 2018-07-09 ENCOUNTER — Telehealth: Payer: Self-pay

## 2018-07-09 DIAGNOSIS — N401 Enlarged prostate with lower urinary tract symptoms: Secondary | ICD-10-CM | POA: Diagnosis not present

## 2018-07-09 DIAGNOSIS — R350 Frequency of micturition: Secondary | ICD-10-CM | POA: Diagnosis not present

## 2018-07-09 DIAGNOSIS — R972 Elevated prostate specific antigen [PSA]: Secondary | ICD-10-CM | POA: Diagnosis not present

## 2018-07-09 NOTE — Telephone Encounter (Signed)
Dr. Royann Shivers -  Last PCI 10/2017. Your last note suggests possibly holding DAPT for prostate biopsy. This has been formally requested by [facility]. Please provide guidance on holding brilinta.

## 2018-07-09 NOTE — Telephone Encounter (Signed)
   [facility] Pre-operative Risk Assessment    Request for surgical clearance:  1. What type of surgery is being performed? Prostate Biopsy    2. When is this surgery scheduled? TBD   3. What type of clearance is required (medical clearance vs. Pharmacy clearance to hold med vs. Both)? Pharmacy   4. Are there any medications that need to be held prior to surgery and how long? Brilinta   5. Practice name and name of physician performing surgery? [facility] (Dr. Ellison Hughs)   6. What is your office phone number [phone]    7.   What is your office fax number [phone]  8.   Anesthesia type (None, local, MAC, general) ?  Unknown    Meryl Crutch 07/09/2018, 3:12 PM  _________________________________________________________________   (provider comments below)

## 2018-07-10 NOTE — Telephone Encounter (Signed)
   Primary Cardiologist: Thurmon Fair, MD  Chart reviewed as part of pre-operative protocol coverage. Patient was contacted 07/10/2018 in reference to pre-operative risk assessment for pending surgery as outlined below.  Damon [name] was last seen on 02/26/18 by Dr. Royann [name].  Since that day, Damon [name] has done well.  Last PCI was 10/2017. He denies any new or worsening cardiac symptoms. He does experience SOB, which is a known side effect of the brilinta. This is not new or worsening. He can complete 4.0 METS.  Per Dr. Royann [name]: He can hold Brilinta for 5 days before prostate biopsy.   Therefore, based on ACC/AHA guidelines, the patient would be at acceptable risk for the planned procedure without further cardiovascular testing.   I will route this recommendation to the requesting party via Epic fax function and remove from pre-op pool.  Please call with questions.  Damon Rutherford TRUE Garciamartinez, PA 07/10/2018, 8:47 AM

## 2018-07-10 NOTE — Telephone Encounter (Signed)
He can hold Brilinta for 5 days before prostate biopsy.  Plan to DC the Brilinta altogether in September.

## 2018-07-17 ENCOUNTER — Telehealth (HOSPITAL_COMMUNITY): Payer: Self-pay

## 2018-07-17 NOTE — Telephone Encounter (Signed)
Pt returned phone call regarding virtual cardiac rehab. Pt was responsive to information and wants to move forward with virtual rehab. Pt was informed a text message would be sent to his phone to set up application.

## 2018-07-17 NOTE — Telephone Encounter (Signed)
Phone call made to Pt to provide information about virtual rehab. Pt did not answer. Voicemail was made to return call.

## 2018-07-18 ENCOUNTER — Encounter (HOSPITAL_COMMUNITY): Payer: Self-pay

## 2018-07-18 NOTE — Progress Notes (Signed)
Transitioning to virtual cardiac rehab   Dr.  Royann Shivers   As you are aware our department remains closed to patients due to Covid-19.  We are excited to be able to offer an alternative to traditional onsite Cardiac Rehab while your patient continues to follow Re-Open guidelines.  This is a notification that your patient has been contacted and is very interested in participating in Virtual Cardiac Rehab.  Thank you for your continued support in helping [place] meet the health care needs of our patients.  Tempie Donning. Support Rep II   Cardiac Rehab staff

## 2018-07-23 ENCOUNTER — Telehealth (HOSPITAL_COMMUNITY): Payer: Self-pay

## 2018-07-23 NOTE — Telephone Encounter (Signed)
Attempted to contact pt to schedule a telephone visit for our Virtual Cardiac Rehab, LMTCB. °

## 2018-07-24 DIAGNOSIS — F900 Attention-deficit hyperactivity disorder, predominantly inattentive type: Secondary | ICD-10-CM | POA: Diagnosis not present

## 2018-07-24 DIAGNOSIS — F4320 Adjustment disorder, unspecified: Secondary | ICD-10-CM | POA: Diagnosis not present

## 2018-07-25 ENCOUNTER — Telehealth (HOSPITAL_COMMUNITY): Payer: Self-pay | Admitting: *Deleted

## 2018-07-25 ENCOUNTER — Telehealth (HOSPITAL_COMMUNITY): Payer: Self-pay

## 2018-07-25 ENCOUNTER — Encounter (HOSPITAL_COMMUNITY)
Admission: RE | Admit: 2018-07-25 | Discharge: 2018-07-25 | Disposition: A | Discharge status: Home / Self Care | Payer: Self-pay | Source: Ambulatory Visit | Attending: Cardiovascular Disease | Admitting: Cardiovascular Disease

## 2018-07-25 DIAGNOSIS — I2510 Atherosclerotic heart disease of native coronary artery without angina pectoris: Secondary | ICD-10-CM | POA: Insufficient documentation

## 2018-07-25 DIAGNOSIS — Z955 Presence of coronary angioplasty implant and graft: Secondary | ICD-10-CM | POA: Insufficient documentation

## 2018-07-25 DIAGNOSIS — I214 Non-ST elevation (NSTEMI) myocardial infarction: Secondary | ICD-10-CM | POA: Insufficient documentation

## 2018-07-25 NOTE — Telephone Encounter (Signed)
Called and spoke to pt regarding Virtual Cardiac Rehab.  Pt  was able to download the [facility] [employer] app on their smart device with no issues. Pt set up their account and received the following welcome message -"Welcome to the [facility] program. We hope that you will find the exercise program beneficial in your recovery process. Our staff is available to assist with any questions/concerns about your exercise routine. Best wishes". Brief orientation provided to with the advisement to watch the "Intro to Rehab" series located under the Resource tab. Pt verbalized understanding. Will continue to follow and monitor pt progress with feedback as needed. 

## 2018-07-25 NOTE — Telephone Encounter (Signed)
Pt left message on voicemail regarding virtual cardiac rehab.  Called and spoke to pt.  Pt wishes to proceed.  See telephone consent:           Confirm Consent - In the setting of the current Covid19 crisis, you are scheduled for a phone visit with your Cardiac or Pulmonary team member.  Just as we do with many in-gym visits, in order for you to participate in this visit, we must obtain consent.  If you'd like, I can send this to your mychart (if signed up) or email for you to review.  Otherwise, I can obtain your verbal consent now.  By agreeing to a telephone visit, we'd like you to understand that the technology does not allow for your Cardiac or Pulmonary Rehab team member to perform a physical assessment, and thus may limit their ability to fully assess your ability to perform exercise programs. If your provider identifies any concerns that need to be evaluated in person, we will make arrangements to do so.  Finally, though the technology is pretty good, we cannot assure that it will always work on either your or our end and we cannot ensure that we have a secure connection.  Cardiac and Pulmonary Rehab Telehealth visits and "At Home" cardiac and pulmonary rehab are provided at no cost to you.               Are you willing to proceed?" STAFF: Did the patient verbally acknowledge consent to telehealth visit? Document YES/NO here: YES       Karlene Lineman RN, BSN Cardiac and Pulmonary Rehab Nurse Navigator    Cardiac and Pulmonary Rehab Staff               07/25/2018 @ 1224

## 2018-07-25 NOTE — Telephone Encounter (Signed)
Phone call made to Pt to set up virtual cardiac rehab application. Pt was able to do so and is now active in the app.

## 2018-07-26 DIAGNOSIS — F4320 Adjustment disorder, unspecified: Secondary | ICD-10-CM | POA: Diagnosis not present

## 2018-07-26 DIAGNOSIS — F909 Attention-deficit hyperactivity disorder, unspecified type: Secondary | ICD-10-CM | POA: Diagnosis not present

## 2018-07-30 ENCOUNTER — Telehealth: Payer: Self-pay | Admitting: Cardiovascular Disease

## 2018-07-30 NOTE — Telephone Encounter (Signed)
New Message:          Please call, pt have concerns about his medicine he is on and the procedure he is having.  He also would like to know if he can have a sooner appt than 08-28-18?i

## 2018-07-30 NOTE — Telephone Encounter (Signed)
08/16/18 prostate biopsy is scheduled Patients just wants to make sure with Dr Sallyanne Kuster he does not need to hold his ASA prior. Patient aware to hold Brilinta 5 days prior. Will forward to Dr Sallyanne Kuster for review

## 2018-07-31 NOTE — Telephone Encounter (Signed)
Whether or not he should hold aspirin is up to Dr. Lovena Neighbours, his Urologist.  From my point of view, ASA is not mandatory, but it would be preferable that he stay on it.

## 2018-07-31 NOTE — Telephone Encounter (Signed)
Advised patient and sent to Dr Gilford Rile as requested by patient

## 2018-08-03 DIAGNOSIS — R972 Elevated prostate specific antigen [PSA]: Secondary | ICD-10-CM | POA: Diagnosis not present

## 2018-08-03 DIAGNOSIS — N138 Other obstructive and reflux uropathy: Secondary | ICD-10-CM | POA: Diagnosis not present

## 2018-08-03 DIAGNOSIS — N401 Enlarged prostate with lower urinary tract symptoms: Secondary | ICD-10-CM | POA: Diagnosis not present

## 2018-08-06 ENCOUNTER — Telehealth: Payer: Self-pay | Admitting: Family Medicine

## 2018-08-06 NOTE — Telephone Encounter (Signed)
Copied from [place] [id number]. Topic: Quick Communication - See Telephone Encounter >> Aug 06, 2018  8:26 AM Robina Ade, Helene Kelp D wrote: CRM for notification. See Telephone encounter for: 08/06/18. Patient called and would like to talk to Dr. Elease Hashimoto CMA about his urine lab. Please call patient back, thanks.

## 2018-08-07 NOTE — Telephone Encounter (Signed)
Called patient and LMOVM to return call  Ok for PEC to Discuss results / PCP / recommendations / Schedule patient  CRM Created. 

## 2018-08-10 DIAGNOSIS — F900 Attention-deficit hyperactivity disorder, predominantly inattentive type: Secondary | ICD-10-CM | POA: Diagnosis not present

## 2018-08-10 DIAGNOSIS — F4320 Adjustment disorder, unspecified: Secondary | ICD-10-CM | POA: Diagnosis not present

## 2018-08-17 ENCOUNTER — Ambulatory Visit (INDEPENDENT_AMBULATORY_CARE_PROVIDER_SITE_OTHER): Payer: BC Managed Care – PPO | Admitting: Family Medicine

## 2018-08-17 ENCOUNTER — Other Ambulatory Visit: Payer: Self-pay

## 2018-08-17 VITALS — BP 122/78 | Ht 69.0 in | Wt 177.0 lb

## 2018-08-17 DIAGNOSIS — M545 Low back pain, unspecified: Secondary | ICD-10-CM

## 2018-08-17 MED ORDER — METHYLPREDNISOLONE ACETATE 80 MG/ML IJ SUSP
80.0000 mg | Freq: Once | INTRAMUSCULAR | Status: AC
  Administered 2018-08-17: 80 mg via INTRAMUSCULAR

## 2018-08-17 MED ORDER — METHOCARBAMOL 500 MG PO TABS: 500.0000 mg | ORAL_TABLET | Freq: Three times a day (TID) | ORAL | 1 refills | Status: DC | PRN

## 2018-08-17 NOTE — Patient Instructions (Signed)
You have a lumbar strain. Ok to take tylenol for baseline pain relief (1-2 extra strength tabs 3x/day) - do NOT exceed 4000mg  a day. You were given an IM injection of depomedrol today. Robaxin as needed for muscle spasms (no driving on this medicine if it makes you sleepy). Do not take aleve or ibuprofen. Topical capsaicin or biofreeze up to 4 times a day. Salon pas patches may be beneficial. Stay as active as possible. Do home exercises and stretches as directed - hold each for 20-30 seconds and do each one three times. Start physical therapy and do home exercises on days you don't go to therapy. Strengthening of low back muscles, abdominal musculature are key for long term pain relief. If not improving, will consider further imaging (MRI). Follow up with me in 1 month but contact me sooner if you're struggling over the next 1-2 weeks.

## 2018-08-20 ENCOUNTER — Encounter (HOSPITAL_COMMUNITY): Payer: Self-pay | Admitting: *Deleted

## 2018-08-20 ENCOUNTER — Encounter: Payer: Self-pay | Admitting: Family Medicine

## 2018-08-20 NOTE — Progress Notes (Signed)
PCP: Kristian CoveyBurchette, Bruce W, MD  Subjective:   HPI: Patient is a 58 y.o. male here for low back pain.  Patient reports he was in crawl[place] a couple days ago and lifted a pressure washer to move it over, felt a twinge in his low back similar to prior low back injuries. Woke up at 3am with pain in low back more on the right side. Pain is sharp and worse with movement. No radiation into legs. No numbness/tingling. No bowel or bladder dysfunction. Has tried tylenol and cryoderm. Avoids NSAIDs with history of CAD.  Past Medical History:  Diagnosis Date  . Allergy    seasonal   . Coronary artery disease   . Depression    situational   . Heart murmur    hx of in childhood   . Hyperlipidemia   . Hypertension   . Injury of tendon of long head of right biceps    January 2016    Current Outpatient Medications on File Prior to Visit  Medication Sig Dispense Refill  . aspirin EC 81 MG EC tablet Take 1 tablet (81 mg total) by mouth daily.    Marland Kitchen. atorvastatin (LIPITOR) 80 MG tablet Take 1 tablet (80 mg total) by mouth daily at 6 PM. 90 tablet 2  . carvedilol (COREG) 3.125 MG tablet Take 1 tablet (3.125 mg total) by mouth 2 (two) times daily. 180 tablet 3  . cetirizine (ZYRTEC) 10 MG tablet Take 10 mg by mouth 2 (two) times daily as needed.     . clonazePAM (KLONOPIN) 0.5 MG tablet Take 0.5-1 mg by mouth See admin instructions. Take 2 tablets in the morning and 1 tablet in the evening. May take 1 additional tablet as needed for anxiety    . ezetimibe (ZETIA) 10 MG tablet Take 1 tablet (10 mg total) by mouth daily. 30 tablet 11  . lisdexamfetamine (VYVANSE) 30 MG capsule Take 30 mg by mouth as needed.     Marland Kitchen. lisinopril (PRINIVIL,ZESTRIL) 20 MG tablet Take 1 tablet (20 mg total) by mouth daily. 90 tablet 2  . nitroGLYCERIN (NITROSTAT) 0.4 MG SL tablet Place 1 tablet (0.4 mg total) under the tongue every 5 (five) minutes x 3 doses as needed for chest pain. 25 tablet 1  . ticagrelor (BRILINTA) 90 MG  TABS tablet Take 1 tablet (90 mg total) by mouth 2 (two) times daily. 180 tablet 3   No current facility-administered medications on file prior to visit.     Past Surgical History:  Procedure Laterality Date  . APPENDECTOMY    . CARDIAC CATHETERIZATION    . COLONOSCOPY N/A 11/28/2014   Procedure: COLONOSCOPY;  Surgeon: Louis Meckelobert D Kaplan, MD;  Location: [facility];  Service: Endoscopy;  Laterality: N/A;  . colonscopy      removed polyps  . CORONARY STENT INTERVENTION N/A 11/20/2017   Procedure: CORONARY STENT INTERVENTION;  Surgeon: Runell GessBerry, Jonathan J, MD;  Location: [facility];  Service: Cardiovascular;  Laterality: N/A;  . INGUINAL HERNIA REPAIR Left 06/12/2014   Procedure: LEFT INGUINAL HERNIA REPAIR WITH MESH;  Surgeon: Darnell Levelodd Gerkin, MD;  Location: [facility];  Service: General;  Laterality: Left;  . INSERTION OF MESH Left 06/12/2014   Procedure: INSERTION OF MESH;  Surgeon: Darnell Levelodd Gerkin, MD;  Location: [facility];  Service: General;  Laterality: Left;  . LEFT HEART CATH AND CORONARY ANGIOGRAPHY N/A 11/20/2017   Procedure: LEFT HEART CATH AND CORONARY ANGIOGRAPHY;  Surgeon: Runell GessBerry, Jonathan J, MD;  Location: [facility];  Service: Cardiovascular;  Laterality: N/A;  . MOUTH SURGERY     wisdom teeth removed;root canal - 03/2014  . TONSILLECTOMY  1995  . TONSILLECTOMY      Allergies  Allergen Reactions  . Poison Ivy Extract [Poison Ivy Extract] Itching and Rash  . Poison Oak Extract [Poison Oak Extract] Itching and Rash    Social History   Socioeconomic History  . Marital status: Married    Spouse name: Not on file  . Number of children: Not on file  . Years of education: Not on file  . Highest education level: Professional school degree (e.g., MD, DDS, DVM, JD)  Occupational History  . Not on file  Social Needs  . Financial resource strain: Not hard at all  . Food insecurity    Worry: Never true    Inability: Never true  . Transportation needs    Medical: No     Non-medical: No  Tobacco Use  . Smoking status: Former Smoker    Packs/day: 1.50    Years: 23.00    Pack years: 34.50    Types: Cigarettes    Quit date: 02/22/1995    Years since quitting: 23.5  . Smokeless tobacco: Never Used  Substance and Sexual Activity  . Alcohol use: Yes    Comment: occas  . Drug use: No  . Sexual activity: Not on file  Lifestyle  . Physical activity    Days per week: 4 days    Minutes per session: 30 min  . Stress: Rather much  Relationships  . Social Herbalist on phone: Not on file    Gets together: Not on file    Attends religious service: Not on file    Active member of club or organization: Not on file    Attends meetings of clubs or organizations: Not on file    Relationship status: Not on file  . Intimate partner violence    Fear of current or ex partner: Not on file    Emotionally abused: Not on file    Physically abused: Not on file    Forced sexual activity: Not on file  Other Topics Concern  . Not on file  Social History Narrative  . Not on file    Family History  Problem Relation Age of Onset  . Depression Other   . Hyperlipidemia Other   . Hypertension Other   . Cancer Other        prostate  . Stroke Other   . Heart disease Other   . Cancer Paternal Grandfather   . Heart disease Paternal Grandfather   . Alzheimer's disease Mother   . AAA (abdominal aortic aneurysm) Mother     BP 122/78   Ht 5\' 9"  (1.753 m)   Wt 177 lb (80.3 kg)   BMI 26.14 kg/m   Review of Systems: See HPI above.     Objective:  Physical Exam:  Gen: NAD, comfortable in exam room  Back: No gross deformity, scoliosis. TTP right lumbar paraspinal region.  No midline or bony TTP. FROM with pain on flexion. Strength LEs 5/5 all muscle groups.   2+ MSRs in patellar and achilles tendons, equal bilaterally. Negative SLRs. Sensation intact to light touch bilaterally.  Bilateral hips: No deformity. FROM with 5/5 strength. No tenderness  to palpation. NVI distally. Negative logroll bilateral hips Negative fabers and piriformis stretches.   Assessment & Plan:  1. Low back pain - no red flags.  2/2 lumbar strain.  Tylenol, topical medications reviewed.  Robaxin for spasms.  Shown home exercises to do and will start physical therapy.  F/u in 1 month.  Consider MRI if not improving.

## 2018-08-20 NOTE — Progress Notes (Signed)
Pt inactive in [facility] App.  Letter mailed requesting patient to return call regarding this by 08/31/2018. If no response, will discharge from cardiac rehab program.  

## 2018-08-22 ENCOUNTER — Telehealth: Payer: Self-pay | Admitting: Cardiovascular Disease

## 2018-08-22 NOTE — Telephone Encounter (Signed)
LVM, reminding pt of his appt on 08-23-18 with Dr C, also needs his consent for his virtual visit.c

## 2018-08-22 NOTE — Telephone Encounter (Signed)
1. 3. Confirm Consent - "In the setting of the current Covid19 crisis, you are scheduled for a (phone or video) visit with your provider on (date) at (time).  Just as we do with many in-office visits, in order for you to participate in this visit, we must obtain consent.  If you'd like, I can send this to your mychart (if signed up) or email for you to review.  Otherwise, I can obtain your verbal consent now.  All virtual visits are billed to your insurance company just like a normal visit would be.  By agreeing to a virtual visit, we'd like you to understand that the technology does not allow for your provider to perform an examination, and thus may limit your provider's ability to fully assess your condition. If your provider identifies any concerns that need to be evaluated in person, we will make arrangements to do so.  Finally, though the technology is pretty good, we cannot assure that it will always work on either your or our end, and in the setting of a video visit, we may have to convert it to a phone-only visit.  In either situation, we cannot ensure that we have a secure connection.  Are you willing to proceed?" STAFF: Did the patient verbally acknowledge consent to telehealth visit? Document YES/NO here: Yes 2.  FULL LENGHT FOR TELE-HEALTH VISIT   I hereby voluntarily request, consent and authorize [facility] and its employed or contracted physicians, physician assistants, nurse practitioners or other licensed health care professionals (the Practitioner), to provide me with telemedicine health care services (the Services") as deemed necessary by the treating Practitioner. I acknowledge and consent to receive the Services by the Practitioner via telemedicine. I understand that the telemedicine visit will involve communicating with the Practitioner through live audiovisual communication technology and the disclosure of certain medical information by electronic transmission. I acknowledge  that I have been given the opportunity to request an in-person assessment or other available alternative prior to the telemedicine visit and am voluntarily participating in the telemedicine visit.  I understand that I have the right to withhold or withdraw my consent to the use of telemedicine in the course of my care at any time, without affecting my right to future care or treatment, and that the Practitioner or I may terminate the telemedicine visit at any time. I understand that I have the right to inspect all information obtained and/or recorded in the course of the telemedicine visit and may receive copies of available information for a reasonable fee.  I understand that some of the potential risks of receiving the Services via telemedicine include:   Delay or interruption in medical evaluation due to technological equipment failure or disruption;  Information transmitted may not be sufficient (e.g. poor resolution of images) to allow for appropriate medical decision making by the Practitioner; and/or   In rare instances, security protocols could fail, causing a breach of personal health information.  Furthermore, I acknowledge that it is my responsibility to provide information about my medical history, conditions and care that is complete and accurate to the best of my ability. I acknowledge that Practitioner's advice, recommendations, and/or decision may be based on factors not within their control, such as incomplete or inaccurate data provided by me or distortions of diagnostic images or specimens that may result from electronic transmissions. I understand that the practice of medicine is not an exact science and that Practitioner makes no warranties or guarantees regarding treatment outcomes. I  acknowledge that I will receive a copy of this consent concurrently upon execution via email to the email address I last provided but may also request a printed copy by calling the office of [facility].    I understand that my insurance will be billed for this visit.   I have read or had this consent read to me.  I understand the contents of this consent, which adequately explains the benefits and risks of the Services being provided via telemedicine.   I have been provided ample opportunity to ask questions regarding this consent and the Services and have had my questions answered to my satisfaction.  I give my informed consent for the services to be provided through the use of telemedicine in my medical care  By participating in this telemedicine visit I agree to the above.

## 2018-08-23 ENCOUNTER — Encounter: Payer: Self-pay | Admitting: Cardiovascular Disease

## 2018-08-23 ENCOUNTER — Telehealth (INDEPENDENT_AMBULATORY_CARE_PROVIDER_SITE_OTHER): Payer: BC Managed Care – PPO | Admitting: Cardiovascular Disease

## 2018-08-23 VITALS — Ht 69.0 in | Wt 177.0 lb

## 2018-08-23 DIAGNOSIS — I7300 Raynaud's syndrome without gangrene: Secondary | ICD-10-CM | POA: Diagnosis not present

## 2018-08-23 DIAGNOSIS — F4320 Adjustment disorder, unspecified: Secondary | ICD-10-CM | POA: Diagnosis not present

## 2018-08-23 DIAGNOSIS — I519 Heart disease, unspecified: Secondary | ICD-10-CM | POA: Diagnosis not present

## 2018-08-23 DIAGNOSIS — I10 Essential (primary) hypertension: Secondary | ICD-10-CM

## 2018-08-23 DIAGNOSIS — I2510 Atherosclerotic heart disease of native coronary artery without angina pectoris: Secondary | ICD-10-CM

## 2018-08-23 DIAGNOSIS — F900 Attention-deficit hyperactivity disorder, predominantly inattentive type: Secondary | ICD-10-CM | POA: Diagnosis not present

## 2018-08-23 DIAGNOSIS — E785 Hyperlipidemia, unspecified: Secondary | ICD-10-CM

## 2018-08-23 NOTE — Progress Notes (Signed)
Virtual Visit via Video Note   This visit type was conducted due to national recommendations for restrictions regarding the COVID-19 Pandemic (e.g. social distancing) in an effort to limit this patient's exposure and mitigate transmission in our community.  Due to his co-morbid illnesses, this patient is at least at moderate risk for complications without adequate follow up.  This format is felt to be most appropriate for this patient at this time.  All issues noted in this document were discussed and addressed.  A limited physical exam was performed with this format.  Please refer to the patient's chart for his consent to telehealth for [facility].   Date:  08/23/2018   ID:  Damon [name], DOB 06/01/1960, MRN [id number]  Patient Location: Home Provider Location: Other:  [place]  PCP:  Eulas Post, MD  Cardiologist:  Sanda Klein, MD  Electrophysiologist:  None   Evaluation Performed:  Follow-Up Visit  Chief Complaint:  CAD  History of Present Illness:    Damon [name] is a 58 y.o. male with a hx of acute non-STEMI leading to placement of a drug-eluting stent in the ramus intermedius artery November 20, 2017, hyperlipidemia, hypertension, Raynaud's syndrome.  The patient specifically denies any chest pain at rest exertion, dyspnea at rest or with exertion, orthopnea, paroxysmal nocturnal dyspnea, syncope, palpitations, focal neurological deficits, intermittent claudication, lower extremity edema, unexplained weight gain, cough, hemoptysis or wheezing. He completed "virtual" cardiac rehab.   Carvedilol was no better at preventing symptoms of Raynaud's syndrome compared to metoprolol.  This generally happens when the temperature drops of the 60 F.  It is not particularly troublesome right now.  He discussed his prostate situation with Dr. Lawerance Bach and was advised that he does not need a biopsy at this time.  Echo at presentation in September 2019 showed LVEF 45-50% with  lateral wall motion abnormalities.  Follow-up echo on December 5 shows resolution of the regional abnormalities with an EF of 50-55%.  The patient does not have symptoms concerning for COVID-19 infection (fever, chills, cough, or new shortness of breath).    Past Medical History:  Diagnosis Date  . Allergy    seasonal   . Coronary artery disease   . Depression    situational   . Heart murmur    hx of in childhood   . Hyperlipidemia   . Hypertension   . Injury of tendon of long head of right biceps    January 2016   Past Surgical History:  Procedure Laterality Date  . APPENDECTOMY    . CARDIAC CATHETERIZATION    . COLONOSCOPY N/A 11/28/2014   Procedure: COLONOSCOPY;  Surgeon: Inda Castle, MD;  Location: [facility];  Service: Endoscopy;  Laterality: N/A;  . colonscopy      removed polyps  . CORONARY STENT INTERVENTION N/A 11/20/2017   Procedure: CORONARY STENT INTERVENTION;  Surgeon: Lorretta Harp, MD;  Location: [place] [facility];  Service: Cardiovascular;  Laterality: N/A;  . INGUINAL HERNIA REPAIR Left 06/12/2014   Procedure: LEFT INGUINAL HERNIA REPAIR WITH MESH;  Surgeon: Armandina Gemma, MD;  Location: [facility];  Service: General;  Laterality: Left;  . INSERTION OF MESH Left 06/12/2014   Procedure: INSERTION OF MESH;  Surgeon: Armandina Gemma, MD;  Location: [facility];  Service: General;  Laterality: Left;  . LEFT HEART CATH AND CORONARY ANGIOGRAPHY N/A 11/20/2017   Procedure: LEFT HEART CATH AND CORONARY ANGIOGRAPHY;  Surgeon: Lorretta Harp, MD;  Location: [place]  CV [facility];  Service: Cardiovascular;  Laterality: N/A;  . MOUTH SURGERY     wisdom teeth removed;root canal - 03/2014  . TONSILLECTOMY  1995  . TONSILLECTOMY       Current Meds  Medication Sig  . aspirin EC 81 MG EC tablet Take 1 tablet (81 mg total) by mouth daily.  Marland Kitchen. atorvastatin (LIPITOR) 80 MG tablet Take 1 tablet (80 mg total) by mouth daily at 6 PM.  . cetirizine (ZYRTEC) 10 MG tablet Take 10 mg by mouth  2 (two) times daily as needed.   . clonazePAM (KLONOPIN) 0.5 MG tablet Take 0.5-1 mg by mouth See admin instructions. Take 2 tablets in the morning and 1 tablet in the evening. May take 1 additional tablet as needed for anxiety  . ezetimibe (ZETIA) 10 MG tablet Take 1 tablet (10 mg total) by mouth daily.  Marland Kitchen. lisdexamfetamine (VYVANSE) 30 MG capsule Take 30 mg by mouth as needed.   Marland Kitchen. lisinopril (PRINIVIL,ZESTRIL) 20 MG tablet Take 1 tablet (20 mg total) by mouth daily.  . methocarbamol (ROBAXIN) 500 MG tablet Take 1 tablet (500 mg total) by mouth every 8 (eight) hours as needed.  . nitroGLYCERIN (NITROSTAT) 0.4 MG SL tablet Place 1 tablet (0.4 mg total) under the tongue every 5 (five) minutes x 3 doses as needed for chest pain.  . [DISCONTINUED] ticagrelor (BRILINTA) 90 MG TABS tablet Take 1 tablet (90 mg total) by mouth 2 (two) times daily.     Allergies:   Poison ivy extract [poison ivy extract] and Poison oak extract [poison oak extract]   Social History   Tobacco Use  . Smoking status: Former Smoker    Packs/day: 1.50    Years: 23.00    Pack years: 34.50    Types: Cigarettes    Quit date: 02/22/1995    Years since quitting: 23.5  . Smokeless tobacco: Never Used  Substance Use Topics  . Alcohol use: Yes    Comment: occas  . Drug use: No     Family Hx: The patient's family history includes AAA (abdominal aortic aneurysm) in his mother; Alzheimer's disease in his mother; Cancer in his paternal grandfather and another family member; Depression in an other family member; Heart disease in his paternal grandfather and another family member; Hyperlipidemia in an other family member; Hypertension in an other family member; Stroke in an other family member.  ROS:   Please see the history of present illness.     All other systems reviewed and are negative. Prior CV studies:   The following studies were reviewed today:  Echo Dec 2020  Labs/Other Tests and Data Reviewed:    EKG:  An ECG  dated 12/05/2017 was personally reviewed today and demonstrated:  NSR, Q waves I and aVL, no acute repol changes.  Recent Labs: 12/19/2017: BUN 15; Creatinine, Ser 0.88; Hemoglobin 14.2; Platelets 206.0; Potassium 4.9; Sodium 138; TSH 0.97 01/22/2018: ALT 27   Recent Lipid Panel Lab Results  Component Value Date/Time   CHOL 153 07/02/2018 11:43 AM   TRIG 118 07/02/2018 11:43 AM   HDL 42 07/02/2018 11:43 AM   CHOLHDL 3.6 07/02/2018 11:43 AM   CHOLHDL 3 12/19/2017 12:19 PM   LDLCALC 87 07/02/2018 11:43 AM   LDLDIRECT 93.0 12/09/2015 08:20 AM    Wt Readings from Last 3 Encounters:  08/23/18 177 lb (80.3 kg)  08/17/18 177 lb (80.3 kg)  06/21/18 175 lb (79.4 kg)     Objective:    Vital Signs:  Ht 5\' 9"  (1.753 m)   Wt 177 lb (80.3 kg)   BMI 26.14 kg/m    VITAL SIGNS:  reviewed GEN:  no acute distress EYES:  sclerae anicteric, EOMI - Extraocular Movements Intact RESPIRATORY:  normal respiratory effort, symmetric expansion CARDIOVASCULAR:  no peripheral edema SKIN:  no rash, lesions or ulcers. MUSCULOSKELETAL:  no obvious deformities. NEURO:  alert and oriented x 3, no obvious focal deficit PSYCH:  normal affect  ASSESSMENT & PLAN:    1. CAD: Asymptomatic since presentation with non-STEMI in September.  Stop the Brilinta October 1. On statin and beta-blocker. 2. LV dysfunction: Improved, likely resolution of transient stunning, now with borderline LVEF at 50-55%.  On beta-blocker and ACE inhibitor and not requiring diuretics.  Asymptomatic and clinically euvolemic, NYHA functional class I 3. HLP:  Most recent lipid profile showed that his triglycerides have normalized but his LDL cholesterol remains above target (less than 70) despite maximum dose atorvastatin.  Zetia was added about a month ago and will recheck a lipid profile in August. 4. Raynaud's syndrome:  Carvedilol really was no better than metoprolol but his symptoms are minimal and no need to change medications at this  point. 5. Elevated PSA: had a second opinion about the PSA and no plan for biopsy at this time.  He will follow-up with Dr. Earlene Plateravis in about another 3 months.  By then it will be time to stop his Brilinta.   COVID-19 Education: The signs and symptoms of COVID-19 were discussed with the patient and how to seek care for testing (follow up with PCP or arrange E-visit).  The importance of social distancing was discussed today.  Time:   Today, I have spent 35 minutes with the patient with telehealth technology discussing the above problems.     Medication Adjustments/Labs and Tests Ordered: Current medicines are reviewed at length with the patient today.  Concerns regarding medicines are outlined above.   Tests Ordered: Orders Placed This Encounter  Procedures  . Lipid panel    Medication Changes: No orders of the defined types were placed in this encounter.   Follow Up:  Virtual Visit or In Person 6 months  Signed, Thurmon FairMihai Cashlynn Yearwood, MD  08/23/2018 11:24 AM     [facility]

## 2018-08-23 NOTE — Patient Instructions (Signed)
Medication Instructions:  STOP the Brilinta October 1st, 2020  If you need a refill on your cardiac medications before your next appointment, please call your pharmacy.   Lab work: Your provider would like for you to return in Augsust to have the following labs drawn: Fasting Lipid. You do not need an appointment for the lab. Once in our office lobby there is a podium where you can sign in and ring the doorbell to alert [place] that you are here. The lab is open from 8:00 am to 4:30 pm; closed for lunch from 12:45pm-1:45pm.  If you have labs (blood work) drawn today and your tests are completely normal, you will receive your results only by: Marland Kitchen MyChart Message (if you have MyChart) OR . A paper copy in the mail If you have any lab test that is abnormal or we need to change your treatment, we will call you to review the results.  Testing/Procedures: None ordered  Follow-Up: At [facility], you and your health needs are our priority.  As part of our continuing mission to provide you with exceptional heart care, we have created designated Provider Care Teams.  These Care Teams include your primary Cardiologist (physician) and Advanced Practice Providers (APPs -  Physician Assistants and Nurse Practitioners) who all work together to provide you with the care you need, when you need it. You will need a follow up appointment in 6 months.  Please call our office 2 months in advance to schedule this appointment.  You may see Sanda Klein, MD or one of the following Advanced Practice Providers on your designated Care Team: [place], [place] . Fabian Sharp, PA-C

## 2018-08-28 ENCOUNTER — Encounter (HOSPITAL_COMMUNITY)
Admission: RE | Admit: 2018-08-28 | Discharge: 2018-08-28 | Disposition: A | Discharge status: Home / Self Care | Payer: Self-pay | Source: Ambulatory Visit | Attending: Cardiovascular Disease | Admitting: Cardiovascular Disease

## 2018-08-28 ENCOUNTER — Telehealth (HOSPITAL_COMMUNITY): Payer: Self-pay

## 2018-08-28 ENCOUNTER — Ambulatory Visit: Payer: BLUE CROSS/BLUE SHIELD | Admitting: Cardiovascular Disease

## 2018-08-28 DIAGNOSIS — I214 Non-ST elevation (NSTEMI) myocardial infarction: Secondary | ICD-10-CM | POA: Insufficient documentation

## 2018-08-28 DIAGNOSIS — I2510 Atherosclerotic heart disease of native coronary artery without angina pectoris: Secondary | ICD-10-CM | POA: Insufficient documentation

## 2018-08-28 DIAGNOSIS — Z955 Presence of coronary angioplasty implant and graft: Secondary | ICD-10-CM | POA: Insufficient documentation

## 2018-08-28 NOTE — Telephone Encounter (Signed)
Phone call returned to Pt. Pt is now active in virtual CR app an is logging exercise. Encouraged Pt to continue doing so. Pt was responsive ands stated he would try to log exercise more often. Pt is doing well.

## 2018-09-03 ENCOUNTER — Other Ambulatory Visit: Payer: Self-pay

## 2018-09-03 ENCOUNTER — Telehealth: Payer: Self-pay | Admitting: Cardiovascular Disease

## 2018-09-03 NOTE — Telephone Encounter (Signed)
Patient wants to know how our billing office can appeal the charges for his cardiac rehab.  There is a significant difference in charges based on what his insurance company told him versus what he was actually billed for rehab.   Any help would be appreciated

## 2018-09-11 DIAGNOSIS — D2272 Melanocytic nevi of left lower limb, including hip: Secondary | ICD-10-CM | POA: Diagnosis not present

## 2018-09-11 DIAGNOSIS — L812 Freckles: Secondary | ICD-10-CM | POA: Diagnosis not present

## 2018-09-11 DIAGNOSIS — L820 Inflamed seborrheic keratosis: Secondary | ICD-10-CM | POA: Diagnosis not present

## 2018-09-11 DIAGNOSIS — L578 Other skin changes due to chronic exposure to nonionizing radiation: Secondary | ICD-10-CM | POA: Diagnosis not present

## 2018-09-11 DIAGNOSIS — L821 Other seborrheic keratosis: Secondary | ICD-10-CM | POA: Diagnosis not present

## 2018-09-14 DIAGNOSIS — F900 Attention-deficit hyperactivity disorder, predominantly inattentive type: Secondary | ICD-10-CM | POA: Diagnosis not present

## 2018-09-19 ENCOUNTER — Encounter: Payer: Self-pay | Admitting: Family Medicine

## 2018-09-19 ENCOUNTER — Other Ambulatory Visit: Payer: Self-pay

## 2018-09-19 ENCOUNTER — Ambulatory Visit (INDEPENDENT_AMBULATORY_CARE_PROVIDER_SITE_OTHER): Payer: BC Managed Care – PPO | Admitting: Family Medicine

## 2018-09-19 VITALS — BP 128/80 | Ht 69.0 in | Wt 177.0 lb

## 2018-09-19 DIAGNOSIS — M7542 Impingement syndrome of left shoulder: Secondary | ICD-10-CM

## 2018-09-19 DIAGNOSIS — S39012D Strain of muscle, fascia and tendon of lower back, subsequent encounter: Secondary | ICD-10-CM

## 2018-09-19 MED ORDER — METHYLPREDNISOLONE ACETATE 40 MG/ML IJ SUSP
40.0000 mg | Freq: Once | INTRAMUSCULAR | Status: AC
  Administered 2018-09-19: 40 mg via INTRA_ARTICULAR

## 2018-09-19 NOTE — Patient Instructions (Signed)
You have rotator cuff impingement Try to avoid painful activities (overhead activities, lifting with extended arm) as much as possible. Tylenol as needed for pain. Subacromial injection may be beneficial to help with pain and to decrease inflammation - you were given this today. Consider physical therapy with transition to home exercise program. Do home exercise program with theraband and scapular stabilization exercises daily 3 sets of 10 once a day. If not improving at follow-up we will consider imaging, physical therapy, and/or nitro patches. Follow up with me in 6 weeks.

## 2018-09-19 NOTE — Progress Notes (Signed)
PCP: Kristian CoveyBurchette, Bruce W, MD  Subjective:   HPI: Patient is a 58 y.o. male here for follow-up of low back pain as well as recurrent left shoulder pain.  Patient was last seen for a lumbar strain of his lower back.  The pain initially started after power washing his deck.  Patient never had any radicular symptoms.  He was given home exercises to work on during his last visit.  Patient notes since his last visit he has had near complete resolution of his back pain.  He is not having any radicular symptoms down his leg.  He denies any weakness of his lower extremities.  He denies any numbness or tingling of his lower extremities.  Patient notes he will continue to work on his home therapy exercises.  Patient also presents for left shoulder pain.  Patient notes his pain has been present for several months.  The pain is located over the lateral aspect of his left shoulder.  The pain does not radiate.  It has a dull aching quality.  He has night pain.  Patient notes difficulty with overhead activities.  He denies any neck pain.  He is unable to take NSAIDs due to previous history of MI x2.  He is taking Tylenol 1 g 3 times daily but notes only offers minor improvement in his symptoms.  He previously was given home exercises for treatment of subacromial impingement on his left which initially helped his pain but he is recently stopped these due to his back pain.   Review of Systems: See HPI above.  Past Medical History:  Diagnosis Date  . Allergy    seasonal   . Coronary artery disease   . Depression    situational   . Heart murmur    hx of in childhood   . Hyperlipidemia   . Hypertension   . Injury of tendon of long head of right biceps    January 2016    Current Outpatient Medications on File Prior to Visit  Medication Sig Dispense Refill  . aspirin EC 81 MG EC tablet Take 1 tablet (81 mg total) by mouth daily.    Marland Kitchen. atorvastatin (LIPITOR) 80 MG tablet Take 1 tablet (80 mg total) by mouth  daily at 6 PM. 90 tablet 2  . carvedilol (COREG) 3.125 MG tablet Take 1 tablet (3.125 mg total) by mouth 2 (two) times daily. 180 tablet 3  . cetirizine (ZYRTEC) 10 MG tablet Take 10 mg by mouth 2 (two) times daily as needed.     . clonazePAM (KLONOPIN) 0.5 MG tablet Take 0.5-1 mg by mouth See admin instructions. Take 2 tablets in the morning and 1 tablet in the evening. May take 1 additional tablet as needed for anxiety    . ezetimibe (ZETIA) 10 MG tablet Take 1 tablet (10 mg total) by mouth daily. 30 tablet 11  . lisdexamfetamine (VYVANSE) 30 MG capsule Take 30 mg by mouth as needed.     Marland Kitchen. lisinopril (PRINIVIL,ZESTRIL) 20 MG tablet Take 1 tablet (20 mg total) by mouth daily. 90 tablet 2  . methocarbamol (ROBAXIN) 500 MG tablet Take 1 tablet (500 mg total) by mouth every 8 (eight) hours as needed. 60 tablet 1  . nitroGLYCERIN (NITROSTAT) 0.4 MG SL tablet Place 1 tablet (0.4 mg total) under the tongue every 5 (five) minutes x 3 doses as needed for chest pain. 25 tablet 1   No current facility-administered medications on file prior to visit.     Past  Surgical History:  Procedure Laterality Date  . APPENDECTOMY    . CARDIAC CATHETERIZATION    . COLONOSCOPY N/A 11/28/2014   Procedure: COLONOSCOPY;  Surgeon: Inda Castle, MD;  Location: [facility];  Service: Endoscopy;  Laterality: N/A;  . colonscopy      removed polyps  . CORONARY STENT INTERVENTION N/A 11/20/2017   Procedure: CORONARY STENT INTERVENTION;  Surgeon: Lorretta Harp, MD;  Location: [place] [facility];  Service: Cardiovascular;  Laterality: N/A;  . INGUINAL HERNIA REPAIR Left 06/12/2014   Procedure: LEFT INGUINAL HERNIA REPAIR WITH MESH;  Surgeon: Armandina Gemma, MD;  Location: [facility];  Service: General;  Laterality: Left;  . INSERTION OF MESH Left 06/12/2014   Procedure: INSERTION OF MESH;  Surgeon: Armandina Gemma, MD;  Location: [facility];  Service: General;  Laterality: Left;  . LEFT HEART CATH AND CORONARY ANGIOGRAPHY N/A  11/20/2017   Procedure: LEFT HEART CATH AND CORONARY ANGIOGRAPHY;  Surgeon: Lorretta Harp, MD;  Location: [place] [facility];  Service: Cardiovascular;  Laterality: N/A;  . MOUTH SURGERY     wisdom teeth removed;root canal - 03/2014  . TONSILLECTOMY  1995  . TONSILLECTOMY      Allergies  Allergen Reactions  . Poison Ivy Extract [Poison Ivy Extract] Itching and Rash  . Poison Oak Extract [Poison Oak Extract] Itching and Rash    Social History   Socioeconomic History  . Marital status: Married    Spouse name: Not on file  . Number of children: Not on file  . Years of education: Not on file  . Highest education level: Professional school degree (e.g., MD, DDS, DVM, JD)  Occupational History  . Not on file  Social Needs  . Financial resource strain: Not hard at all  . Food insecurity    Worry: Never true    Inability: Never true  . Transportation needs    Medical: No    Non-medical: No  Tobacco Use  . Smoking status: Former Smoker    Packs/day: 1.50    Years: 23.00    Pack years: 34.50    Types: Cigarettes    Quit date: 02/22/1995    Years since quitting: 23.5  . Smokeless tobacco: Never Used  Substance and Sexual Activity  . Alcohol use: Yes    Comment: occas  . Drug use: No  . Sexual activity: Not on file  Lifestyle  . Physical activity    Days per week: 4 days    Minutes per session: 30 min  . Stress: Rather much  Relationships  . Social Herbalist on phone: Not on file    Gets together: Not on file    Attends religious service: Not on file    Active member of club or organization: Not on file    Attends meetings of clubs or organizations: Not on file    Relationship status: Not on file  . Intimate partner violence    Fear of current or ex partner: Not on file    Emotionally abused: Not on file    Physically abused: Not on file    Forced sexual activity: Not on file  Other Topics Concern  . Not on file  Social History Narrative  . Not on  file    Family History  Problem Relation Age of Onset  . Depression Other   . Hyperlipidemia Other   . Hypertension Other   . Cancer Other        prostate  .  Stroke Other   . Heart disease Other   . Cancer Paternal Grandfather   . Heart disease Paternal Grandfather   . Alzheimer's disease Mother   . AAA (abdominal aortic aneurysm) Mother         Objective:  Physical Exam: BP 128/80   Ht 5\' 9"  (1.753 m)   Wt 177 lb (80.3 kg)   BMI 26.14 kg/m  Gen: NAD, comfortable in exam room Lungs: Breathing comfortably on room air  Shoulder Exam Left -Inspection: No discoloration, no deformity -Palpation: TTP over the supraspinatus insertion in the lateral shoulder -ROM (active): Abduction: 180 degrees; Forward Flexion: 180 degrees; Internal Rotation: T10 -ROM (Passive): Abduction: 180 degrees; Forward Flexion: 180 degrees; Internal Rotation: T10 -Strength: Abduction: 5/5; Forward Flexion: 5/5; Internal Rotation: 5/5; External Rotation: 5/5 -Special Tests: Hawkins: positive; Neers: positive; Jobs: Negative; O'briens: Negative; Speeds: Negative; Yergasons: Negative -Limb neurovascularly intact  Contralateral Shoulder -Inspection: No discoloration, no deformity -Palpation: No tenderness to palpation -ROM (active): Abduction: 180 degrees; Forward Flexion: 180 degrees; Internal Rotation: T10 -ROM (Passive): Abduction: 180 degrees; Forward Flexion: 180 degrees; Internal Rotation: T10 -Strength: Abduction: 5/5; Forward Flexion: 5/5; Internal Rotation: 5/5; External Rotation: 5/5 -Limb neurovascularly intact  Lumbar Exam -Inspection: No deformity, no discoloration -Palpation: SI joint: non-tender bilaterally; Paraspinal muscles: non-tender bilaterally; Spinous processes: non-tender   Assessment & Plan:  Patient is a 58 y.o. male here for follow-up of low back pain as well as recurrent left shoulder pain.  1.  Lumbar muscle strain - Patient to continue home therapy exercises -  Patient encouraged to continue to strengthen his core as this will help prevent recurrent back pain in the future - If back pain recurs or if he develops radicular symptoms we will consider further work-up with an MRI  2.  Left subacromial impingement - Patient with good range of motion and strength however significant nighttime symptoms as well as pain with overhead activities - Several different treatment options were discussed with patient including formal PT versus home PT as well as a potential subacromial steroid injection -Patient declined formal physical therapy but has elected to do home exercises for his shoulder -Patient consented to a subacromial injection of his left shoulder.  Timeout was performed.  This was done under ultrasound guidance.  40 mg of Depo-Medrol and 3 cc of lidocaine were injected into his subacromial space in sterile fashion.  Patient tolerated the procedure well - Patient should continue to avoid NSAIDs but may take Tylenol as needed  Follow-up in 6 weeks

## 2018-09-28 DIAGNOSIS — F900 Attention-deficit hyperactivity disorder, predominantly inattentive type: Secondary | ICD-10-CM | POA: Diagnosis not present

## 2018-10-01 ENCOUNTER — Encounter: Payer: Self-pay | Admitting: *Deleted

## 2018-10-02 NOTE — Addendum Note (Signed)
Encounter addended by: Sol Passer on: 10/02/2018 3:22 PM  Actions taken: Flowsheet data copied forward, Clinical Note Signed

## 2018-10-02 NOTE — Progress Notes (Signed)
Discharge Progress Report  Patient Details  Name: Damon [name] MRN: [id number] Date of Birth: 1960/03/09 Referring Provider:     CARDIAC REHAB [place] from 02/08/2018 in [place]  Referring Provider  Dr. Sallyanne Kuster       Number of Visits: 23 sessions completed on-site at cardiac rehab. Patient transitioned to virtual cardiac rehab via [facility]. Patient is active on app.  Reason for Discharge:  Patient independent in their exercise.  Smoking History:  Social History   Tobacco Use  Smoking Status Former Smoker  . Packs/day: 1.50  . Years: 23.00  . Pack years: 34.50  . Types: Cigarettes  . Quit date: 02/22/1995  . Years since quitting: 23.6  Smokeless Tobacco Never Used    Diagnosis:  Status post coronary artery stent placement 11/20/17 DES CFX  NSTEMI (non-ST elevated myocardial infarction) ([place]) 11/20/17  ADL UCSD:   Initial Exercise Prescription:   Discharge Exercise Prescription (Final Exercise Prescription Changes): Exercise Prescription Changes - 05/04/18 0852      Response to Exercise   Blood Pressure (Admit)  114/70    Blood Pressure (Exercise)  118/72    Blood Pressure (Exit)  102/62    Heart Rate (Admit)  75 bpm    Heart Rate (Exercise)  140 bpm    Heart Rate (Exit)  85 bpm    Rating of Perceived Exertion (Exercise)  10    Duration  Progress to 30 minutes of  aerobic without signs/symptoms of physical distress    Intensity  THRR unchanged      Progression   Progression  Continue to progress workloads to maintain intensity without signs/symptoms of physical distress.    Average METs  8.5      Resistance Training   Training Prescription  Yes    Weight  8 lbs.     Reps  10-15    Time  10 Minutes      Treadmill   MPH  4    Grade  5    Minutes  10      Elliptical   Level  3    Speed  3    Minutes  10      Rower   Level  7    Watts  130    Minutes  10       Functional  Capacity:   Psychological, QOL, Others - Outcomes: PHQ 2/9: Depression screen [facility] 2/9 02/16/2018 03/11/2016 04/23/2015 07/16/2014  Decreased Interest 0 0 0 0  Down, Depressed, Hopeless 0 0 0 0  PHQ - 2 Score 0 0 0 0    Quality of Life:   Personal Goals: Goals established at orientation with interventions provided to work toward goal.    Personal Goals Discharge: Goals and Risk Factor Review    Row Name 04/24/18 1320 05/09/18 1123           Core Components/Risk Factors/Patient Goals Review   Personal Goals Review  Weight Management/Obesity;Lipids;Hypertension;Stress  Weight Management/Obesity;Lipids;Hypertension;Stress      Review  Pt with multiple CAD RFs willing to participate in CR exercise.  [name] would like to lose weight and keep his heart strong. pt reports he is discouraged about inability to lose weight thus far.  pt encouraged to continue weight loss efforts. pt demonstrates improved tolerance of workloads and understanding of THR exercise guidelines.    Pt with multiple CAD RFs willing to participate in CR exercise.  [name] would like to lose weight  and keep his heart strong. pt demonstrates improved tolerance of workloads and understanding of THR exercise guidelines. pt currently on hold for departmental closing from Covid 19 precautions        Expected Outcomes  Pt will continue to participate in CR exercise, nutrition, and lifestyle modification.   Pt will continue to participate in CR exercise, nutrition, and lifestyle modification.          Exercise Goals and Review:   Exercise Goals Re-Evaluation: Exercise Goals Re-Evaluation    Row Name 04/25/18 0817 05/10/18 0858           Exercise Goal Re-Evaluation   Exercise Goals Review  -  Increase Physical Activity;Increase Strength and Stamina;Able to check pulse independently;Understanding of Exercise Prescription;Knowledge and understanding of Target Heart Rate Range (THRR);Able to understand and use rate of perceived  exertion (RPE) scale      Comments  Reviewed METs and goals with Pt. MET level is 7.8 and is progressing well. Continues at [employer] 2-3 days per week doing aerobic training in addition to cardiac rehab.   Unable to review METs and goals with Pt due to closure of Cardiac Rehab for [place]. Pt MET level is 8.5 and is progressing well. Pt is tolerating exercise Rx well.       Expected Outcomes  Will monitor and progress Pt as tolerated.   Will monitor and progress Pt as tolerated.          Nutrition & Weight - Outcomes:  Post Biometrics - 05/04/18 1130       Post  Biometrics   Weight  79.5 kg       Nutrition:   Nutrition Discharge:   Education Questionnaire Score:   Unable to complete post assessment due to department closure secondary to COVID-19 pandemic.

## 2018-10-08 DIAGNOSIS — N138 Other obstructive and reflux uropathy: Secondary | ICD-10-CM | POA: Diagnosis not present

## 2018-10-08 DIAGNOSIS — N401 Enlarged prostate with lower urinary tract symptoms: Secondary | ICD-10-CM | POA: Diagnosis not present

## 2018-10-08 DIAGNOSIS — R972 Elevated prostate specific antigen [PSA]: Secondary | ICD-10-CM | POA: Diagnosis not present

## 2018-10-10 ENCOUNTER — Telehealth: Payer: Self-pay | Admitting: Cardiovascular Disease

## 2018-10-10 DIAGNOSIS — N400 Enlarged prostate without lower urinary tract symptoms: Secondary | ICD-10-CM

## 2018-10-10 MED ORDER — TICAGRELOR 90 MG PO TABS: 90.0000 mg | ORAL_TABLET | Freq: Two times a day (BID) | ORAL | Status: DC

## 2018-10-10 NOTE — Telephone Encounter (Signed)
PSA is something to ask your PCP to be done. He states that he said the this is done with [place] because of Brilinta. Please advise recent one was 5.39 and he states that he needs you to run another test. Pt taking cipro BID for this. Can you add to your current blood work.

## 2018-10-10 NOTE — Telephone Encounter (Signed)
The PSA order was placed. He will have this completed with his fasting lipid next week.

## 2018-10-10 NOTE — Telephone Encounter (Signed)
If we are drawing other labs, glad to add PSA. If he just needs PSA, please refer to Urologist or PCP. Lattie Haw would be happy to talk to him.

## 2018-10-10 NOTE — Telephone Encounter (Signed)
° °  New message   Patient calling to update med list to include Brilinta.  Patient calling to request PSA test.

## 2018-10-12 DIAGNOSIS — F900 Attention-deficit hyperactivity disorder, predominantly inattentive type: Secondary | ICD-10-CM | POA: Diagnosis not present

## 2018-10-18 DIAGNOSIS — F411 Generalized anxiety disorder: Secondary | ICD-10-CM | POA: Diagnosis not present

## 2018-10-23 DIAGNOSIS — F900 Attention-deficit hyperactivity disorder, predominantly inattentive type: Secondary | ICD-10-CM | POA: Diagnosis not present

## 2018-10-24 DIAGNOSIS — F4320 Adjustment disorder, unspecified: Secondary | ICD-10-CM | POA: Diagnosis not present

## 2018-10-24 DIAGNOSIS — F909 Attention-deficit hyperactivity disorder, unspecified type: Secondary | ICD-10-CM | POA: Diagnosis not present

## 2018-10-29 DIAGNOSIS — Z20828 Contact with and (suspected) exposure to other viral communicable diseases: Secondary | ICD-10-CM | POA: Diagnosis not present

## 2018-10-31 ENCOUNTER — Other Ambulatory Visit: Payer: Self-pay

## 2018-10-31 ENCOUNTER — Ambulatory Visit (INDEPENDENT_AMBULATORY_CARE_PROVIDER_SITE_OTHER): Payer: BC Managed Care – PPO | Admitting: Family Medicine

## 2018-10-31 ENCOUNTER — Encounter: Payer: Self-pay | Admitting: Family Medicine

## 2018-10-31 VITALS — BP 116/82 | Ht 69.0 in | Wt 177.0 lb

## 2018-10-31 DIAGNOSIS — G8929 Other chronic pain: Secondary | ICD-10-CM | POA: Diagnosis not present

## 2018-10-31 DIAGNOSIS — M545 Low back pain, unspecified: Secondary | ICD-10-CM

## 2018-10-31 DIAGNOSIS — M7542 Impingement syndrome of left shoulder: Secondary | ICD-10-CM

## 2018-10-31 NOTE — Patient Instructions (Addendum)
Your shoulder pain is caused by a impingement of the rotator cuff - Your progress is on track with where we expect it to be at this point in time -Continue working on the home strengthening exercises for your rotator cuff  Your low back pain is likely muscular in origin.  It is unlikely that there is a pinched nerve that is causing your back pain - Your current options for treatment are getting an MRI to further evaluate the cause of your back pain, doing a trial of formal physical therapy, or trying a course of oral steroids to see if this improves your symptoms -If your back pain worsens then we can reevaluate your symptoms.  If you develop symptoms that shoot down your leg please let us know as this would make it more likely that you need an MRI of your back  Follow-up with [place] as needed

## 2018-10-31 NOTE — Progress Notes (Signed)
PCP: Kristian CoveyBurchette, Bruce W, MD  Subjective:   HPI: Patient is a 58 y.o. male here for follow-up of left subacromial impingement and low back pain.  Patient was last seen at the end of July for his low back pain.  At that time he was not having any radicular symptoms and his pain was thought to be caused by a lumbar muscle strain.  He was given core strengthening and stretching exercises to work on at home.  Patient declined formal physical therapy at the time.  Patient notes slight improvement in his back pain since his last visit.  He continues to deny radicular symptoms.  Patient notes his pain is been present for several years now.  The pain is tolerable with his daily activities.  He has started using a Peloton which has actually improved his back pain.  Pain has an aching quality to it.  He denies any numbness or tingling down his legs.  He has no overlying skin changes.  Patient also follows up for left subacromial impingement.  At his last visit he was given an ultrasound-guided subacromial corticosteroid injection.  He was also given home exercises to work on.  He notes his shoulder pain has improved significantly since his last visit.  He now has normal range of motion and normal strength.  He still gets some discomfort with lifting weights but this is tolerable.  Pain is located on lateral aspect of his shoulder it does not radiate.  He has no numbness or tingling.  Patient denies any bruising or swelling.  Review of Systems: See HPI above.  Past Medical History:  Diagnosis Date  . Allergy    seasonal   . Coronary artery disease   . Depression    situational   . Heart murmur    hx of in childhood   . Hyperlipidemia   . Hypertension   . Injury of tendon of long head of right biceps    January 2016    Current Outpatient Medications on File Prior to Visit  Medication Sig Dispense Refill  . aspirin EC 81 MG EC tablet Take 1 tablet (81 mg total) by mouth daily.    Marland Kitchen. atorvastatin  (LIPITOR) 80 MG tablet Take 1 tablet (80 mg total) by mouth daily at 6 PM. 90 tablet 2  . carvedilol (COREG) 3.125 MG tablet Take 1 tablet (3.125 mg total) by mouth 2 (two) times daily. 180 tablet 3  . cetirizine (ZYRTEC) 10 MG tablet Take 10 mg by mouth 2 (two) times daily as needed.     . clonazePAM (KLONOPIN) 0.5 MG tablet Take 0.5-1 mg by mouth See admin instructions. Take 2 tablets in the morning and 1 tablet in the evening. May take 1 additional tablet as needed for anxiety    . ezetimibe (ZETIA) 10 MG tablet Take 1 tablet (10 mg total) by mouth daily. 30 tablet 11  . lisdexamfetamine (VYVANSE) 30 MG capsule Take 30 mg by mouth as needed.     Marland Kitchen. lisinopril (PRINIVIL,ZESTRIL) 20 MG tablet Take 1 tablet (20 mg total) by mouth daily. 90 tablet 2  . methocarbamol (ROBAXIN) 500 MG tablet Take 1 tablet (500 mg total) by mouth every 8 (eight) hours as needed. 60 tablet 1  . nitroGLYCERIN (NITROSTAT) 0.4 MG SL tablet Place 1 tablet (0.4 mg total) under the tongue every 5 (five) minutes x 3 doses as needed for chest pain. 25 tablet 1  . ticagrelor (BRILINTA) 90 MG TABS tablet Take 1 tablet (90  mg total) by mouth 2 (two) times daily. 60 tablet    No current facility-administered medications on file prior to visit.     Past Surgical History:  Procedure Laterality Date  . APPENDECTOMY    . CARDIAC CATHETERIZATION    . COLONOSCOPY N/A 11/28/2014   Procedure: COLONOSCOPY;  Surgeon: Louis Meckel, MD;  Location: [facility];  Service: Endoscopy;  Laterality: N/A;  . colonscopy      removed polyps  . CORONARY STENT INTERVENTION N/A 11/20/2017   Procedure: CORONARY STENT INTERVENTION;  Surgeon: Runell Gess, MD;  Location: [facility];  Service: Cardiovascular;  Laterality: N/A;  . INGUINAL HERNIA REPAIR Left 06/12/2014   Procedure: LEFT INGUINAL HERNIA REPAIR WITH MESH;  Surgeon: Darnell Level, MD;  Location: [facility];  Service: General;  Laterality: Left;  . INSERTION OF MESH Left 06/12/2014    Procedure: INSERTION OF MESH;  Surgeon: Darnell Level, MD;  Location: [facility];  Service: General;  Laterality: Left;  . LEFT HEART CATH AND CORONARY ANGIOGRAPHY N/A 11/20/2017   Procedure: LEFT HEART CATH AND CORONARY ANGIOGRAPHY;  Surgeon: Runell Gess, MD;  Location: [facility];  Service: Cardiovascular;  Laterality: N/A;  . MOUTH SURGERY     wisdom teeth removed;root canal - 03/2014  . TONSILLECTOMY  1995  . TONSILLECTOMY      Allergies  Allergen Reactions  . Poison Ivy Extract [Poison Ivy Extract] Itching and Rash  . Poison Oak Extract [Poison Oak Extract] Itching and Rash    Social History   Socioeconomic History  . Marital status: Married    Spouse name: Not on file  . Number of children: Not on file  . Years of education: Not on file  . Highest education level: Professional school degree (e.g., MD, DDS, DVM, JD)  Occupational History  . Not on file  Social Needs  . Financial resource strain: Not hard at all  . Food insecurity    Worry: Never true    Inability: Never true  . Transportation needs    Medical: No    Non-medical: No  Tobacco Use  . Smoking status: Former Smoker    Packs/day: 1.50    Years: 23.00    Pack years: 34.50    Types: Cigarettes    Quit date: 02/22/1995    Years since quitting: 23.7  . Smokeless tobacco: Never Used  Substance and Sexual Activity  . Alcohol use: Yes    Comment: occas  . Drug use: No  . Sexual activity: Not on file  Lifestyle  . Physical activity    Days per week: 4 days    Minutes per session: 30 min  . Stress: Rather much  Relationships  . Social Musician on phone: Not on file    Gets together: Not on file    Attends religious service: Not on file    Active member of club or organization: Not on file    Attends meetings of clubs or organizations: Not on file    Relationship status: Not on file  . Intimate partner violence    Fear of current or ex partner: Not on file    Emotionally abused:  Not on file    Physically abused: Not on file    Forced sexual activity: Not on file  Other Topics Concern  . Not on file  Social History Narrative  . Not on file    Family History  Problem Relation Age of Onset  .  Depression Other   . Hyperlipidemia Other   . Hypertension Other   . Cancer Other        prostate  . Stroke Other   . Heart disease Other   . Cancer Paternal Grandfather   . Heart disease Paternal Grandfather   . Alzheimer's disease Mother   . AAA (abdominal aortic aneurysm) Mother         Objective:  Physical Exam: There were no vitals taken for this visit. Gen: NAD, comfortable in exam room Lungs: Breathing comfortably on room air Shoulder Exam Left -Inspection: No discoloration, no deformity, no instability -Palpation: No tenderness to palpation -ROM (active): Abduction: 180 degrees; Forward Flexion: 180 degrees; Internal Rotation: T10 -ROM (Passive): Abduction: 180 degrees; Forward Flexion: 180 degrees; Internal Rotation: T10 -Strength: Abduction: 5/5; Forward Flexion: 5/5; Internal Rotation: 5/5; External Rotation: 5/5 -Special Tests: Hawkins: Negative; Neers: Negative; Jobs: Negative; O'briens: Negative; Speeds: Negative -Limb neurovascularly intact.  No instability noted  Lumbar Exam -Inspection: No deformity, no discoloration, no instability -Palpation: Mild tenderness palpation in the bilateral paraspinal muscle region of the lumbar spine -ROM: Normal ROM with forward flexion, extension, lateral bending bilaterally, and rotation bilaterally -Strength: 5/5 hip flexion bilaterally, 5/5 knee extension bilaterally, 5/5 knee flexion bilaterally, 5/5 foot dorsiflexion bilaterally, 5/5 foot plantarflexion bilaterally -Straight leg: Negative -SLUMP: Negative -FABER: Negative   Assessment & Plan:  Patient is a 58 y.o. male here for follow-up of low back pain and left rotator cuff impingement  1.  Left rotator cuff impingement -Patient with good interval  improvement since his last visit following corticosteroid injection -Patient to continue home strengthening exercises -Patient may continue Tylenol as needed for pain  2.  Lumbar back pain - Patient given several different treatment options including MRI versus formal physical therapy versus oral steroids.  Patient notes he is happy with his current progress and will continue doing home physical therapy.  At this time he declined other treatment options -Patient may continue to take Tylenol as needed for pain  Follow-up as needed

## 2018-11-01 ENCOUNTER — Encounter: Payer: Self-pay | Admitting: Family Medicine

## 2018-11-09 DIAGNOSIS — F909 Attention-deficit hyperactivity disorder, unspecified type: Secondary | ICD-10-CM | POA: Diagnosis not present

## 2018-11-14 ENCOUNTER — Ambulatory Visit (INDEPENDENT_AMBULATORY_CARE_PROVIDER_SITE_OTHER): Payer: BC Managed Care – PPO | Admitting: Family Medicine

## 2018-11-14 ENCOUNTER — Encounter: Payer: Self-pay | Admitting: Family Medicine

## 2018-11-14 ENCOUNTER — Other Ambulatory Visit: Payer: Self-pay

## 2018-11-14 VITALS — BP 126/76 | HR 72 | Temp 97.9°F | Ht 67.0 in | Wt 175.0 lb

## 2018-11-14 DIAGNOSIS — Z23 Encounter for immunization: Secondary | ICD-10-CM | POA: Diagnosis not present

## 2018-11-14 DIAGNOSIS — Z0000 Encounter for general adult medical examination without abnormal findings: Secondary | ICD-10-CM | POA: Diagnosis not present

## 2018-11-14 LAB — BASIC METABOLIC PANEL
BUN: 19 mg/dL (ref 6–23)
CO2: 28 mEq/L (ref 19–32)
Calcium: 10.5 mg/dL (ref 8.4–10.5)
Chloride: 96 mEq/L (ref 96–112)
Creatinine, Ser: 0.95 mg/dL (ref 0.40–1.50)
GFR: 81.08 mL/min (ref >60.00)
Glucose, Bld: 90 mg/dL (ref 70–99)
Potassium: 5.2 mEq/L — ABNORMAL HIGH (ref 3.5–5.1)
Sodium: 135 mEq/L (ref 135–145)

## 2018-11-14 LAB — LIPID PANEL
Cholesterol: 137 mg/dL (ref 0–200)
HDL: 45 mg/dL (ref >39.00)
LDL Cholesterol: 58 mg/dL (ref 0–99)
NonHDL: 92.31
Total CHOL/HDL Ratio: 3
Triglycerides: 172 mg/dL — ABNORMAL HIGH (ref 0.0–149.0)
VLDL: 34.4 mg/dL (ref 0.0–40.0)

## 2018-11-14 LAB — CBC WITH DIFFERENTIAL/PLATELET
Basophils Absolute: 0.1 K/uL (ref 0.0–0.1)
Basophils Relative: 0.5 % (ref 0.0–3.0)
Eosinophils Absolute: 0.2 K/uL (ref 0.0–0.7)
Eosinophils Relative: 1.7 % (ref 0.0–5.0)
HCT: 45.7 % (ref 39.0–52.0)
Hemoglobin: 15 g/dL (ref 13.0–17.0)
Lymphocytes Relative: 19.4 % (ref 12.0–46.0)
Lymphs Abs: 1.9 K/uL (ref 0.7–4.0)
MCHC: 32.9 g/dL (ref 30.0–36.0)
MCV: 89.7 fl (ref 78.0–100.0)
Monocytes Absolute: 1 K/uL (ref 0.1–1.0)
Monocytes Relative: 10.4 % (ref 3.0–12.0)
Neutro Abs: 6.8 K/uL (ref 1.4–7.7)
Neutrophils Relative %: 68 % (ref 43.0–77.0)
Platelets: 286 K/uL (ref 150.0–400.0)
RBC: 5.09 Mil/uL (ref 4.22–5.81)
RDW: 14 % (ref 11.5–15.5)
WBC: 9.9 K/uL (ref 4.0–10.5)

## 2018-11-14 LAB — HEPATIC FUNCTION PANEL
ALT: 37 U/L (ref 0–53)
AST: 28 U/L (ref 0–37)
Albumin: 5.1 g/dL (ref 3.5–5.2)
Alkaline Phosphatase: 91 U/L (ref 39–117)
Bilirubin, Direct: 0.1 mg/dL (ref 0.0–0.3)
Total Bilirubin: 0.6 mg/dL (ref 0.2–1.2)
Total Protein: 8.1 g/dL (ref 6.0–8.3)

## 2018-11-14 LAB — PSA: PSA: 6.53 ng/mL — ABNORMAL HIGH (ref 0.10–4.00)

## 2018-11-14 LAB — TSH: TSH: 1.48 u[IU]/mL (ref 0.35–4.50)

## 2018-11-14 NOTE — Addendum Note (Signed)
Addended by: Anibal Henderson on: 11/14/2018 01:28 PM   Modules accepted: Orders

## 2018-11-14 NOTE — Progress Notes (Signed)
Subjective:     Patient ID: Damon [name], male   DOB: 1960-07-04, 59 y.o.   MRN: [id number]  HPI Patient here for complete physical.  Last year about this time he had non-ST elevation MI.  He had some mild left ventricular systolic dysfunction without heart failure.  He has had a history of some Raynaud's involving the extremities.  He has hyperlipidemia currently treated with Lipitor and Zetia.  Recent addition of Zetia has not had lipids since then.  He is done reasonably well with no recent chest pains.  He has gained some weight during the past year.  On multiple medications per cardiology.  He will finish out his Brilinta by October 1.  Is on high-dose Lipitor 80 mg daily and recent addition of Zetia.  Needs follow-up lipids.  Needs flu vaccine.  Also no prior history of Pneumovax.  Has had previous shingles vaccine.  Colonoscopy up-to-date.  Tetanus up-to-date.  He has history of elevated PSA.  This is been somewhat up and down over the past few years.  He was referred to urology last year and got a second opinion and they recommended observation.  Reviewed with no changes:  Past Medical History:  Diagnosis Date  . Allergy    seasonal   . Coronary artery disease   . Depression    situational   . Heart murmur    hx of in childhood   . Hyperlipidemia   . Hypertension   . Injury of tendon of long head of right biceps    January 2016   Past Surgical History:  Procedure Laterality Date  . APPENDECTOMY    . CARDIAC CATHETERIZATION    . COLONOSCOPY N/A 11/28/2014   Procedure: COLONOSCOPY;  Surgeon: Inda Castle, MD;  Location: [facility];  Service: Endoscopy;  Laterality: N/A;  . colonscopy      removed polyps  . CORONARY STENT INTERVENTION N/A 11/20/2017   Procedure: CORONARY STENT INTERVENTION;  Surgeon: Lorretta Harp, MD;  Location: [place] [facility];  Service: Cardiovascular;  Laterality: N/A;  . INGUINAL HERNIA REPAIR Left 06/12/2014   Procedure: LEFT INGUINAL HERNIA  REPAIR WITH MESH;  Surgeon: Armandina Gemma, MD;  Location: [facility];  Service: General;  Laterality: Left;  . INSERTION OF MESH Left 06/12/2014   Procedure: INSERTION OF MESH;  Surgeon: Armandina Gemma, MD;  Location: [facility];  Service: General;  Laterality: Left;  . LEFT HEART CATH AND CORONARY ANGIOGRAPHY N/A 11/20/2017   Procedure: LEFT HEART CATH AND CORONARY ANGIOGRAPHY;  Surgeon: Lorretta Harp, MD;  Location: [place] [facility];  Service: Cardiovascular;  Laterality: N/A;  . MOUTH SURGERY     wisdom teeth removed;root canal - 03/2014  . TONSILLECTOMY  1995  . TONSILLECTOMY      reports that he quit smoking about 23 years ago. His smoking use included cigarettes. He has a 34.50 pack-year smoking history. He has never used smokeless tobacco. He reports current alcohol use. He reports that he does not use drugs. family history includes AAA (abdominal aortic aneurysm) in his mother; Alzheimer's disease in his mother; Cancer in his paternal grandfather and another family member; Depression in an other family member; Heart disease in his paternal grandfather and another family member; Hyperlipidemia in an other family member; Hypertension in an other family member; Stroke in an other family member. Allergies  Allergen Reactions  . Poison Ivy Extract [Poison Ivy Extract] Itching and Rash  . Poison Oak Extract [Poison Oak Extract] Itching  and Rash   Wt Readings from Last 3 Encounters:  11/14/18 175 lb (79.4 kg)  10/31/18 177 lb (80.3 kg)  09/19/18 177 lb (80.3 kg)    Health Maintenance  Topic Date Due  . INFLUENZA VACCINE  09/22/2018  . TETANUS/TDAP  11/10/2019  . COLONOSCOPY  11/27/2024  . Hepatitis C Screening  Completed  . HIV Screening  Completed     Review of Systems  Constitutional: Negative for activity change, appetite change, fatigue and fever.  HENT: Negative for congestion, ear pain and trouble swallowing.   Eyes: Negative for pain and visual disturbance.  Respiratory: Negative  for cough, shortness of breath and wheezing.   Cardiovascular: Negative for chest pain and palpitations.  Gastrointestinal: Negative for abdominal distention, abdominal pain, blood in stool, constipation, diarrhea, nausea, rectal pain and vomiting.  Genitourinary: Negative for dysuria, hematuria and testicular pain.  Musculoskeletal: Negative for arthralgias and joint swelling.  Skin: Negative for rash.  Neurological: Negative for dizziness, syncope and headaches.  Hematological: Negative for adenopathy.  Psychiatric/Behavioral: Negative for confusion and dysphoric mood.       Objective:   Physical Exam Constitutional:      General: He is not in acute distress.    Appearance: He is well-developed.  HENT:     Head: Normocephalic and atraumatic.     Right Ear: External ear normal.     Left Ear: External ear normal.  Eyes:     Conjunctiva/sclera: Conjunctivae normal.     Pupils: Pupils are equal, round, and reactive to light.  Neck:     Musculoskeletal: Normal range of motion and neck supple.     Thyroid: No thyromegaly.  Cardiovascular:     Rate and Rhythm: Normal rate and regular rhythm.     Heart sounds: Normal heart sounds. No murmur.  Pulmonary:     Effort: No respiratory distress.     Breath sounds: No wheezing or rales.  Abdominal:     General: Bowel sounds are normal. There is no distension.     Palpations: Abdomen is soft. There is no mass.     Tenderness: There is no abdominal tenderness. There is no guarding or rebound.  Lymphadenopathy:     Cervical: No cervical adenopathy.  Skin:    Findings: No rash.     Comments: Couple scattered seborrheic keratoses on the back.  No concerning lesions  Neurological:     Mental Status: He is alert and oriented to person, place, and time.     Cranial Nerves: No cranial nerve deficit.     Deep Tendon Reflexes: Reflexes normal.        Assessment:     Physical exam.  History of NSTEMI last year and has done relatively well  since then.  History of elevated PSA    Plan:     -Recommend flu vaccine and Pneumovax and he consents to both = Obtain follow-up labs -Continue close follow-up with cardiology -He is encouraged to continue regular exercise and try to lose some weight  Kristian Covey MD [facility]

## 2018-11-15 ENCOUNTER — Telehealth: Payer: Self-pay | Admitting: Cardiovascular Disease

## 2018-11-15 ENCOUNTER — Telehealth: Payer: Self-pay | Admitting: Family Medicine

## 2018-11-15 DIAGNOSIS — R972 Elevated prostate specific antigen [PSA]: Secondary | ICD-10-CM | POA: Diagnosis not present

## 2018-11-15 NOTE — Telephone Encounter (Signed)
New Message  Patient states that he is calling back in to follow up with Lattie Haw about getting his coding fixed on his chart.Please give patient a call back.

## 2018-11-15 NOTE — Telephone Encounter (Signed)
Message sent to Ms. Tamala Julian to help answer the patient's questions.

## 2018-11-15 NOTE — Telephone Encounter (Signed)
Pt states he reviewed results of recent lab work on MyChart this AM. Pt has multiple questions regarding results. "I'd like a full analysis." Aware Dr. Elease Hashimoto referred him to urologist. States he would like to hear from Dr. Elease Hashimoto hinself to discuss further.   CB# [phone]

## 2018-11-15 NOTE — Telephone Encounter (Signed)
Returned call to pt he states that he just wanted to let Lattie Haw know that "Damon [name]" has not called him back yet and it has been more that 30 days. Can you send her a message to call him to discuss billing issues.

## 2018-11-15 NOTE — Telephone Encounter (Signed)
[id number] 

## 2018-11-21 ENCOUNTER — Other Ambulatory Visit: Payer: Self-pay

## 2018-11-21 DIAGNOSIS — Z20822 Contact with and (suspected) exposure to covid-19: Secondary | ICD-10-CM

## 2018-11-22 LAB — NOVEL CORONAVIRUS, NAA: SARS-CoV-2, NAA: NOT DETECTED

## 2018-11-23 DIAGNOSIS — F909 Attention-deficit hyperactivity disorder, unspecified type: Secondary | ICD-10-CM | POA: Diagnosis not present

## 2018-11-24 ENCOUNTER — Other Ambulatory Visit: Payer: Self-pay | Admitting: Physician Assistant

## 2018-12-04 ENCOUNTER — Telehealth: Payer: Self-pay | Admitting: Cardiovascular Disease

## 2018-12-04 NOTE — Telephone Encounter (Signed)
New message:     Patient calling concering some some medication issues. I told the patient we need a order from the doctor for medical clearance. Please call patient.

## 2018-12-04 NOTE — Telephone Encounter (Signed)
Patient states on the 19th- he is to have an emergency biopsy for his prostate. He would like to know how long he should be off certain medications, like aspirin or if he should stop it now.  I advised patient to have that office send over the clearance as STAT so we could put it in the system and have the preop PA look over to advise of these questions. Patient verbalized understanding

## 2018-12-07 ENCOUNTER — Telehealth: Payer: Self-pay

## 2018-12-07 NOTE — Telephone Encounter (Signed)
Request for surgical clearance:  1. What type of surgery is being performed? PROSTATE BIOPSY   2. When is this surgery scheduled? 12-10-2018   3. What type of clearance is required (medical clearance vs. Pharmacy clearance to hold med vs. Both)? BOTH  4. Are there any medications that need to be held prior to surgery and how long? PT TAKES ASA AND BRILINTA   5. Practice name and name of physician performing surgery? [facility]    6. What is your office phone number  [phone]    7.   What is your office fax number  [phone]  8.   Anesthesia type (None, local, MAC, general) ? NOT LISTED

## 2018-12-10 ENCOUNTER — Telehealth: Payer: Self-pay | Admitting: *Deleted

## 2018-12-10 DIAGNOSIS — R972 Elevated prostate specific antigen [PSA]: Secondary | ICD-10-CM | POA: Diagnosis not present

## 2018-12-10 DIAGNOSIS — C61 Malignant neoplasm of prostate: Secondary | ICD-10-CM | POA: Diagnosis not present

## 2018-12-10 NOTE — Telephone Encounter (Signed)
Copied from [place] [id number]. Topic: General - Other >> Dec 10, 2018 12:47 PM Lennox Solders wrote: Reason for CRM:  PT is calling and would like dr burchette to know he had PSA biopsy today and it will take 3 to 5 business day before he get the results

## 2018-12-10 NOTE — Telephone Encounter (Signed)
Covering staff, Please clarify with surgeon office if clearance is needed or not as biopsy is scheduled for today.  Per note, patient is instructed to discontinue  Brillinta on 11/22/2018 as his PCI was 11/20/2017.

## 2018-12-10 NOTE — Telephone Encounter (Signed)
Spoke with Alfonse Spruce at [facility].  They do still need the clearance.

## 2018-12-10 NOTE — Telephone Encounter (Signed)
   Primary Cardiologist: Sanda Klein, MD  Chart reviewed as part of pre-operative protocol coverage. Given past medical history and time since last visit, based on ACC/AHA guidelines, Damon [name] would be at acceptable risk for the planned procedure without further cardiovascular testing.   He has stopped Brillinta as recommended. Patient is holding ASA for past 2 days, per protocol he can hold ASA 5-7 days prior to procedure. Resume per surgeon recommendations.   I will route this recommendation to the requesting party via Epic fax function and remove from pre-op pool.  Please call with questions.  [place], [place] 12/10/2018, 8:56 AM

## 2018-12-14 DIAGNOSIS — F909 Attention-deficit hyperactivity disorder, unspecified type: Secondary | ICD-10-CM | POA: Diagnosis not present

## 2018-12-20 ENCOUNTER — Encounter: Payer: Self-pay | Admitting: Family Medicine

## 2018-12-21 DIAGNOSIS — F909 Attention-deficit hyperactivity disorder, unspecified type: Secondary | ICD-10-CM | POA: Diagnosis not present

## 2018-12-28 DIAGNOSIS — F909 Attention-deficit hyperactivity disorder, unspecified type: Secondary | ICD-10-CM | POA: Diagnosis not present

## 2019-01-01 ENCOUNTER — Telehealth (HOSPITAL_COMMUNITY): Payer: Self-pay | Admitting: *Deleted

## 2019-01-01 NOTE — Telephone Encounter (Signed)
Attempted to call pt in response to message left regarding his "inactivity' on the [employer] App - Virtual platform for cardiac rehab.  Called number provided, no answer - mailbox full unable to leave pt message.  Will try again later. Cherre Huger, BSN Cardiac and Training and development officer

## 2019-01-09 ENCOUNTER — Telehealth (HOSPITAL_COMMUNITY): Payer: Self-pay | Admitting: *Deleted

## 2019-01-09 NOTE — Telephone Encounter (Signed)
Pt called regarding his "inactive status" on the [employer] A[[- virtual cardiac rehab. Advised pt that he was discharged from participating on the app due to inactivity for exercise.  Pt had not logged any exercise since September. Pt also advised that his participation was not for long term.  Pt event was in October 2019 and he joined the [employer] in in July.  Pt reassured that his rehab progress report was generated and scanned into his EMR for view by his cardiologist.  Pt encouraged to explore other apps for logging exercise and medication reminders.  Pt asked could he have a 30 day extension so that he could watch education videos that he was unaware were apart of the app.  Pt advised that I would confer with my supervisor upon her return to the department.  Asked pt for a good contact number.  Given the number [phone] ext [phone].  Pt thanked me for the information. Cherre Huger, BSN Cardiac and Training and development officer

## 2019-01-10 ENCOUNTER — Other Ambulatory Visit: Payer: Self-pay

## 2019-01-10 ENCOUNTER — Ambulatory Visit (INDEPENDENT_AMBULATORY_CARE_PROVIDER_SITE_OTHER): Payer: BC Managed Care – PPO | Admitting: Sports Medicine

## 2019-01-10 VITALS — BP 124/86 | Ht 68.0 in | Wt 177.0 lb

## 2019-01-10 DIAGNOSIS — M545 Low back pain, unspecified: Secondary | ICD-10-CM

## 2019-01-10 DIAGNOSIS — F900 Attention-deficit hyperactivity disorder, predominantly inattentive type: Secondary | ICD-10-CM | POA: Diagnosis not present

## 2019-01-10 MED ORDER — METHOCARBAMOL 500 MG PO TABS: 500.0000 mg | ORAL_TABLET | Freq: Every evening | ORAL | 0 refills | Status: DC | PRN

## 2019-01-11 ENCOUNTER — Encounter: Payer: Self-pay | Admitting: Sports Medicine

## 2019-01-11 DIAGNOSIS — F411 Generalized anxiety disorder: Secondary | ICD-10-CM | POA: Diagnosis not present

## 2019-01-11 DIAGNOSIS — F909 Attention-deficit hyperactivity disorder, unspecified type: Secondary | ICD-10-CM | POA: Diagnosis not present
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
## 2019-01-21 ENCOUNTER — Ambulatory Visit
Admission: RE | Admit: 2019-01-21 | Discharge: 2019-01-21 | Disposition: A | Discharge status: Home / Self Care | Payer: BC Managed Care – PPO | Source: Ambulatory Visit | Attending: Sports Medicine | Admitting: Sports Medicine

## 2019-01-21 DIAGNOSIS — M545 Low back pain, unspecified: Secondary | ICD-10-CM

## 2019-01-24 ENCOUNTER — Telehealth: Payer: Self-pay | Admitting: Sports Medicine

## 2019-01-24 NOTE — Telephone Encounter (Signed)
  I spoke with the patient on the phone today after reviewing x-ray findings of his lumbar spine.  He has a grade 1 spondylolisthesis at L5-S1 with 7 mm of anterior listhesis.  This is stable with flexion and extension.  He also has some mild to moderate degenerative disc disease at L5-S1.  Based on these findings I recommend a trial of formal physical therapy concentrating on Williams flexion exercises and avoiding extension.  I would like for the patient to follow-up with me in the office in 4 weeks for reevaluation.  If symptoms persist or worsen I would consider merits of further diagnostic imaging.

## 2019-01-25 ENCOUNTER — Telehealth: Payer: BC Managed Care – PPO | Admitting: Cardiovascular Disease

## 2019-01-25 DIAGNOSIS — F909 Attention-deficit hyperactivity disorder, unspecified type: Secondary | ICD-10-CM | POA: Diagnosis not present

## 2019-01-28 DIAGNOSIS — N400 Enlarged prostate without lower urinary tract symptoms: Secondary | ICD-10-CM | POA: Diagnosis not present

## 2019-01-29 LAB — PSA: Prostate Specific Ag, Serum: 4.5 ng/mL — ABNORMAL HIGH (ref 0.0–4.0)

## 2019-01-30 DIAGNOSIS — R262 Difficulty in walking, not elsewhere classified: Secondary | ICD-10-CM | POA: Diagnosis not present

## 2019-01-30 DIAGNOSIS — M2560 Stiffness of unspecified joint, not elsewhere classified: Secondary | ICD-10-CM | POA: Diagnosis not present

## 2019-01-30 DIAGNOSIS — M545 Low back pain: Secondary | ICD-10-CM | POA: Diagnosis not present

## 2019-01-31 ENCOUNTER — Ambulatory Visit: Payer: BC Managed Care – PPO | Admitting: Cardiovascular Disease

## 2019-01-31 ENCOUNTER — Other Ambulatory Visit: Payer: Self-pay

## 2019-01-31 ENCOUNTER — Encounter: Payer: Self-pay | Admitting: Cardiovascular Disease

## 2019-01-31 VITALS — BP 123/86 | HR 76 | Temp 97.0°F | Ht 68.5 in | Wt 180.0 lb

## 2019-01-31 DIAGNOSIS — E782 Mixed hyperlipidemia: Secondary | ICD-10-CM | POA: Diagnosis not present

## 2019-01-31 DIAGNOSIS — I10 Essential (primary) hypertension: Secondary | ICD-10-CM | POA: Diagnosis not present

## 2019-01-31 DIAGNOSIS — I519 Heart disease, unspecified: Secondary | ICD-10-CM | POA: Diagnosis not present

## 2019-01-31 DIAGNOSIS — I2510 Atherosclerotic heart disease of native coronary artery without angina pectoris: Secondary | ICD-10-CM | POA: Diagnosis not present

## 2019-01-31 DIAGNOSIS — I7300 Raynaud's syndrome without gangrene: Secondary | ICD-10-CM | POA: Diagnosis not present

## 2019-01-31 MED ORDER — VASCEPA 1 G PO CAPS: 2.0000 g | ORAL_CAPSULE | Freq: Two times a day (BID) | ORAL | 11 refills | Status: DC

## 2019-01-31 NOTE — Patient Instructions (Signed)
Medication Instructions:  STOP the Carvedilol START Vascepa 2 g twice daily (2 of the 1 gram tablets twice daily)  *If you need a refill on your cardiac medications before your next appointment, please call your pharmacy*  Lab Work: None ordered If you have labs (blood work) drawn today and your tests are completely normal, you will receive your results only by: Marland Kitchen MyChart Message (if you have MyChart) OR . A paper copy in the mail If you have any lab test that is abnormal or we need to change your treatment, we will call you to review the results.  Testing/Procedures: None ordered  Follow-Up: At [facility], you and your health needs are our priority.  As part of our continuing mission to provide you with exceptional heart care, we have created designated Provider Care Teams.  These Care Teams include your primary Cardiologist (physician) and Advanced Practice Providers (APPs -  Physician Assistants and Nurse Practitioners) who all work together to provide you with the care you need, when you need it.  Your next appointment:   12 month(s)  The format for your next appointment:   In Person  Provider:   Sanda Klein, MD

## 2019-01-31 NOTE — Progress Notes (Signed)
Cardiology Office Note:    Date:  02/01/2019   ID:  Damon [name], DOB 1960-12-29, MRN [id number]  PCP:  Eulas Post, MD  Cardiologist:  Sanda Klein, MD  Electrophysiologist:  None   Referring MD: Eulas Post, MD   Chief Complaint  Patient presents with  . Coronary Artery Disease    History of Present Illness:    Damon [name] is a 58 y.o. male with a hx of acute non-STEMI leading to placement of a drug-eluting stent in the ramus intermedius artery November 20, 2017, hyperlipidemia, hypertension, Raynaud's syndrome.  He is doing very well from a cardiac point of view.  He is exercising on a [employer] bike and has not had issues with angina or dyspnea.  He is upset that he can only get his heart rate up to about 150 bpm.  His Raynaud's syndrome is clearly worse this year, even after we stopped metoprolol and switched to carvedilol.  He denies exertional dyspnea, palpitations, dizziness, syncope, leg edema or intermittent claudication.  He has had some issues with lumbar spine pain related to L2 spondylolisthesis.    He stopped Brilinta in September and this has improved his breathing complaints.  In September his LDL cholesterol was 58 and his triglycerides were borderline at 172. He asked about the benefits of Vascepa.  His mother passed away run around [employer].  Echo at presentation in September 2019 showed LVEF 45-50% with lateral wall motion abnormalities.  Follow-up echo on December 5 shows resolution of the regional abnormalities with an EF of 50-55%.  His PSA is borderline at about 4.5.  This increased transiently to 10 but is back to 4.5.  Past Medical History:  Diagnosis Date  . Allergy    seasonal   . Coronary artery disease   . Depression    situational   . Heart murmur    hx of in childhood   . Hyperlipidemia   . Hypertension   . Injury of tendon of long head of right biceps    January 2016    Past Surgical History:  Procedure Laterality Date   . APPENDECTOMY    . CARDIAC CATHETERIZATION    . COLONOSCOPY N/A 11/28/2014   Procedure: COLONOSCOPY;  Surgeon: Inda Castle, MD;  Location: [facility];  Service: Endoscopy;  Laterality: N/A;  . colonscopy      removed polyps  . CORONARY STENT INTERVENTION N/A 11/20/2017   Procedure: CORONARY STENT INTERVENTION;  Surgeon: Lorretta Harp, MD;  Location: [place] [facility];  Service: Cardiovascular;  Laterality: N/A;  . INGUINAL HERNIA REPAIR Left 06/12/2014   Procedure: LEFT INGUINAL HERNIA REPAIR WITH MESH;  Surgeon: Armandina Gemma, MD;  Location: [facility];  Service: General;  Laterality: Left;  . INSERTION OF MESH Left 06/12/2014   Procedure: INSERTION OF MESH;  Surgeon: Armandina Gemma, MD;  Location: [facility];  Service: General;  Laterality: Left;  . LEFT HEART CATH AND CORONARY ANGIOGRAPHY N/A 11/20/2017   Procedure: LEFT HEART CATH AND CORONARY ANGIOGRAPHY;  Surgeon: Lorretta Harp, MD;  Location: [place] [facility];  Service: Cardiovascular;  Laterality: N/A;  . MOUTH SURGERY     wisdom teeth removed;root canal - 03/2014  . TONSILLECTOMY  1995  . TONSILLECTOMY      Current Medications: Current Meds  Medication Sig  . aspirin EC 81 MG EC tablet Take 1 tablet (81 mg total) by mouth daily.  Marland Kitchen atorvastatin (LIPITOR) 80 MG tablet TAKE 1 TABLET ONCE  DAILY AT 6PM FOR CHOLESTEROL  . cetirizine (ZYRTEC) 10 MG tablet Take 10 mg by mouth 2 (two) times daily as needed.   . clonazePAM (KLONOPIN) 0.5 MG tablet Take 0.5-1 mg by mouth See admin instructions. Take 2 tablets in the morning and 1 tablet in the evening. May take 1 additional tablet as needed for anxiety  . ezetimibe (ZETIA) 10 MG tablet Take 1 tablet (10 mg total) by mouth daily.  Marland Kitchen. lisdexamfetamine (VYVANSE) 30 MG capsule Take 30 mg by mouth as needed.   Marland Kitchen. lisinopril (ZESTRIL) 20 MG tablet TAKE 1 TABLET ONCE DAILY.  . methocarbamol (ROBAXIN) 500 MG tablet Take 1 tablet (500 mg total) by mouth at bedtime as needed.  . nitroGLYCERIN  (NITROSTAT) 0.4 MG SL tablet Place 1 tablet (0.4 mg total) under the tongue every 5 (five) minutes x 3 doses as needed for chest pain.  . [DISCONTINUED] carvedilol (COREG) 3.125 MG tablet Take 1 tablet (3.125 mg total) by mouth 2 (two) times daily.     Allergies:   Poison ivy extract [poison ivy extract] and Poison oak extract [poison oak extract]   Social History   Socioeconomic History  . Marital status: Married    Spouse name: Not on file  . Number of children: Not on file  . Years of education: Not on file  . Highest education level: Professional school degree (e.g., MD, DDS, DVM, JD)  Occupational History  . Not on file  Tobacco Use  . Smoking status: Former Smoker    Packs/day: 1.50    Years: 23.00    Pack years: 34.50    Types: Cigarettes    Quit date: 02/22/1995    Years since quitting: 23.9  . Smokeless tobacco: Never Used  Substance and Sexual Activity  . Alcohol use: Yes    Comment: occas  . Drug use: No  . Sexual activity: Not on file  Other Topics Concern  . Not on file  Social History Narrative  . Not on file   Social Determinants of Health   Financial Resource Strain: Low Risk   . Difficulty of Paying Living Expenses: Not hard at all  Food Insecurity: No Food Insecurity  . Worried About Programme researcher, broadcasting/film/videounning Out of Food in the Last Year: Never true  . Ran Out of Food in the Last Year: Never true  Transportation Needs: No Transportation Needs  . Lack of Transportation (Medical): No  . Lack of Transportation (Non-Medical): No  Physical Activity: Insufficiently Active  . Days of Exercise per Week: 4 days  . Minutes of Exercise per Session: 30 min  Stress: Stress Concern Present  . Feeling of Stress : Rather much  Social Connections:   . Frequency of Communication with Friends and Family: Not on file  . Frequency of Social Gatherings with Friends and Family: Not on file  . Attends Religious Services: Not on file  . Active Member of Clubs or Organizations: Not on file   . Attends BankerClub or Organization Meetings: Not on file  . Marital Status: Not on file     Family History: The patient's family history includes AAA (abdominal aortic aneurysm) in his mother; Alzheimer's disease in his mother; Cancer in his paternal grandfather and another family member; Depression in an other family member; Heart disease in his paternal grandfather and another family member; Hyperlipidemia in an other family member; Hypertension in an other family member; Stroke in an other family member.  ROS:   Please see the history of present illness.  All other systems are reviewed and are negative.   EKGs/Labs/Other Studies Reviewed:    The following studies were reviewed today: Coronary angiograms, echo results from January 25, 2018  EKG:  EKG is ordered today.  It shows normal sinus rhythm and is a completely normal tracing.  Recent Labs: 11/14/2018: ALT 37; BUN 19; Creatinine, Ser 0.95; Hemoglobin 15.0; Platelets 286.0; Potassium 5.2 No hemolysis seen; Sodium 135; TSH 1.48  Recent Lipid Panel    Component Value Date/Time   CHOL 131 01/28/2019 1624   TRIG 115 01/28/2019 1624   HDL 40 01/28/2019 1624   CHOLHDL 3 11/14/2018 1013   VLDL 34.4 11/14/2018 1013   LDLCALC 70 01/28/2019 1624   LDLDIRECT 93.0 12/09/2015 0820   11/14/2018 Total cholesterol 137, HDL 45, LDL 58, triglycerides 172 Hemoglobin 15, creatinine 0.95, normal liver function tests and TSH  Physical Exam:    VS:  BP 123/86   Pulse 76   Temp (!) 97 F (36.1 C)   Ht 5' 8.5" (1.74 m)   Wt 180 lb (81.6 kg)   SpO2 99%   BMI 26.97 kg/m     Wt Readings from Last 3 Encounters:  01/31/19 180 lb (81.6 kg)  01/10/19 177 lb (80.3 kg)  11/14/18 175 lb (79.4 kg)      General: Alert, oriented x3, no distress, appears fit but also mildly overweight Head: no evidence of trauma, PERRL, EOMI, no exophtalmos or lid lag, no myxedema, no xanthelasma; normal ears, nose and oropharynx Neck: normal jugular venous  pulsations and no hepatojugular reflux; brisk carotid pulses without delay and no carotid bruits Chest: clear to auscultation, no signs of consolidation by percussion or palpation, normal fremitus, symmetrical and full respiratory excursions Cardiovascular: normal position and quality of the apical impulse, regular rhythm, normal first and second heart sounds, no murmurs, rubs or gallops Abdomen: no tenderness or distention, no masses by palpation, no abnormal pulsatility or arterial bruits, normal bowel sounds, no hepatosplenomegaly Extremities: no clubbing, cyanosis or edema; 2+ radial, ulnar and brachial pulses bilaterally; 2+ right femoral, posterior tibial and dorsalis pedis pulses; 2+ left femoral, posterior tibial and dorsalis pedis pulses; no subclavian or femoral bruits Neurological: grossly nonfocal Psych: Normal mood and affect   ASSESSMENT:    1. Coronary artery disease involving native coronary artery of native heart without angina pectoris   2. Left ventricular systolic dysfunction   3. Mixed hyperlipidemia   4. Raynaud's disease without gangrene   5. Essential hypertension    PLAN:    In order of problems listed above:  1. CAD: Asymptomatic.  He presented with non-STEMI in September 2019 and received a drug-eluting stent.  Brilinta has been stopped.  Continue aspirin and statin.  Intolerant of beta-blockers due to Raynaud's syndrome. 2. LV dysfunction: Transient LV dysfunction due to stunning has almost completely returned to baseline.  Continue ACE inhibitor but will stop the carvedilol due to Raynaud's syndrome. 3. HLP: LDL at target.  HDL mediocre but acceptable.  Borderline elevated triglycerides.  Because of his increased risk for future coronary and other vascular events I think he is an excellent candidate for Vascepa, triglycerides are not severely elevated. 4. Raynaud's syndrome: Stop beta-blocker. 5. HTN: Call us back if his blood pressure increases excessively after  we stop the carvedilol, although I doubt that this small dose will have big impact    Medication Adjustments/Labs and Tests Ordered: Current medicines are reviewed at length with the patient today.  Concerns regarding medicines are outlined  above.  Orders Placed This Encounter  Procedures  . EKG 12-Lead   Meds ordered this encounter  Medications  . icosapent Ethyl (VASCEPA) 1 g capsule    Sig: Take 2 capsules (2 g total) by mouth 2 (two) times daily.    Dispense:  120 capsule    Refill:  11    Patient Instructions  Medication Instructions:  STOP the Carvedilol START Vascepa 2 g twice daily (2 of the 1 gram tablets twice daily)  *If you need a refill on your cardiac medications before your next appointment, please call your pharmacy*  Lab Work: None ordered If you have labs (blood work) drawn today and your tests are completely normal, you will receive your results only by: Marland Kitchen MyChart Message (if you have MyChart) OR . A paper copy in the mail If you have any lab test that is abnormal or we need to change your treatment, we will call you to review the results.  Testing/Procedures: None ordered  Follow-Up: At [facility], you and your health needs are our priority.  As part of our continuing mission to provide you with exceptional heart care, we have created designated Provider Care Teams.  These Care Teams include your primary Cardiologist (physician) and Advanced Practice Providers (APPs -  Physician Assistants and Nurse Practitioners) who all work together to provide you with the care you need, when you need it.  Your next appointment:   12 month(s)  The format for your next appointment:   In Person  Provider:   Thurmon Fair, MD      Signed, Thurmon Fair, MD  02/01/2019 4:19 PM    [facility]

## 2019-02-01 ENCOUNTER — Encounter: Payer: Self-pay | Admitting: Cardiovascular Disease

## 2019-02-01 DIAGNOSIS — M2560 Stiffness of unspecified joint, not elsewhere classified: Secondary | ICD-10-CM | POA: Diagnosis not present

## 2019-02-01 DIAGNOSIS — R262 Difficulty in walking, not elsewhere classified: Secondary | ICD-10-CM | POA: Diagnosis not present

## 2019-02-01 DIAGNOSIS — M545 Low back pain: Secondary | ICD-10-CM | POA: Diagnosis not present

## 2019-02-04 DIAGNOSIS — M2560 Stiffness of unspecified joint, not elsewhere classified: Secondary | ICD-10-CM | POA: Diagnosis not present

## 2019-02-04 DIAGNOSIS — R262 Difficulty in walking, not elsewhere classified: Secondary | ICD-10-CM | POA: Diagnosis not present

## 2019-02-04 DIAGNOSIS — M545 Low back pain: Secondary | ICD-10-CM | POA: Diagnosis not present

## 2019-02-05 DIAGNOSIS — M2560 Stiffness of unspecified joint, not elsewhere classified: Secondary | ICD-10-CM | POA: Diagnosis not present

## 2019-02-05 DIAGNOSIS — M545 Low back pain: Secondary | ICD-10-CM | POA: Diagnosis not present

## 2019-02-05 DIAGNOSIS — R262 Difficulty in walking, not elsewhere classified: Secondary | ICD-10-CM | POA: Diagnosis not present

## 2019-02-09 LAB — LIPID PANEL W/O CHOL/HDL RATIO
Cholesterol, Total: 131 mg/dL (ref 100–199)
HDL: 40 mg/dL (ref >39)
LDL Chol Calc (NIH): 70 mg/dL (ref 0–99)
Triglycerides: 115 mg/dL (ref 0–149)
VLDL Cholesterol Cal: 21 mg/dL (ref 5–40)

## 2019-02-09 LAB — SPECIMEN STATUS REPORT

## 2019-02-11 ENCOUNTER — Other Ambulatory Visit: Payer: Self-pay | Admitting: Cardiovascular Disease

## 2019-02-11 NOTE — Telephone Encounter (Signed)
Rx(s) sent to pharmacy electronically.  

## 2019-02-12 ENCOUNTER — Ambulatory Visit: Payer: BC Managed Care – PPO | Attending: Internal Medicine

## 2019-02-12 DIAGNOSIS — Z20828 Contact with and (suspected) exposure to other viral communicable diseases: Secondary | ICD-10-CM | POA: Diagnosis not present

## 2019-02-12 DIAGNOSIS — R262 Difficulty in walking, not elsewhere classified: Secondary | ICD-10-CM | POA: Diagnosis not present

## 2019-02-12 DIAGNOSIS — M2560 Stiffness of unspecified joint, not elsewhere classified: Secondary | ICD-10-CM | POA: Diagnosis not present

## 2019-02-12 DIAGNOSIS — Z20822 Contact with and (suspected) exposure to covid-19: Secondary | ICD-10-CM

## 2019-02-12 DIAGNOSIS — M545 Low back pain: Secondary | ICD-10-CM | POA: Diagnosis not present

## 2019-02-13 LAB — NOVEL CORONAVIRUS, NAA: SARS-CoV-2, NAA: NOT DETECTED

## 2019-02-14 ENCOUNTER — Telehealth: Payer: Self-pay | Admitting: Family Medicine

## 2019-02-14 NOTE — Telephone Encounter (Signed)
Pt is aware covid 19 test is neg on 02/14/2019 

## 2019-02-20 ENCOUNTER — Other Ambulatory Visit: Payer: Self-pay

## 2019-02-20 MED ORDER — NITROGLYCERIN 0.4 MG SL SUBL: 0.4000 mg | SUBLINGUAL_TABLET | SUBLINGUAL | 1 refills | Status: DC | PRN

## 2019-02-20 MED ORDER — EZETIMIBE 10 MG PO TABS: 10.0000 mg | ORAL_TABLET | Freq: Every day | ORAL | 3 refills | Status: DC

## 2019-02-21 ENCOUNTER — Telehealth: Payer: Self-pay | Admitting: *Deleted

## 2019-02-21 DIAGNOSIS — F909 Attention-deficit hyperactivity disorder, unspecified type: Secondary | ICD-10-CM | POA: Diagnosis not present

## 2019-02-21 DIAGNOSIS — F4320 Adjustment disorder, unspecified: Secondary | ICD-10-CM | POA: Diagnosis not present

## 2019-02-21 DIAGNOSIS — M2560 Stiffness of unspecified joint, not elsewhere classified: Secondary | ICD-10-CM | POA: Diagnosis not present

## 2019-02-21 DIAGNOSIS — M545 Low back pain: Secondary | ICD-10-CM | POA: Diagnosis not present

## 2019-02-21 DIAGNOSIS — R262 Difficulty in walking, not elsewhere classified: Secondary | ICD-10-CM | POA: Diagnosis not present

## 2019-02-21 NOTE — Telephone Encounter (Signed)
Lynette with [employer] called back stating PA was denied. Their phone number is [phone] if there are any questions.

## 2019-02-21 NOTE — Telephone Encounter (Signed)
Vascepa prior authorization has been sent through CoverMyMeds.  Key code: [id number]

## 2019-02-25 ENCOUNTER — Other Ambulatory Visit: Payer: Self-pay | Admitting: Sports Medicine

## 2019-02-25 ENCOUNTER — Other Ambulatory Visit: Payer: Self-pay | Admitting: *Deleted

## 2019-02-25 MED ORDER — METHOCARBAMOL 500 MG PO TABS: 500.0000 mg | ORAL_TABLET | Freq: Every evening | ORAL | 0 refills | Status: DC | PRN

## 2019-02-27 DIAGNOSIS — M2560 Stiffness of unspecified joint, not elsewhere classified: Secondary | ICD-10-CM | POA: Diagnosis not present

## 2019-02-27 DIAGNOSIS — R262 Difficulty in walking, not elsewhere classified: Secondary | ICD-10-CM | POA: Diagnosis not present

## 2019-02-27 DIAGNOSIS — M545 Low back pain: Secondary | ICD-10-CM | POA: Diagnosis not present

## 2019-02-28 DIAGNOSIS — R262 Difficulty in walking, not elsewhere classified: Secondary | ICD-10-CM | POA: Diagnosis not present

## 2019-02-28 DIAGNOSIS — M545 Low back pain: Secondary | ICD-10-CM | POA: Diagnosis not present

## 2019-02-28 DIAGNOSIS — M2560 Stiffness of unspecified joint, not elsewhere classified: Secondary | ICD-10-CM | POA: Diagnosis not present

## 2019-03-01 DIAGNOSIS — F909 Attention-deficit hyperactivity disorder, unspecified type: Secondary | ICD-10-CM | POA: Diagnosis not present

## 2019-03-05 DIAGNOSIS — R262 Difficulty in walking, not elsewhere classified: Secondary | ICD-10-CM | POA: Diagnosis not present

## 2019-03-05 DIAGNOSIS — M2560 Stiffness of unspecified joint, not elsewhere classified: Secondary | ICD-10-CM | POA: Diagnosis not present

## 2019-03-05 DIAGNOSIS — M545 Low back pain: Secondary | ICD-10-CM | POA: Diagnosis not present

## 2019-03-06 ENCOUNTER — Encounter: Payer: Self-pay | Admitting: Family Medicine

## 2019-03-11 DIAGNOSIS — M545 Low back pain: Secondary | ICD-10-CM | POA: Diagnosis not present

## 2019-03-11 DIAGNOSIS — R262 Difficulty in walking, not elsewhere classified: Secondary | ICD-10-CM | POA: Diagnosis not present

## 2019-03-11 DIAGNOSIS — M2560 Stiffness of unspecified joint, not elsewhere classified: Secondary | ICD-10-CM | POA: Diagnosis not present

## 2019-03-15 DIAGNOSIS — F909 Attention-deficit hyperactivity disorder, unspecified type: Secondary | ICD-10-CM | POA: Diagnosis not present

## 2019-03-15 DIAGNOSIS — F4320 Adjustment disorder, unspecified: Secondary | ICD-10-CM | POA: Diagnosis not present

## 2019-03-18 DIAGNOSIS — R262 Difficulty in walking, not elsewhere classified: Secondary | ICD-10-CM | POA: Diagnosis not present

## 2019-03-18 DIAGNOSIS — M2560 Stiffness of unspecified joint, not elsewhere classified: Secondary | ICD-10-CM | POA: Diagnosis not present

## 2019-03-18 DIAGNOSIS — M545 Low back pain: Secondary | ICD-10-CM | POA: Diagnosis not present

## 2019-03-20 DIAGNOSIS — R262 Difficulty in walking, not elsewhere classified: Secondary | ICD-10-CM | POA: Diagnosis not present

## 2019-03-20 DIAGNOSIS — M2560 Stiffness of unspecified joint, not elsewhere classified: Secondary | ICD-10-CM | POA: Diagnosis not present

## 2019-03-20 DIAGNOSIS — M545 Low back pain: Secondary | ICD-10-CM | POA: Diagnosis not present

## 2019-03-21 ENCOUNTER — Encounter: Payer: Self-pay | Admitting: *Deleted

## 2019-03-22 ENCOUNTER — Other Ambulatory Visit: Payer: Self-pay | Admitting: *Deleted

## 2019-03-22 MED ORDER — VASCEPA 1 G PO CAPS: 2.0000 g | ORAL_CAPSULE | Freq: Two times a day (BID) | ORAL | 11 refills | Status: DC

## 2019-03-25 ENCOUNTER — Other Ambulatory Visit: Payer: Self-pay

## 2019-03-25 DIAGNOSIS — M545 Low back pain: Secondary | ICD-10-CM | POA: Diagnosis not present

## 2019-03-25 DIAGNOSIS — R262 Difficulty in walking, not elsewhere classified: Secondary | ICD-10-CM | POA: Diagnosis not present

## 2019-03-25 DIAGNOSIS — M2560 Stiffness of unspecified joint, not elsewhere classified: Secondary | ICD-10-CM | POA: Diagnosis not present

## 2019-03-26 ENCOUNTER — Telehealth: Payer: Self-pay | Admitting: *Deleted

## 2019-03-26 NOTE — Telephone Encounter (Signed)
Vascepa prior auth resubmitted under the brand name per the request of the insurance company.  Key Code: [id number]

## 2019-03-27 DIAGNOSIS — M2560 Stiffness of unspecified joint, not elsewhere classified: Secondary | ICD-10-CM | POA: Diagnosis not present

## 2019-03-27 DIAGNOSIS — M545 Low back pain: Secondary | ICD-10-CM | POA: Diagnosis not present

## 2019-03-27 DIAGNOSIS — R262 Difficulty in walking, not elsewhere classified: Secondary | ICD-10-CM | POA: Diagnosis not present

## 2019-03-27 NOTE — Telephone Encounter (Signed)
The patient's PA for Vascepa has been denied due to the patient not having Triglcerides over 500 or between 135-499 without medications.

## 2019-03-29 DIAGNOSIS — F4320 Adjustment disorder, unspecified: Secondary | ICD-10-CM | POA: Diagnosis not present

## 2019-03-29 DIAGNOSIS — F909 Attention-deficit hyperactivity disorder, unspecified type: Secondary | ICD-10-CM | POA: Diagnosis not present

## 2019-04-01 DIAGNOSIS — R262 Difficulty in walking, not elsewhere classified: Secondary | ICD-10-CM | POA: Diagnosis not present

## 2019-04-01 DIAGNOSIS — M545 Low back pain: Secondary | ICD-10-CM | POA: Diagnosis not present

## 2019-04-01 DIAGNOSIS — M2560 Stiffness of unspecified joint, not elsewhere classified: Secondary | ICD-10-CM | POA: Diagnosis not present

## 2019-04-01 DIAGNOSIS — F900 Attention-deficit hyperactivity disorder, predominantly inattentive type: Secondary | ICD-10-CM | POA: Diagnosis not present

## 2019-04-11 DIAGNOSIS — F411 Generalized anxiety disorder: Secondary | ICD-10-CM | POA: Diagnosis not present

## 2019-04-12 DIAGNOSIS — M2560 Stiffness of unspecified joint, not elsewhere classified: Secondary | ICD-10-CM | POA: Diagnosis not present

## 2019-04-12 DIAGNOSIS — M25512 Pain in left shoulder: Secondary | ICD-10-CM | POA: Diagnosis not present

## 2019-04-12 DIAGNOSIS — M545 Low back pain: Secondary | ICD-10-CM | POA: Diagnosis not present

## 2019-04-12 DIAGNOSIS — R262 Difficulty in walking, not elsewhere classified: Secondary | ICD-10-CM | POA: Diagnosis not present

## 2019-04-15 DIAGNOSIS — N401 Enlarged prostate with lower urinary tract symptoms: Secondary | ICD-10-CM | POA: Diagnosis not present

## 2019-04-15 DIAGNOSIS — R972 Elevated prostate specific antigen [PSA]: Secondary | ICD-10-CM | POA: Diagnosis not present

## 2019-04-15 DIAGNOSIS — N138 Other obstructive and reflux uropathy: Secondary | ICD-10-CM | POA: Diagnosis not present

## 2019-04-16 ENCOUNTER — Ambulatory Visit (INDEPENDENT_AMBULATORY_CARE_PROVIDER_SITE_OTHER): Payer: BC Managed Care – PPO | Admitting: Sports Medicine

## 2019-04-16 ENCOUNTER — Other Ambulatory Visit: Payer: Self-pay

## 2019-04-16 VITALS — BP 124/82 | Ht 68.0 in | Wt 177.0 lb

## 2019-04-16 DIAGNOSIS — M25512 Pain in left shoulder: Secondary | ICD-10-CM | POA: Diagnosis not present

## 2019-04-16 DIAGNOSIS — M545 Low back pain: Secondary | ICD-10-CM | POA: Diagnosis not present

## 2019-04-16 DIAGNOSIS — M4317 Spondylolisthesis, lumbosacral region: Secondary | ICD-10-CM

## 2019-04-16 DIAGNOSIS — M2560 Stiffness of unspecified joint, not elsewhere classified: Secondary | ICD-10-CM | POA: Diagnosis not present

## 2019-04-16 DIAGNOSIS — R262 Difficulty in walking, not elsewhere classified: Secondary | ICD-10-CM | POA: Diagnosis not present

## 2019-04-17 NOTE — Progress Notes (Signed)
Patient ID: Damon [name], male   DOB: 10-29-1960, 59 y.o.   MRN: [id number]  [name] comes in today for follow-up on low back pain secondary to L5-S1 spondylolisthesis and mild to moderate degenerative disc disease.  Overall, he has noticed a great benefit from physical therapy.  Although he is not completely pain-free he has noticed improvement.  He is still attending physical therapy once a week.  Physical exam was not repeated today.  We simply talked about his ongoing low back pain.  I would like for him to continue working with physical therapy until ready for discharge with a home exercise program.  Flexion and extension views show the grade 1 spondylolisthesis at L5-S1 to be stable.  As long as his symptoms are tolerable and he does not experience any radicular pain in his legs then I do not think we need to pursue further work-up at this time.  However, if symptoms affect his quality of life or he develops radiculopathy then I would consider merits of MRI to rule out spinal stenosis.  Follow-up as needed.  Total time spent with the patient was 15 minutes with greater than 50% of the time spent in face-to-face consultation discussing his low back pain and treatment going forward.

## 2019-04-18 DIAGNOSIS — F4320 Adjustment disorder, unspecified: Secondary | ICD-10-CM | POA: Diagnosis not present

## 2019-04-18 DIAGNOSIS — F900 Attention-deficit hyperactivity disorder, predominantly inattentive type: Secondary | ICD-10-CM | POA: Diagnosis not present

## 2019-04-21 ENCOUNTER — Other Ambulatory Visit: Payer: Self-pay

## 2019-04-21 ENCOUNTER — Ambulatory Visit: Payer: BC Managed Care – PPO | Attending: Internal Medicine

## 2019-04-21 DIAGNOSIS — Z23 Encounter for immunization: Secondary | ICD-10-CM | POA: Insufficient documentation

## 2019-04-21 NOTE — Progress Notes (Signed)
   Covid-19 Vaccination Clinic  Name:  Damon [name]    MRN: [id number] DOB: Jan 27, 1961  04/21/2019  Damon [name] was observed post Covid-19 immunization for 15 minutes without incidence. He was provided with Vaccine Information Sheet and instruction to access the V-Safe system.   Damon [name] was instructed to call 911 with any severe reactions post vaccine: Marland Kitchen Difficulty breathing  . Swelling of your face and throat  . A fast heartbeat  . A bad rash all over your body  . Dizziness and weakness    Immunizations Administered    Name Date Dose VIS Date Route   Pfizer COVID-19 Vaccine 04/21/2019 11:34 AM 0.3 mL 02/01/2019 Intramuscular   Manufacturer: [employer], [employer]   Lot: [id number]   NDC: [address]-2

## 2019-04-23 DIAGNOSIS — M545 Low back pain: Secondary | ICD-10-CM | POA: Diagnosis not present

## 2019-04-23 DIAGNOSIS — M2560 Stiffness of unspecified joint, not elsewhere classified: Secondary | ICD-10-CM | POA: Diagnosis not present

## 2019-04-23 DIAGNOSIS — R262 Difficulty in walking, not elsewhere classified: Secondary | ICD-10-CM | POA: Diagnosis not present

## 2019-04-23 DIAGNOSIS — M25512 Pain in left shoulder: Secondary | ICD-10-CM | POA: Diagnosis not present

## 2019-04-30 DIAGNOSIS — R262 Difficulty in walking, not elsewhere classified: Secondary | ICD-10-CM | POA: Diagnosis not present

## 2019-04-30 DIAGNOSIS — M545 Low back pain: Secondary | ICD-10-CM | POA: Diagnosis not present

## 2019-04-30 DIAGNOSIS — M25512 Pain in left shoulder: Secondary | ICD-10-CM | POA: Diagnosis not present

## 2019-04-30 DIAGNOSIS — M2560 Stiffness of unspecified joint, not elsewhere classified: Secondary | ICD-10-CM | POA: Diagnosis not present

## 2019-05-02 DIAGNOSIS — F900 Attention-deficit hyperactivity disorder, predominantly inattentive type: Secondary | ICD-10-CM | POA: Diagnosis not present

## 2019-05-02 DIAGNOSIS — F4320 Adjustment disorder, unspecified: Secondary | ICD-10-CM | POA: Diagnosis not present

## 2019-05-09 DIAGNOSIS — M545 Low back pain: Secondary | ICD-10-CM | POA: Diagnosis not present

## 2019-05-09 DIAGNOSIS — R262 Difficulty in walking, not elsewhere classified: Secondary | ICD-10-CM | POA: Diagnosis not present

## 2019-05-09 DIAGNOSIS — M2560 Stiffness of unspecified joint, not elsewhere classified: Secondary | ICD-10-CM | POA: Diagnosis not present

## 2019-05-09 DIAGNOSIS — M25512 Pain in left shoulder: Secondary | ICD-10-CM | POA: Diagnosis not present

## 2019-05-14 ENCOUNTER — Ambulatory Visit: Payer: BC Managed Care – PPO | Attending: Internal Medicine

## 2019-05-14 ENCOUNTER — Telehealth: Payer: Self-pay | Admitting: Cardiovascular Disease

## 2019-05-14 DIAGNOSIS — Z23 Encounter for immunization: Secondary | ICD-10-CM

## 2019-05-14 NOTE — Progress Notes (Signed)
   Covid-19 Vaccination Clinic  Name:  SHADOW SCHEDLER    MRN: [id number] DOB: 10/08/1960  05/14/2019  Mr. Abdullah was observed post Covid-19 immunization for 15 minutes without incident. He was provided with Vaccine Information Sheet and instruction to access the V-Safe system.   Mr. Adinolfi was instructed to call 911 with any severe reactions post vaccine: Marland Kitchen Difficulty breathing  . Swelling of face and throat  . A fast heartbeat  . A bad rash all over body  . Dizziness and weakness   Immunizations Administered    Name Date Dose VIS Date Route   Pfizer COVID-19 Vaccine 05/14/2019 10:12 AM 0.3 mL 02/01/2019 Intramuscular   Manufacturer: [employer], [employer]   Lot: [id number]   NDC: [address]-8

## 2019-05-14 NOTE — Telephone Encounter (Signed)
The patient has been made aware that his friend can contact his prescriber of the Brilinta for assistance or his friend can go on [email or web address]. He has verbalized his understanding.

## 2019-05-14 NOTE — Telephone Encounter (Signed)
Patient called and wanted to know if Dr. Salena Saner had any Discount cards for [employer]. The patient has a friend who was just put on it but is having difficulties paying for it. The patient just wants to help is friend get help by getting the discount card

## 2019-05-15 ENCOUNTER — Ambulatory Visit: Payer: BC Managed Care – PPO

## 2019-05-16 DIAGNOSIS — F900 Attention-deficit hyperactivity disorder, predominantly inattentive type: Secondary | ICD-10-CM | POA: Diagnosis not present

## 2019-05-17 DIAGNOSIS — M25512 Pain in left shoulder: Secondary | ICD-10-CM | POA: Diagnosis not present

## 2019-05-17 DIAGNOSIS — M2560 Stiffness of unspecified joint, not elsewhere classified: Secondary | ICD-10-CM | POA: Diagnosis not present

## 2019-05-17 DIAGNOSIS — M545 Low back pain: Secondary | ICD-10-CM | POA: Diagnosis not present

## 2019-05-17 DIAGNOSIS — R262 Difficulty in walking, not elsewhere classified: Secondary | ICD-10-CM | POA: Diagnosis not present

## 2019-05-20 DIAGNOSIS — R262 Difficulty in walking, not elsewhere classified: Secondary | ICD-10-CM | POA: Diagnosis not present

## 2019-05-20 DIAGNOSIS — M545 Low back pain: Secondary | ICD-10-CM | POA: Diagnosis not present

## 2019-05-20 DIAGNOSIS — M2560 Stiffness of unspecified joint, not elsewhere classified: Secondary | ICD-10-CM | POA: Diagnosis not present

## 2019-05-20 DIAGNOSIS — M25512 Pain in left shoulder: Secondary | ICD-10-CM | POA: Diagnosis not present

## 2019-05-21 ENCOUNTER — Ambulatory Visit: Payer: BC Managed Care – PPO

## 2019-05-30 DIAGNOSIS — F4320 Adjustment disorder, unspecified: Secondary | ICD-10-CM | POA: Diagnosis not present

## 2019-05-30 DIAGNOSIS — M545 Low back pain: Secondary | ICD-10-CM | POA: Diagnosis not present

## 2019-05-30 DIAGNOSIS — M25512 Pain in left shoulder: Secondary | ICD-10-CM | POA: Diagnosis not present

## 2019-05-30 DIAGNOSIS — M2560 Stiffness of unspecified joint, not elsewhere classified: Secondary | ICD-10-CM | POA: Diagnosis not present

## 2019-05-30 DIAGNOSIS — R262 Difficulty in walking, not elsewhere classified: Secondary | ICD-10-CM | POA: Diagnosis not present

## 2019-05-30 DIAGNOSIS — F900 Attention-deficit hyperactivity disorder, predominantly inattentive type: Secondary | ICD-10-CM | POA: Diagnosis not present

## 2019-06-06 DIAGNOSIS — M2560 Stiffness of unspecified joint, not elsewhere classified: Secondary | ICD-10-CM | POA: Diagnosis not present

## 2019-06-06 DIAGNOSIS — M545 Low back pain: Secondary | ICD-10-CM | POA: Diagnosis not present

## 2019-06-06 DIAGNOSIS — M25512 Pain in left shoulder: Secondary | ICD-10-CM | POA: Diagnosis not present

## 2019-06-06 DIAGNOSIS — R262 Difficulty in walking, not elsewhere classified: Secondary | ICD-10-CM | POA: Diagnosis not present

## 2019-06-14 DIAGNOSIS — M2560 Stiffness of unspecified joint, not elsewhere classified: Secondary | ICD-10-CM | POA: Diagnosis not present

## 2019-06-14 DIAGNOSIS — R262 Difficulty in walking, not elsewhere classified: Secondary | ICD-10-CM | POA: Diagnosis not present

## 2019-06-14 DIAGNOSIS — M25512 Pain in left shoulder: Secondary | ICD-10-CM | POA: Diagnosis not present

## 2019-06-14 DIAGNOSIS — M545 Low back pain: Secondary | ICD-10-CM | POA: Diagnosis not present

## 2019-06-20 DIAGNOSIS — R262 Difficulty in walking, not elsewhere classified: Secondary | ICD-10-CM | POA: Diagnosis not present

## 2019-06-20 DIAGNOSIS — M2560 Stiffness of unspecified joint, not elsewhere classified: Secondary | ICD-10-CM | POA: Diagnosis not present

## 2019-06-20 DIAGNOSIS — M545 Low back pain: Secondary | ICD-10-CM | POA: Diagnosis not present

## 2019-06-20 DIAGNOSIS — M25512 Pain in left shoulder: Secondary | ICD-10-CM | POA: Diagnosis not present

## 2019-06-27 DIAGNOSIS — R262 Difficulty in walking, not elsewhere classified: Secondary | ICD-10-CM | POA: Diagnosis not present

## 2019-06-27 DIAGNOSIS — M545 Low back pain: Secondary | ICD-10-CM | POA: Diagnosis not present

## 2019-06-27 DIAGNOSIS — M25512 Pain in left shoulder: Secondary | ICD-10-CM | POA: Diagnosis not present

## 2019-06-27 DIAGNOSIS — M2560 Stiffness of unspecified joint, not elsewhere classified: Secondary | ICD-10-CM | POA: Diagnosis not present

## 2019-07-04 DIAGNOSIS — F4320 Adjustment disorder, unspecified: Secondary | ICD-10-CM | POA: Diagnosis not present

## 2019-07-04 DIAGNOSIS — F900 Attention-deficit hyperactivity disorder, predominantly inattentive type: Secondary | ICD-10-CM | POA: Diagnosis not present

## 2019-07-11 DIAGNOSIS — F4320 Adjustment disorder, unspecified: Secondary | ICD-10-CM | POA: Diagnosis not present

## 2019-07-11 DIAGNOSIS — F900 Attention-deficit hyperactivity disorder, predominantly inattentive type: Secondary | ICD-10-CM | POA: Diagnosis not present

## 2019-07-15 DIAGNOSIS — R262 Difficulty in walking, not elsewhere classified: Secondary | ICD-10-CM | POA: Diagnosis not present

## 2019-07-15 DIAGNOSIS — M545 Low back pain: Secondary | ICD-10-CM | POA: Diagnosis not present

## 2019-07-15 DIAGNOSIS — F900 Attention-deficit hyperactivity disorder, predominantly inattentive type: Secondary | ICD-10-CM | POA: Diagnosis not present

## 2019-07-15 DIAGNOSIS — M25512 Pain in left shoulder: Secondary | ICD-10-CM | POA: Diagnosis not present

## 2019-07-15 DIAGNOSIS — M2560 Stiffness of unspecified joint, not elsewhere classified: Secondary | ICD-10-CM | POA: Diagnosis not present

## 2019-07-19 DIAGNOSIS — R262 Difficulty in walking, not elsewhere classified: Secondary | ICD-10-CM | POA: Diagnosis not present

## 2019-07-19 DIAGNOSIS — M25512 Pain in left shoulder: Secondary | ICD-10-CM | POA: Diagnosis not present

## 2019-07-19 DIAGNOSIS — M2560 Stiffness of unspecified joint, not elsewhere classified: Secondary | ICD-10-CM | POA: Diagnosis not present

## 2019-07-19 DIAGNOSIS — M545 Low back pain: Secondary | ICD-10-CM | POA: Diagnosis not present

## 2019-07-31 DIAGNOSIS — F900 Attention-deficit hyperactivity disorder, predominantly inattentive type: Secondary | ICD-10-CM | POA: Diagnosis not present

## 2019-09-09 DIAGNOSIS — F900 Attention-deficit hyperactivity disorder, predominantly inattentive type: Secondary | ICD-10-CM | POA: Diagnosis not present

## 2019-09-09 DIAGNOSIS — F4320 Adjustment disorder, unspecified: Secondary | ICD-10-CM | POA: Diagnosis not present

## 2019-09-18 DIAGNOSIS — L237 Allergic contact dermatitis due to plants, except food: Secondary | ICD-10-CM | POA: Diagnosis not present

## 2019-09-25 DIAGNOSIS — F900 Attention-deficit hyperactivity disorder, predominantly inattentive type: Secondary | ICD-10-CM | POA: Diagnosis not present

## 2019-09-30 DIAGNOSIS — L812 Freckles: Secondary | ICD-10-CM | POA: Diagnosis not present

## 2019-09-30 DIAGNOSIS — D225 Melanocytic nevi of trunk: Secondary | ICD-10-CM | POA: Diagnosis not present

## 2019-09-30 DIAGNOSIS — D2271 Melanocytic nevi of right lower limb, including hip: Secondary | ICD-10-CM | POA: Diagnosis not present

## 2019-09-30 DIAGNOSIS — L821 Other seborrheic keratosis: Secondary | ICD-10-CM | POA: Diagnosis not present

## 2019-10-08 DIAGNOSIS — F900 Attention-deficit hyperactivity disorder, predominantly inattentive type: Secondary | ICD-10-CM | POA: Diagnosis not present

## 2019-10-08 DIAGNOSIS — F4320 Adjustment disorder, unspecified: Secondary | ICD-10-CM | POA: Diagnosis not present

## 2019-10-14 DIAGNOSIS — N138 Other obstructive and reflux uropathy: Secondary | ICD-10-CM | POA: Diagnosis not present

## 2019-10-14 DIAGNOSIS — R972 Elevated prostate specific antigen [PSA]: Secondary | ICD-10-CM | POA: Diagnosis not present

## 2019-10-14 DIAGNOSIS — N401 Enlarged prostate with lower urinary tract symptoms: Secondary | ICD-10-CM | POA: Diagnosis not present

## 2019-10-15 DIAGNOSIS — F900 Attention-deficit hyperactivity disorder, predominantly inattentive type: Secondary | ICD-10-CM | POA: Diagnosis not present

## 2019-11-05 DIAGNOSIS — F900 Attention-deficit hyperactivity disorder, predominantly inattentive type: Secondary | ICD-10-CM | POA: Diagnosis not present

## 2019-11-05 DIAGNOSIS — F4320 Adjustment disorder, unspecified: Secondary | ICD-10-CM | POA: Diagnosis not present

## 2019-11-15 ENCOUNTER — Encounter: Payer: BC Managed Care – PPO | Admitting: Family Medicine

## 2019-11-18 ENCOUNTER — Telehealth: Payer: Self-pay | Admitting: Family Medicine

## 2019-11-18 ENCOUNTER — Telehealth (INDEPENDENT_AMBULATORY_CARE_PROVIDER_SITE_OTHER): Payer: BC Managed Care – PPO | Admitting: Family Medicine

## 2019-11-18 ENCOUNTER — Other Ambulatory Visit: Payer: Self-pay

## 2019-11-18 ENCOUNTER — Encounter: Payer: Self-pay | Admitting: Family Medicine

## 2019-11-18 VITALS — HR 79 | Ht 68.0 in | Wt 169.0 lb

## 2019-11-18 DIAGNOSIS — R197 Diarrhea, unspecified: Secondary | ICD-10-CM

## 2019-11-18 MED ORDER — DIPHENOXYLATE-ATROPINE 2.5-0.025 MG PO TABS: ORAL_TABLET | ORAL | 0 refills | Status: DC

## 2019-11-18 NOTE — Telephone Encounter (Signed)
The patient called in to reschedule his physical appointment for tomorrow to October 1 because he has been having bole come out of his rear for the las 72 hours.  He said that it is not diarrhea because he's not able to eat because it just runs straight through him.  He's tried Rice, Toast and Yogurt  Imodium is not working  Chicken soup made it worse  Water goes in his mouth and comes right out his rear.  He traveled to [place] but didn't give a date  I transferred the patient to the triage nurse to see what they suggest.  Waiting for triage notes

## 2019-11-18 NOTE — Telephone Encounter (Signed)
Patient informed nurse triage he's having a lot of diarrhea. Clear and yellow coming out. No urinating since yesterday. He has had diarrhea since Thursday. Took Immodium and not helping. Eating rice and toast only. Mouth is Moist.  Patient has vritual appointment for today

## 2019-11-18 NOTE — Telephone Encounter (Signed)
Please see message. °

## 2019-11-18 NOTE — Progress Notes (Signed)
Patient ID: Damon [name], male   DOB: 1960/06/22, 59 y.o.   MRN: [id number]   This visit type was conducted due to national recommendations for restrictions regarding the COVID-19 pandemic in an effort to limit this patient's exposure and mitigate transmission in our community.   Virtual Visit via Telephone Note  I connected with Damon [name] on 11/18/19 at  4:15 PM EDT by telephone and verified that I am speaking with the correct person using two identifiers.   I discussed the limitations, risks, security and privacy concerns of performing an evaluation and management service by telephone and the availability of in person appointments. I also discussed with the patient that there may be a patient responsible charge related to this service. The patient expressed understanding and agreed to proceed.  Location patient: home Location provider: work or home office Participants present for the call: patient, provider Patient did not have a visit in the prior 7 days to address this/these issue(s).   History of Present Illness:  Locke called with onset of diarrhea late last week.  He first noted actually some diarrhea last Wednesday but worse by Friday.  No nausea or vomiting.  He has felt a little bit bloated abdominally but no real pain.  No sick contacts.  He was in [place] last week.  No travel out of country.  No recent antibiotics.  He states his diarrhea has been severe at times up to 15 stools per day.  Nonbloody.  No associated fever.  He took some Imodium which helped slightly.  He has particularly had worse diarrhea at night when lying supine.  Very disruptive to sleep.  He had colonoscopy 2016 which was normal.  He is keeping fluids down and has had poor appetite.  Has tried to take some electrolyte replacement and as well.  Past Medical History:  Diagnosis Date  . Allergy    seasonal   . Coronary artery disease   . Depression    situational   . Heart murmur    hx of in childhood   .  Hyperlipidemia   . Hypertension   . Injury of tendon of long head of right biceps    January 2016   Past Surgical History:  Procedure Laterality Date  . APPENDECTOMY    . CARDIAC CATHETERIZATION    . COLONOSCOPY N/A 11/28/2014   Procedure: COLONOSCOPY;  Surgeon: Louis Meckel, MD;  Location: [facility];  Service: Endoscopy;  Laterality: N/A;  . colonscopy      removed polyps  . CORONARY STENT INTERVENTION N/A 11/20/2017   Procedure: CORONARY STENT INTERVENTION;  Surgeon: Runell Gess, MD;  [facility];  Service: Cardiovascular;  Laterality: N/A;  . INGUINAL HERNIA REPAIR Left 06/12/2014   Procedure: LEFT INGUINAL HERNIA REPAIR WITH MESH;  Surgeon: Darnell Level, MD;  Location: [facility];  Service: General;  Laterality: Left;  . INSERTION OF MESH Left 06/12/2014   Procedure: INSERTION OF MESH;  Surgeon: Darnell Level, MD;  Location: [facility];  Service: General;  Laterality: Left;  . LEFT HEART CATH AND CORONARY ANGIOGRAPHY N/A 11/20/2017   Procedure: LEFT HEART CATH AND CORONARY ANGIOGRAPHY;  Surgeon: Runell Gess, MD;  [facility];  Service: Cardiovascular;  Laterality: N/A;  . MOUTH SURGERY     wisdom teeth removed;root canal - 03/2014  . TONSILLECTOMY  1995  . TONSILLECTOMY      reports that he quit smoking about 24 years ago. His smoking use  included cigarettes. He has a 34.50 pack-year smoking history. He has never used smokeless tobacco. He reports current alcohol use. He reports that he does not use drugs. family history includes AAA (abdominal aortic aneurysm) in his mother; Alzheimer's disease in his mother; Cancer in his paternal grandfather and another family member; Depression in an other family member; Heart disease in his paternal grandfather and another family member; Hyperlipidemia in an other family member; Hypertension in an other family member; Stroke in an other family member. Allergies  Allergen Reactions  . Poison Ivy Extract [Poison  Ivy Extract] Itching and Rash  . Poison Oak Extract [Poison Oak Extract] Itching and Rash      Observations/Objective: Patient sounds cheerful and well on the phone. I do not appreciate any SOB. Speech and thought processing are grossly intact. Patient reported vitals:  Assessment and Plan:  Diarrhea symptoms now for the past 6 days.  He does not describe any red flags such as reported weight loss, recent travels out of country, antibiotic use, fever, bloody stools  -Discussed importance of avoiding dehydration with adequate fluids and electrolyte replacement -We discussed short-term use of Lomotil 1-2 every 6 hours as needed for severe diarrhea but stop immediately if he develops any bloody stools or fever or other change of symptom -He has scheduled follow-up here in the office end of week and if not improving at that point consider further lab work and possibly stool studies  Follow Up Instructions:  -Scheduled office follow-up here end of week as above   [id number] [id number] [id number] [id number] [id number] 21-30 I did not refer this patient for an OV in the next 24 hours for this/these issue(s).  I discussed the assessment and treatment plan with the patient. The patient was provided an opportunity to ask questions and all were answered. The patient agreed with the plan and demonstrated an understanding of the instructions.   The patient was advised to call back or seek an in-person evaluation if the symptoms worsen or if the condition fails to improve as anticipated.  I provided 17 minutes of non-face-to-face time during this encounter.   Evelena Peat, MD

## 2019-11-19 ENCOUNTER — Encounter: Payer: BC Managed Care – PPO | Admitting: Family Medicine

## 2019-11-19 DIAGNOSIS — F900 Attention-deficit hyperactivity disorder, predominantly inattentive type: Secondary | ICD-10-CM | POA: Diagnosis not present

## 2019-11-19 DIAGNOSIS — F4320 Adjustment disorder, unspecified: Secondary | ICD-10-CM | POA: Diagnosis not present

## 2019-11-22 ENCOUNTER — Ambulatory Visit (INDEPENDENT_AMBULATORY_CARE_PROVIDER_SITE_OTHER): Payer: BC Managed Care – PPO | Admitting: Family Medicine

## 2019-11-22 ENCOUNTER — Encounter: Payer: Self-pay | Admitting: Family Medicine

## 2019-11-22 ENCOUNTER — Other Ambulatory Visit: Payer: Self-pay

## 2019-11-22 VITALS — BP 120/68 | HR 89 | Temp 97.6°F | Ht 67.0 in | Wt 180.6 lb

## 2019-11-22 DIAGNOSIS — Z87440 Personal history of urinary (tract) infections: Secondary | ICD-10-CM | POA: Diagnosis not present

## 2019-11-22 DIAGNOSIS — Z0000 Encounter for general adult medical examination without abnormal findings: Secondary | ICD-10-CM

## 2019-11-22 DIAGNOSIS — Z23 Encounter for immunization: Secondary | ICD-10-CM | POA: Diagnosis not present

## 2019-11-22 LAB — POCT URINALYSIS DIPSTICK
Bilirubin, UA: NEGATIVE
Blood, UA: NEGATIVE
Glucose, UA: NEGATIVE
Ketones, UA: NEGATIVE
Leukocytes, UA: NEGATIVE
Nitrite, UA: NEGATIVE
Protein, UA: NEGATIVE
Spec Grav, UA: 1.015 (ref 1.010–1.025)
Urobilinogen, UA: 0.2 E.U./dL
pH, UA: 6 (ref 5.0–8.0)

## 2019-11-22 NOTE — Patient Instructions (Signed)
Preventive Care 60-60 Years Old, Male Preventive care refers to lifestyle choices and visits with your health care provider that can promote health and wellness. This includes:  A yearly physical exam. This is also called an annual well check.  Regular dental and eye exams.  Immunizations.  Screening for certain conditions.  Healthy lifestyle choices, such as eating a healthy diet, getting regular exercise, not using drugs or products that contain nicotine and tobacco, and limiting alcohol use. What can I expect for my preventive care visit? Physical exam Your health care provider will check:  Height and weight. These may be used to calculate body mass index (BMI), which is a measurement that tells if you are at a healthy weight.  Heart rate and blood pressure.  Your skin for abnormal spots. Counseling Your health care provider may ask you questions about:  Alcohol, tobacco, and drug use.  Emotional well-being.  Home and relationship well-being.  Sexual activity.  Eating habits.  Work and work Statistician. What immunizations do I need?  Influenza (flu) vaccine  This is recommended every year. Tetanus, diphtheria, and pertussis (Tdap) vaccine  You may need a Td booster every 10 years. Varicella (chickenpox) vaccine  You may need this vaccine if you have not already been vaccinated. Zoster (shingles) vaccine  You may need this after age 60. Measles, mumps, and rubella (MMR) vaccine  You may need at least one dose of MMR if you were born in 1957 or later. You may also need a second dose. Pneumococcal conjugate (PCV13) vaccine  You may need this if you have certain conditions and were not previously vaccinated. Pneumococcal polysaccharide (PPSV23) vaccine  You may need one or two doses if you smoke cigarettes or if you have certain conditions. Meningococcal conjugate (MenACWY) vaccine  You may need this if you have certain conditions. Hepatitis A  vaccine  You may need this if you have certain conditions or if you travel or work in places where you may be exposed to hepatitis A. Hepatitis B vaccine  You may need this if you have certain conditions or if you travel or work in places where you may be exposed to hepatitis B. Haemophilus influenzae type b (Hib) vaccine  You may need this if you have certain risk factors. Human papillomavirus (HPV) vaccine  If recommended by your health care provider, you may need three doses over 6 months. You may receive vaccines as individual doses or as more than one vaccine together in one shot (combination vaccines). Talk with your health care provider about the risks and benefits of combination vaccines. What tests do I need? Blood tests  Lipid and cholesterol levels. These may be checked every 5 years, or more frequently if you are over 60 years old.  Hepatitis C test.  Hepatitis B test. Screening  Lung cancer screening. You may have this screening every year starting at age 60 if you have a 30-pack-year history of smoking and currently smoke or have quit within the past 15 years.  Prostate cancer screening. Recommendations will vary depending on your family history and other risks.  Colorectal cancer screening. All adults should have this screening starting at age 60 and continuing until age 60. Your health care provider may recommend screening at age 60 if you are at increased risk. You will have tests every 1-10 years, depending on your results and the type of screening test.  Diabetes screening. This is done by checking your blood sugar (glucose) after you have not eaten  for a while (fasting). You may have this done every 1-3 years.  Sexually transmitted disease (STD) testing. Follow these instructions at home: Eating and drinking  Eat a diet that includes fresh fruits and vegetables, whole grains, lean protein, and low-fat dairy products.  Take vitamin and mineral supplements as  recommended by your health care provider.  Do not drink alcohol if your health care provider tells you not to drink.  If you drink alcohol: ? Limit how much you have to 0-2 drinks a day. ? Be aware of how much alcohol is in your drink. In the [place] one drink equals one 12 oz bottle of beer (355 mL), one 5 oz glass of wine (148 mL), or one 1 oz glass of hard liquor (44 mL). Lifestyle  Take daily care of your teeth and gums.  Stay active. Exercise for at least 30 minutes on 5 or more days each week.  Do not use any products that contain nicotine or tobacco, such as cigarettes, e-cigarettes, and chewing tobacco. If you need help quitting, ask your health care provider.  If you are sexually active, practice safe sex. Use a condom or other form of protection to prevent STIs (sexually transmitted infections).  Talk with your health care provider about taking a low-dose aspirin every day starting at age 60. What's next?  Go to your health care provider once a year for a well check visit.  Ask your health care provider how often you should have your eyes and teeth checked.  Stay up to date on all vaccines. This information is not intended to replace advice given to you by your health care provider. Make sure you discuss any questions you have with your health care provider. Document Revised: 02/01/2018 Document Reviewed: 02/01/2018 Elsevier Patient Education  2020 [employer].

## 2019-11-22 NOTE — Progress Notes (Signed)
Established Patient Office Visit  Subjective:  Patient ID: Damon [name], male    DOB: 13-Oct-1960  Age: 59 y.o. MRN: [id number]  CC:  Chief Complaint  Patient presents with  . Annual Exam    having fluid retention    HPI Damon [name] presents for physical exam.  He has history of CAD, hypertension, Raynaud's disease, dyslipidemia.  Also has history of elevated PSA and recently evaluated by urology.  He had called earlier in the week with some diarrhea.  After taking just couple doses of Lomotil his diarrhea has essentially resolved.  He does need flu vaccine and also needs tetanus booster.  Health maintenance reviewed  -Covid vaccines given including booster -Needs flu vaccine as above -Previous hepatitis C screen negative -Colonoscopy due 10/26 -Previous Shingrix given  -FM and PMH reviewed with no changes.  SH- Quit smoking 1997,  No regular ETOH  Past Medical History:  Diagnosis Date  . Allergy    seasonal   . Coronary artery disease   . Depression    situational   . Heart murmur    hx of in childhood   . Hyperlipidemia   . Hypertension   . Injury of tendon of long head of right biceps    January 2016    Past Surgical History:  Procedure Laterality Date  . APPENDECTOMY    . CARDIAC CATHETERIZATION    . COLONOSCOPY N/A 11/28/2014   Procedure: COLONOSCOPY;  Surgeon: Louis Meckel, MD;  Location: [facility];  Service: Endoscopy;  Laterality: N/A;  . colonscopy      removed polyps  . CORONARY STENT INTERVENTION N/A 11/20/2017   Procedure: CORONARY STENT INTERVENTION;  Surgeon: Runell Gess, MD;  [facility];  Service: Cardiovascular;  Laterality: N/A;  . INGUINAL HERNIA REPAIR Left 06/12/2014   Procedure: LEFT INGUINAL HERNIA REPAIR WITH MESH;  Surgeon: Darnell Level, MD;  Location: [facility];  Service: General;  Laterality: Left;  . INSERTION OF MESH Left 06/12/2014   Procedure: INSERTION OF MESH;  Surgeon: Darnell Level, MD;  Location: [facility];   Service: General;  Laterality: Left;  . LEFT HEART CATH AND CORONARY ANGIOGRAPHY N/A 11/20/2017   Procedure: LEFT HEART CATH AND CORONARY ANGIOGRAPHY;  Surgeon: Runell Gess, MD;  [facility];  Service: Cardiovascular;  Laterality: N/A;  . MOUTH SURGERY     wisdom teeth removed;root canal - 03/2014  . TONSILLECTOMY  1995  . TONSILLECTOMY      Family History  Problem Relation Age of Onset  . Depression Other   . Hyperlipidemia Other   . Hypertension Other   . Cancer Other        prostate  . Stroke Other   . Heart disease Other   . Cancer Paternal Grandfather   . Heart disease Paternal Grandfather   . Alzheimer's disease Mother   . AAA (abdominal aortic aneurysm) Mother     Social History   Socioeconomic History  . Marital status: Married    Spouse name: Not on file  . Number of children: Not on file  . Years of education: Not on file  . Highest education level: Professional school degree (e.g., MD, DDS, DVM, JD)  Occupational History  . Not on file  Tobacco Use  . Smoking status: Former Smoker    Packs/day: 1.50    Years: 23.00    Pack years: 34.50    Types: Cigarettes    Quit date: 02/22/1995  Years since quitting: 24.7  . Smokeless tobacco: Never Used  Vaping Use  . Vaping Use: Never used  Substance and Sexual Activity  . Alcohol use: Yes    Comment: occas  . Drug use: No  . Sexual activity: Not on file  Other Topics Concern  . Not on file  Social History Narrative  . Not on file   Social Determinants of Health   Financial Resource Strain:   . Difficulty of Paying Living Expenses: Not on file  Food Insecurity:   . Worried About Programme researcher, broadcasting/film/videounning Out of Food in the Last Year: Not on file  . Ran Out of Food in the Last Year: Not on file  Transportation Needs:   . Lack of Transportation (Medical): Not on file  . Lack of Transportation (Non-Medical): Not on file  Physical Activity:   . Days of Exercise per Week: Not on file  . Minutes of  Exercise per Session: Not on file  Stress:   . Feeling of Stress : Not on file  Social Connections:   . Frequency of Communication with Friends and Family: Not on file  . Frequency of Social Gatherings with Friends and Family: Not on file  . Attends Religious Services: Not on file  . Active Member of Clubs or Organizations: Not on file  . Attends BankerClub or Organization Meetings: Not on file  . Marital Status: Not on file  Intimate Partner Violence:   . Fear of Current or Ex-Partner: Not on file  . Emotionally Abused: Not on file  . Physically Abused: Not on file  . Sexually Abused: Not on file    Outpatient Medications Prior to Visit  Medication Sig Dispense Refill  . aspirin EC 81 MG EC tablet Take 1 tablet (81 mg total) by mouth daily.    Marland Kitchen. atorvastatin (LIPITOR) 80 MG tablet Take 1 tablet (80 mg total) by mouth daily at 6 PM. 90 tablet 3  . cetirizine (ZYRTEC) 10 MG tablet Take 10 mg by mouth 2 (two) times daily as needed.     . clonazePAM (KLONOPIN) 0.5 MG tablet Take 0.5-1 mg by mouth See admin instructions. Take 2 tablets in the morning and 1 tablet in the evening. May take 1 additional tablet as needed for anxiety    . diphenoxylate-atropine (LOMOTIL) 2.5-0.025 MG tablet Take one to two tablets by mouth every 6 hours as needed for severe diarrhea. 30 tablet 0  . lisinopril (ZESTRIL) 20 MG tablet Take 1 tablet (20 mg total) by mouth daily. 90 tablet 3  . nitroGLYCERIN (NITROSTAT) 0.4 MG SL tablet Place 1 tablet (0.4 mg total) under the tongue every 5 (five) minutes x 3 doses as needed for chest pain. 25 tablet 1  . tamsulosin (FLOMAX) 0.4 MG CAPS capsule Take 0.4 mg by mouth daily.    Marland Kitchen. VASCEPA 1 g capsule Take 2 capsules (2 g total) by mouth 2 (two) times daily. 120 capsule 11  . lisdexamfetamine (VYVANSE) 30 MG capsule Take 30 mg by mouth as needed.     . methocarbamol (ROBAXIN) 500 MG tablet Take 1 tablet (500 mg total) by mouth at bedtime as needed. 30 tablet 0  . ezetimibe  (ZETIA) 10 MG tablet Take 1 tablet (10 mg total) by mouth daily. 90 tablet 3   No facility-administered medications prior to visit.    Allergies  Allergen Reactions  . Poison Ivy Extract [Poison Ivy Extract] Itching and Rash  . Poison Oak Extract [Poison Oak Extract] Itching and Rash  ROS Review of Systems  Constitutional: Negative for activity change, appetite change, fatigue and fever.  HENT: Negative for congestion, ear pain and trouble swallowing.   Eyes: Negative for pain and visual disturbance.  Respiratory: Negative for cough, shortness of breath and wheezing.   Cardiovascular: Negative for chest pain and palpitations.  Gastrointestinal: Negative for abdominal distention, abdominal pain, blood in stool, constipation, diarrhea, nausea, rectal pain and vomiting.  Genitourinary: Negative for dysuria, hematuria and testicular pain.  Musculoskeletal: Negative for arthralgias and joint swelling.  Skin: Negative for rash.  Neurological: Negative for dizziness, syncope and headaches.  Hematological: Negative for adenopathy.  Psychiatric/Behavioral: Negative for confusion and dysphoric mood.      Objective:    Physical Exam Constitutional:      General: He is not in acute distress.    Appearance: He is well-developed.  HENT:     Head: Normocephalic and atraumatic.     Right Ear: External ear normal.     Left Ear: External ear normal.  Eyes:     Conjunctiva/sclera: Conjunctivae normal.     Pupils: Pupils are equal, round, and reactive to light.  Neck:     Thyroid: No thyromegaly.  Cardiovascular:     Rate and Rhythm: Normal rate and regular rhythm.     Heart sounds: Normal heart sounds. No murmur heard.   Pulmonary:     Effort: No respiratory distress.     Breath sounds: No wheezing or rales.  Abdominal:     General: Bowel sounds are normal. There is no distension.     Palpations: Abdomen is soft. There is no mass.     Tenderness: There is no abdominal tenderness.  There is no guarding or rebound.  Musculoskeletal:     Cervical back: Normal range of motion and neck supple.  Lymphadenopathy:     Cervical: No cervical adenopathy.  Skin:    Findings: No rash.  Neurological:     Mental Status: He is alert and oriented to person, place, and time.     Cranial Nerves: No cranial nerve deficit.     Deep Tendon Reflexes: Reflexes normal.     BP 120/68   Pulse 89   Temp 97.6 F (36.4 C) (Oral)   Ht 5\' 7"  (1.702 m)   Wt 180 lb 9.6 oz (81.9 kg)   SpO2 95%   BMI 28.29 kg/m  Wt Readings from Last 3 Encounters:  11/22/19 180 lb 9.6 oz (81.9 kg)  11/18/19 169 lb (76.7 kg)  04/16/19 177 lb (80.3 kg)     Health Maintenance Due  Topic Date Due  . INFLUENZA VACCINE  09/22/2019  . TETANUS/TDAP  11/10/2019    There are no preventive care reminders to display for this patient.  Lab Results  Component Value Date   TSH 1.48 11/14/2018   Lab Results  Component Value Date   WBC 9.9 11/14/2018   HGB 15.0 11/14/2018   HCT 45.7 11/14/2018   MCV 89.7 11/14/2018   PLT 286.0 11/14/2018   Lab Results  Component Value Date   NA 135 11/14/2018   K 5.2 No hemolysis seen (H) 11/14/2018   CO2 28 11/14/2018   GLUCOSE 90 11/14/2018   BUN 19 11/14/2018   CREATININE 0.95 11/14/2018   BILITOT 0.6 11/14/2018   ALKPHOS 91 11/14/2018   AST 28 11/14/2018   ALT 37 11/14/2018   PROT 8.1 11/14/2018   ALBUMIN 5.1 11/14/2018   CALCIUM 10.5 11/14/2018   ANIONGAP 5 11/21/2017  GFR 81.08 11/14/2018   Lab Results  Component Value Date   CHOL 131 01/28/2019   Lab Results  Component Value Date   HDL 40 01/28/2019   Lab Results  Component Value Date   LDLCALC 70 01/28/2019   Lab Results  Component Value Date   TRIG 115 01/28/2019   Lab Results  Component Value Date   CHOLHDL 3 11/14/2018   No results found for: HGBA1C    Assessment & Plan:   Problem List Items Addressed This Visit    None    Visit Diagnoses    Physical exam    -  Primary     Relevant Orders   Basic metabolic panel   Lipid panel   CBC with Differential/Platelet   TSH   Hepatic function panel   PSA   POCT urinalysis dipstick    -Flu vaccine given. -Tetanus booster given -Covid vaccines as above given -Colonoscopy up-to-date -Obtain follow-up labs.  Include PSA.  Pt has occasional mild dysuria and will add urine dip.  -shingrix already given.    No orders of the defined types were placed in this encounter.   Follow-up: No follow-ups on file.    Evelena Peat, MD

## 2019-11-23 LAB — HEPATIC FUNCTION PANEL
AG Ratio: 1.6 (calc) (ref 1.0–2.5)
ALT: 19 U/L (ref 9–46)
AST: 16 U/L (ref 10–35)
Albumin: 4.1 g/dL (ref 3.6–5.1)
Alkaline phosphatase (APISO): 85 U/L (ref 35–144)
Bilirubin, Direct: 0.1 mg/dL (ref 0.0–0.2)
Globulin: 2.5 g/dL (calc) (ref 1.9–3.7)
Indirect Bilirubin: 0.1 mg/dL (calc) — ABNORMAL LOW (ref 0.2–1.2)
Total Bilirubin: 0.2 mg/dL (ref 0.2–1.2)
Total Protein: 6.6 g/dL (ref 6.1–8.1)

## 2019-11-23 LAB — BASIC METABOLIC PANEL
BUN: 21 mg/dL (ref 7–25)
CO2: 28 mmol/L (ref 20–32)
Calcium: 9.3 mg/dL (ref 8.6–10.3)
Chloride: 100 mmol/L (ref 98–110)
Creat: 0.73 mg/dL (ref 0.70–1.25)
Glucose, Bld: 98 mg/dL (ref 65–99)
Potassium: 4.5 mmol/L (ref 3.5–5.3)
Sodium: 136 mmol/L (ref 135–146)

## 2019-11-23 LAB — CBC WITH DIFFERENTIAL/PLATELET
Absolute Monocytes: 1009 cells/uL — ABNORMAL HIGH (ref 200–950)
Basophils Absolute: 62 cells/uL (ref 0–200)
Basophils Relative: 0.5 %
Eosinophils Absolute: 234 cells/uL (ref 15–500)
Eosinophils Relative: 1.9 %
HCT: 40.1 % (ref 38.5–50.0)
Hemoglobin: 13.2 g/dL (ref 13.2–17.1)
Lymphs Abs: 2583 cells/uL (ref 850–3900)
MCH: 28.3 pg (ref 27.0–33.0)
MCHC: 32.9 g/dL (ref 32.0–36.0)
MCV: 86.1 fL (ref 80.0–100.0)
MPV: 11.5 fL (ref 7.5–12.5)
Monocytes Relative: 8.2 %
Neutro Abs: 8413 cells/uL — ABNORMAL HIGH (ref 1500–7800)
Neutrophils Relative %: 68.4 %
Platelets: 258 10*3/uL (ref 140–400)
RBC: 4.66 10*6/uL (ref 4.20–5.80)
RDW: 12.8 % (ref 11.0–15.0)
Total Lymphocyte: 21 %
WBC: 12.3 10*3/uL — ABNORMAL HIGH (ref 3.8–10.8)

## 2019-11-23 LAB — LIPID PANEL
Cholesterol: 89 mg/dL (ref <200)
HDL: 36 mg/dL — ABNORMAL LOW (ref >=40)
LDL Cholesterol (Calc): 35 mg/dL (calc)
Non-HDL Cholesterol (Calc): 53 mg/dL (calc) (ref <130)
Total CHOL/HDL Ratio: 2.5 (calc) (ref <5.0)
Triglycerides: 101 mg/dL (ref <150)

## 2019-11-23 LAB — TSH: TSH: 1.02 mIU/L (ref 0.40–4.50)

## 2019-11-23 LAB — PSA: PSA: 10.42 ng/mL — ABNORMAL HIGH (ref <=4.0)

## 2019-11-25 DIAGNOSIS — F4320 Adjustment disorder, unspecified: Secondary | ICD-10-CM | POA: Diagnosis not present

## 2019-11-25 DIAGNOSIS — F900 Attention-deficit hyperactivity disorder, predominantly inattentive type: Secondary | ICD-10-CM | POA: Diagnosis not present

## 2019-12-24 DIAGNOSIS — L723 Sebaceous cyst: Secondary | ICD-10-CM | POA: Diagnosis not present

## 2019-12-24 DIAGNOSIS — F4320 Adjustment disorder, unspecified: Secondary | ICD-10-CM | POA: Diagnosis not present

## 2019-12-24 DIAGNOSIS — F900 Attention-deficit hyperactivity disorder, predominantly inattentive type: Secondary | ICD-10-CM | POA: Diagnosis not present

## 2019-12-24 DIAGNOSIS — L821 Other seborrheic keratosis: Secondary | ICD-10-CM | POA: Diagnosis not present

## 2019-12-24 DIAGNOSIS — L738 Other specified follicular disorders: Secondary | ICD-10-CM | POA: Diagnosis not present

## 2020-01-01 DIAGNOSIS — F411 Generalized anxiety disorder: Secondary | ICD-10-CM | POA: Diagnosis not present

## 2020-01-07 DIAGNOSIS — F900 Attention-deficit hyperactivity disorder, predominantly inattentive type: Secondary | ICD-10-CM | POA: Diagnosis not present

## 2020-01-07 DIAGNOSIS — F4320 Adjustment disorder, unspecified: Secondary | ICD-10-CM | POA: Diagnosis not present

## 2020-01-20 DIAGNOSIS — F900 Attention-deficit hyperactivity disorder, predominantly inattentive type: Secondary | ICD-10-CM | POA: Diagnosis not present

## 2020-01-22 DIAGNOSIS — F4320 Adjustment disorder, unspecified: Secondary | ICD-10-CM | POA: Diagnosis not present

## 2020-01-22 DIAGNOSIS — F900 Attention-deficit hyperactivity disorder, predominantly inattentive type: Secondary | ICD-10-CM | POA: Diagnosis not present

## 2020-02-05 DIAGNOSIS — K130 Diseases of lips: Secondary | ICD-10-CM | POA: Diagnosis not present

## 2020-02-05 DIAGNOSIS — L723 Sebaceous cyst: Secondary | ICD-10-CM | POA: Diagnosis not present

## 2020-02-11 DIAGNOSIS — F900 Attention-deficit hyperactivity disorder, predominantly inattentive type: Secondary | ICD-10-CM | POA: Diagnosis not present

## 2020-02-11 DIAGNOSIS — F4320 Adjustment disorder, unspecified: Secondary | ICD-10-CM | POA: Diagnosis not present

## 2020-02-18 ENCOUNTER — Other Ambulatory Visit: Payer: Self-pay | Admitting: Cardiovascular Disease

## 2020-02-19 ENCOUNTER — Telehealth: Payer: Self-pay | Admitting: Cardiovascular Disease

## 2020-02-19 DIAGNOSIS — M545 Low back pain, unspecified: Secondary | ICD-10-CM | POA: Diagnosis not present

## 2020-02-19 DIAGNOSIS — M2569 Stiffness of other specified joint, not elsewhere classified: Secondary | ICD-10-CM | POA: Diagnosis not present

## 2020-02-19 DIAGNOSIS — M6281 Muscle weakness (generalized): Secondary | ICD-10-CM | POA: Diagnosis not present

## 2020-02-19 MED ORDER — NITROGLYCERIN 0.4 MG SL SUBL: 0.4000 mg | SUBLINGUAL_TABLET | SUBLINGUAL | 1 refills | Status: DC | PRN

## 2020-02-19 MED ORDER — EZETIMIBE 10 MG PO TABS: 10.0000 mg | ORAL_TABLET | Freq: Every day | ORAL | 3 refills | Status: DC

## 2020-02-19 MED ORDER — ATORVASTATIN CALCIUM 80 MG PO TABS: ORAL_TABLET | ORAL | 3 refills | Status: DC

## 2020-02-19 MED ORDER — LISINOPRIL 20 MG PO TABS: 20.0000 mg | ORAL_TABLET | Freq: Every day | ORAL | 3 refills | Status: DC

## 2020-02-19 NOTE — Telephone Encounter (Signed)
   *  STAT* If patient is at the pharmacy, call can be transferred to refill team.   1. Which medications need to be refilled? (please list name of each medication and dose if known)   atorvastatin (LIPITOR) 80 MG tablet  ezetimibe (ZETIA) 10 MG tablet  lisinopril (ZESTRIL) 20 MG tablet    nitroGLYCERIN (NITROSTAT) 0.4 MG SL tablet   2. Which pharmacy/location (including street and city if local pharmacy) is medication to be sent to? [facility] - [place], [place] - [address]  3. Do they need a 30 day or 90 day supply? 90 days, Nitroglycerin 1 bottle    Pt said he needs 90 days supply because its easier to get 90 days at a time and cheaper for him. He verified his appt on 01/12 and he said he will be at the appt. He really wants to get 90 days. He said he needs to pick it up to or tomorrow

## 2020-02-19 NOTE — Telephone Encounter (Signed)
Got all the labs we need. Will see and discuss at appt.

## 2020-02-19 NOTE — Telephone Encounter (Signed)
    Pt is calling, he had several concerns, he would like to let Dr. Salena Saner know to schedule him ahead of time for his yearly f/u, after explaining recall process he still wants to send a message to Dr. Salena Saner about it. Secondly, pt wanted to ask if he needs blood work before seeing Dr.C and also a PSA test. Lastly, he wanted to let Dr. Salena Saner he will have a procedure tomorrow with Dr. Darcella Gasman for  Lip cyst procedure. He said Dr. Hyacinth Meeker located at [address] [place].

## 2020-02-19 NOTE — Telephone Encounter (Signed)
Refills sent to pt's pharmacy.  Relayed message from Dr. Salena Saner and answered all questions for pt. Pt confirmed that we are able to see [facility] healthcare and [facility] system in care everywhere so that Dr. Salena Saner is aware of all healthcare related things. Insured pt that link was successful and we as a system are able to see the actions of other healthcare systems.  Pt verbalizes understand and is appreciative.

## 2020-02-20 DIAGNOSIS — M2569 Stiffness of other specified joint, not elsewhere classified: Secondary | ICD-10-CM | POA: Diagnosis not present

## 2020-02-20 DIAGNOSIS — M6281 Muscle weakness (generalized): Secondary | ICD-10-CM | POA: Diagnosis not present

## 2020-02-20 DIAGNOSIS — K130 Diseases of lips: Secondary | ICD-10-CM | POA: Diagnosis not present

## 2020-02-20 DIAGNOSIS — M545 Low back pain, unspecified: Secondary | ICD-10-CM | POA: Diagnosis not present

## 2020-02-24 DIAGNOSIS — M6281 Muscle weakness (generalized): Secondary | ICD-10-CM | POA: Diagnosis not present

## 2020-02-24 DIAGNOSIS — M2569 Stiffness of other specified joint, not elsewhere classified: Secondary | ICD-10-CM | POA: Diagnosis not present

## 2020-02-24 DIAGNOSIS — M545 Low back pain, unspecified: Secondary | ICD-10-CM | POA: Diagnosis not present

## 2020-02-26 DIAGNOSIS — M6281 Muscle weakness (generalized): Secondary | ICD-10-CM | POA: Diagnosis not present

## 2020-02-26 DIAGNOSIS — M545 Low back pain, unspecified: Secondary | ICD-10-CM | POA: Diagnosis not present

## 2020-02-26 DIAGNOSIS — M2569 Stiffness of other specified joint, not elsewhere classified: Secondary | ICD-10-CM | POA: Diagnosis not present

## 2020-02-27 DIAGNOSIS — F411 Generalized anxiety disorder: Secondary | ICD-10-CM | POA: Diagnosis not present

## 2020-02-28 DIAGNOSIS — M2569 Stiffness of other specified joint, not elsewhere classified: Secondary | ICD-10-CM | POA: Diagnosis not present

## 2020-02-28 DIAGNOSIS — M545 Low back pain, unspecified: Secondary | ICD-10-CM | POA: Diagnosis not present

## 2020-02-28 DIAGNOSIS — M6281 Muscle weakness (generalized): Secondary | ICD-10-CM | POA: Diagnosis not present

## 2020-03-03 DIAGNOSIS — M2569 Stiffness of other specified joint, not elsewhere classified: Secondary | ICD-10-CM | POA: Diagnosis not present

## 2020-03-03 DIAGNOSIS — M545 Low back pain, unspecified: Secondary | ICD-10-CM | POA: Diagnosis not present

## 2020-03-03 DIAGNOSIS — M6281 Muscle weakness (generalized): Secondary | ICD-10-CM | POA: Diagnosis not present

## 2020-03-04 ENCOUNTER — Encounter: Payer: Self-pay | Admitting: Cardiovascular Disease

## 2020-03-04 ENCOUNTER — Other Ambulatory Visit: Payer: Self-pay

## 2020-03-04 ENCOUNTER — Ambulatory Visit: Payer: BC Managed Care – PPO | Admitting: Cardiovascular Disease

## 2020-03-04 VITALS — BP 120/82 | HR 74 | Ht 67.0 in | Wt 181.0 lb

## 2020-03-04 DIAGNOSIS — I2510 Atherosclerotic heart disease of native coronary artery without angina pectoris: Secondary | ICD-10-CM

## 2020-03-04 DIAGNOSIS — E785 Hyperlipidemia, unspecified: Secondary | ICD-10-CM | POA: Diagnosis not present

## 2020-03-04 DIAGNOSIS — I519 Heart disease, unspecified: Secondary | ICD-10-CM

## 2020-03-04 DIAGNOSIS — I10 Essential (primary) hypertension: Secondary | ICD-10-CM

## 2020-03-04 DIAGNOSIS — I7300 Raynaud's syndrome without gangrene: Secondary | ICD-10-CM | POA: Diagnosis not present

## 2020-03-04 MED ORDER — LISINOPRIL 10 MG PO TABS: 10.0000 mg | ORAL_TABLET | Freq: Every day | ORAL | 3 refills | Status: DC

## 2020-03-04 MED ORDER — AMLODIPINE BESYLATE 5 MG PO TABS: 5.0000 mg | ORAL_TABLET | Freq: Every day | ORAL | 3 refills | Status: DC

## 2020-03-04 NOTE — Progress Notes (Signed)
Cardiology Office Note:    Date:  03/09/2020   ID:  Damon [name], DOB 10/06/1960, MRN [id number]  PCP:  Kristian Covey, MD  Cardiologist:  Thurmon Fair, MD  Electrophysiologist:  None   Referring MD: Kristian Covey, MD   Chief Complaint  Patient presents with  . Follow-up    12 months.    History of Present Illness:    Damon [name] is a 60 y.o. male with a hx of acute non-STEMI leading to placement of a drug-eluting stent in the ramus intermedius artery November 20, 2017, hyperlipidemia, hypertension, Raynaud's syndrome.  His wife accompanies him today.  He continues doing quite well from a cardiovascular point of view.  He enjoys riding his Peloton bike and even when pushing hard denies dyspnea or angina.  He denies palpitations, syncope, dizziness, leg edema, orthopnea, PND or any focal neurological complaints.  Although his Raynaud's syndrome improved when we switch from metoprolol to carvedilol, it still bothered him and we have stopped his beta-blocker altogether.  He still has Raynaud's, which is even evident today after coming in from the cold outside.  He has had a variety of noncardiac issues including resection of a pilar cyst from his lip.  Has some low back pain related to an L2 spondylolisthesis.  He is taking Vyvanse, although this is not included on his medication list today.  Unable to take Vascepa due to insurance coverage/cost issues.  His most recent lipid profile from last October 2021 shows an excellent LDL cholesterol 35 but also a persistently low HDL at 36.  Echo at presentation in September 2019 showed LVEF 45-50% with lateral wall motion abnormalities.  Follow-up echo on December 5 shows resolution of the regional abnormalities with an EF of 50-55%.   Past Medical History:  Diagnosis Date  . Allergy    seasonal   . Coronary artery disease   . Depression    situational   . Heart murmur    hx of in childhood   . Hyperlipidemia   . Hypertension    . Injury of tendon of long head of right biceps    January 2016    Past Surgical History:  Procedure Laterality Date  . APPENDECTOMY    . CARDIAC CATHETERIZATION    . COLONOSCOPY N/A 11/28/2014   Procedure: COLONOSCOPY;  Surgeon: Louis Meckel, MD;  Location: [facility];  Service: Endoscopy;  Laterality: N/A;  . colonscopy      removed polyps  . CORONARY STENT INTERVENTION N/A 11/20/2017   Procedure: CORONARY STENT INTERVENTION;  Surgeon: Runell Gess, MD;  Location: [facility];  Service: Cardiovascular;  Laterality: N/A;  . INGUINAL HERNIA REPAIR Left 06/12/2014   Procedure: LEFT INGUINAL HERNIA REPAIR WITH MESH;  Surgeon: Darnell Level, MD;  Location: [facility];  Service: General;  Laterality: Left;  . INSERTION OF MESH Left 06/12/2014   Procedure: INSERTION OF MESH;  Surgeon: Darnell Level, MD;  Location: [facility];  Service: General;  Laterality: Left;  . LEFT HEART CATH AND CORONARY ANGIOGRAPHY N/A 11/20/2017   Procedure: LEFT HEART CATH AND CORONARY ANGIOGRAPHY;  Surgeon: Runell Gess, MD;  Location: [facility];  Service: Cardiovascular;  Laterality: N/A;  . MOUTH SURGERY     wisdom teeth removed;root canal - 03/2014  . TONSILLECTOMY  1995  . TONSILLECTOMY      Current Medications: Current Meds  Medication Sig  . amLODipine (NORVASC) 5 MG tablet Take 1 tablet (5 mg  total) by mouth daily.  Marland Kitchen aspirin EC 81 MG EC tablet Take 1 tablet (81 mg total) by mouth daily.  Marland Kitchen atorvastatin (LIPITOR) 80 MG tablet TAKE 1 TABLET ONCE DAILY AT 6PM FOR CHOLESTEROL  . cetirizine (ZYRTEC) 10 MG tablet Take 10 mg by mouth 2 (two) times daily as needed.   . clonazePAM (KLONOPIN) 0.5 MG tablet Take 0.5-1 mg by mouth See admin instructions. Take 2 tablets in the morning and 1 tablet in the evening. May take 1 additional tablet as needed for anxiety  . diphenoxylate-atropine (LOMOTIL) 2.5-0.025 MG tablet Take one to two tablets by mouth every 6 hours as needed for severe diarrhea.  .  ezetimibe (ZETIA) 10 MG tablet Take 1 tablet (10 mg total) by mouth daily.  . nitroGLYCERIN (NITROSTAT) 0.4 MG SL tablet Place 1 tablet (0.4 mg total) under the tongue every 5 (five) minutes x 3 doses as needed for chest pain.  . tamsulosin (FLOMAX) 0.4 MG CAPS capsule Take 0.4 mg by mouth daily.  Marland Kitchen VASCEPA 1 g capsule Take 2 capsules (2 g total) by mouth 2 (two) times daily.  . [DISCONTINUED] lisinopril (ZESTRIL) 20 MG tablet Take 1 tablet (20 mg total) by mouth daily.     Allergies:   Poison ivy extract [poison ivy extract] and Poison oak extract [poison oak extract]   Social History   Socioeconomic History  . Marital status: Married    Spouse name: Not on file  . Number of children: Not on file  . Years of education: Not on file  . Highest education level: Professional school degree (e.g., MD, DDS, DVM, JD)  Occupational History  . Not on file  Tobacco Use  . Smoking status: Former Smoker    Packs/day: 1.50    Years: 23.00    Pack years: 34.50    Types: Cigarettes    Quit date: 02/22/1995    Years since quitting: 25.0  . Smokeless tobacco: Never Used  Vaping Use  . Vaping Use: Never used  Substance and Sexual Activity  . Alcohol use: Yes    Comment: occas  . Drug use: No  . Sexual activity: Not on file  Other Topics Concern  . Not on file  Social History Narrative  . Not on file   Social Determinants of Health   Financial Resource Strain: Not on file  Food Insecurity: Not on file  Transportation Needs: Not on file  Physical Activity: Not on file  Stress: Not on file  Social Connections: Not on file     Family History: The patient's family history includes AAA (abdominal aortic aneurysm) in his mother; Alzheimer's disease in his mother; Cancer in his paternal grandfather and another family member; Depression in an other family member; Heart disease in his paternal grandfather and another family member; Hyperlipidemia in an other family member; Hypertension in an  other family member; Stroke in an other family member.  ROS:   Please see the history of present illness.   All other systems are reviewed and are negative.   EKGs/Labs/Other Studies Reviewed:    The following studies were reviewed today: Coronary angiograms, echo results from January 25, 2018  EKG:  EKG is ordered today.  Shows normal sinus rhythm, incomplete right bundle branch block, borderline QTC 461 ms, no ischemic repolarization abnormalities Recent Labs: 11/22/2019: ALT 19; BUN 21; Creat 0.73; Hemoglobin 13.2; Platelets 258; Potassium 4.5; Sodium 136; TSH 1.02  Recent Lipid Panel    Component Value Date/Time   CHOL  89 11/22/2019 1117   CHOL 131 01/28/2019 1624   TRIG 101 11/22/2019 1117   HDL 36 (L) 11/22/2019 1117   HDL 40 01/28/2019 1624   CHOLHDL 2.5 11/22/2019 1117   VLDL 34.4 11/14/2018 1013   LDLCALC 35 11/22/2019 1117   LDLDIRECT 93.0 12/09/2015 0820   11/14/2018 Total cholesterol 137, HDL 45, LDL 58, triglycerides 172 Hemoglobin 15, creatinine 0.95, normal liver function tests and TSH  Physical Exam:    VS:  BP 120/82 (BP Location: Left Arm, Patient Position: Sitting, Cuff Size: Normal)   Pulse 74   Ht 5\' 7"  (1.702 m)   Wt 181 lb (82.1 kg)   BMI 28.35 kg/m     Wt Readings from Last 3 Encounters:  03/04/20 181 lb (82.1 kg)  11/22/19 180 lb 9.6 oz (81.9 kg)  11/18/19 169 lb (76.7 kg)      General: Alert, oriented x3, no distress, overweight Head: no evidence of trauma, PERRL, EOMI, no exophtalmos or lid lag, no myxedema, no xanthelasma; normal ears, nose and oropharynx Neck: normal jugular venous pulsations and no hepatojugular reflux; brisk carotid pulses without delay and no carotid bruits Chest: clear to auscultation, no signs of consolidation by percussion or palpation, normal fremitus, symmetrical and full respiratory excursions Cardiovascular: normal position and quality of the apical impulse, regular rhythm, normal first and second heart sounds,  no murmurs, rubs or gallops Abdomen: no tenderness or distention, no masses by palpation, no abnormal pulsatility or arterial bruits, normal bowel sounds, no hepatosplenomegaly Extremities: Transient pallor and slight cyanosis of his fingertips when he first came in, resolving during the visit.  No clubbing, cyanosis or edema; 2+ radial, ulnar and brachial pulses bilaterally; 2+ right femoral, posterior tibial and dorsalis pedis pulses; 2+ left femoral, posterior tibial and dorsalis pedis pulses; no subclavian or femoral bruits Neurological: grossly nonfocal Psych: Normal mood and affect   ASSESSMENT:    1. Coronary artery disease involving native coronary artery of native heart without angina pectoris   2. Left ventricular systolic dysfunction without heart failure   3. Dyslipidemia   4. Raynaud's disease without gangrene   5. Essential hypertension    PLAN:    In order of problems listed above:  1. CAD: Asymptomatic even with a more intense physical activity.Marland Kitchen.  He presented with non-STEMI in September 2019 and received a drug-eluting stent.  On aspirin and statin.  Intolerant of beta-blockers due to Raynaud's syndrome. 2. LV dysfunction: Transient LV dysfunction due to stunning has almost completely returned to baseline.  On ACE inhibitor. 3. HLP: LDL at target, HDL will not improve without substantial weight loss. 4. Raynaud's syndrome: Stop his beta-blocker, but he still has complaints.  We will add very low-dose amlodipine. 5. HTN: Good control, we will cut the ACE inhibitor in half so that his blood pressure does not drop too much after starting amlodipine.    Medication Adjustments/Labs and Tests Ordered: Current medicines are reviewed at length with the patient today.  Concerns regarding medicines are outlined above.  Orders Placed This Encounter  Procedures  . EKG 12-Lead   Meds ordered this encounter  Medications  . amLODipine (NORVASC) 5 MG tablet    Sig: Take 1 tablet  (5 mg total) by mouth daily.    Dispense:  90 tablet    Refill:  3  . lisinopril (ZESTRIL) 10 MG tablet    Sig: Take 1 tablet (10 mg total) by mouth daily.    Dispense:  90 tablet  Refill:  3    Patient Instructions  Medication Instructions:  START Amlodipine 5 mg once daily DECREASE the Lisinopril to 10 mg once daily  *If you need a refill on your cardiac medications before your next appointment, please call your pharmacy*   Lab Work: None ordered If you have labs (blood work) drawn today and your tests are completely normal, you will receive your results only by: Marland Kitchen MyChart Message (if you have MyChart) OR . A paper copy in the mail If you have any lab test that is abnormal or we need to change your treatment, we will call you to review the results.   Testing/Procedures: None ordered   Follow-Up: At [facility], you and your health needs are our priority.  As part of our continuing mission to provide you with exceptional heart care, we have created designated Provider Care Teams.  These Care Teams include your primary Cardiologist (physician) and Advanced Practice Providers (APPs -  Physician Assistants and Nurse Practitioners) who all work together to provide you with the care you need, when you need it.  We recommend signing up for the patient portal called "MyChart".  Sign up information is provided on this After Visit Summary.  MyChart is used to connect with patients for Virtual Visits (Telemedicine).  Patients are able to view lab/test results, encounter notes, upcoming appointments, etc.  Non-urgent messages can be sent to your provider as well.   To learn more about what you can do with MyChart, go to [email or web address].    Your next appointment:   12 month(s)  The format for your next appointment:   In Person  Provider:   You may see Thurmon Fair, MD or one of the following Advanced Practice Providers on your designated Care Team:    Azalee Course,  PA-C  Micah Flesher, [place] or   Judy Pimple, PA-C     Signed, Thurmon Fair, MD  03/09/2020 4:44 PM    [facility]

## 2020-03-04 NOTE — Patient Instructions (Signed)
Medication Instructions:  START Amlodipine 5 mg once daily DECREASE the Lisinopril to 10 mg once daily  *If you need a refill on your cardiac medications before your next appointment, please call your pharmacy*   Lab Work: None ordered If you have labs (blood work) drawn today and your tests are completely normal, you will receive your results only by: Marland Kitchen MyChart Message (if you have MyChart) OR . A paper copy in the mail If you have any lab test that is abnormal or we need to change your treatment, we will call you to review the results.   Testing/Procedures: None ordered   Follow-Up: At [facility], you and your health needs are our priority.  As part of our continuing mission to provide you with exceptional heart care, we have created designated Provider Care Teams.  These Care Teams include your primary Cardiologist (physician) and Advanced Practice Providers (APPs -  Physician Assistants and Nurse Practitioners) who all work together to provide you with the care you need, when you need it.  We recommend signing up for the patient portal called "MyChart".  Sign up information is provided on this After Visit Summary.  MyChart is used to connect with patients for Virtual Visits (Telemedicine).  Patients are able to view lab/test results, encounter notes, upcoming appointments, etc.  Non-urgent messages can be sent to your provider as well.   To learn more about what you can do with MyChart, go to [email or web address].    Your next appointment:   12 month(s)  The format for your next appointment:   In Person  Provider:   You may see Thurmon Fair, MD or one of the following Advanced Practice Providers on your designated Care Team:    Azalee Course, PA-C  Micah Flesher, PA-C or   Judy Pimple, [place]

## 2020-03-16 DIAGNOSIS — M545 Low back pain, unspecified: Secondary | ICD-10-CM | POA: Diagnosis not present

## 2020-03-16 DIAGNOSIS — M6281 Muscle weakness (generalized): Secondary | ICD-10-CM | POA: Diagnosis not present

## 2020-03-16 DIAGNOSIS — M2569 Stiffness of other specified joint, not elsewhere classified: Secondary | ICD-10-CM | POA: Diagnosis not present

## 2020-03-18 DIAGNOSIS — M6281 Muscle weakness (generalized): Secondary | ICD-10-CM | POA: Diagnosis not present

## 2020-03-18 DIAGNOSIS — M2569 Stiffness of other specified joint, not elsewhere classified: Secondary | ICD-10-CM | POA: Diagnosis not present

## 2020-03-18 DIAGNOSIS — M545 Low back pain, unspecified: Secondary | ICD-10-CM | POA: Diagnosis not present

## 2020-03-24 DIAGNOSIS — M545 Low back pain, unspecified: Secondary | ICD-10-CM | POA: Diagnosis not present

## 2020-03-24 DIAGNOSIS — M2569 Stiffness of other specified joint, not elsewhere classified: Secondary | ICD-10-CM | POA: Diagnosis not present

## 2020-03-24 DIAGNOSIS — M6281 Muscle weakness (generalized): Secondary | ICD-10-CM | POA: Diagnosis not present

## 2020-03-26 DIAGNOSIS — M6281 Muscle weakness (generalized): Secondary | ICD-10-CM | POA: Diagnosis not present

## 2020-03-26 DIAGNOSIS — M2569 Stiffness of other specified joint, not elsewhere classified: Secondary | ICD-10-CM | POA: Diagnosis not present

## 2020-03-26 DIAGNOSIS — M545 Low back pain, unspecified: Secondary | ICD-10-CM | POA: Diagnosis not present

## 2020-03-27 DIAGNOSIS — M545 Low back pain, unspecified: Secondary | ICD-10-CM | POA: Diagnosis not present

## 2020-03-27 DIAGNOSIS — M2569 Stiffness of other specified joint, not elsewhere classified: Secondary | ICD-10-CM | POA: Diagnosis not present

## 2020-03-27 DIAGNOSIS — M6281 Muscle weakness (generalized): Secondary | ICD-10-CM | POA: Diagnosis not present

## 2020-03-30 DIAGNOSIS — M6281 Muscle weakness (generalized): Secondary | ICD-10-CM | POA: Diagnosis not present

## 2020-03-30 DIAGNOSIS — M2569 Stiffness of other specified joint, not elsewhere classified: Secondary | ICD-10-CM | POA: Diagnosis not present

## 2020-03-30 DIAGNOSIS — M545 Low back pain, unspecified: Secondary | ICD-10-CM | POA: Diagnosis not present

## 2020-04-01 DIAGNOSIS — M545 Low back pain, unspecified: Secondary | ICD-10-CM | POA: Diagnosis not present

## 2020-04-01 DIAGNOSIS — M2569 Stiffness of other specified joint, not elsewhere classified: Secondary | ICD-10-CM | POA: Diagnosis not present

## 2020-04-01 DIAGNOSIS — M6281 Muscle weakness (generalized): Secondary | ICD-10-CM | POA: Diagnosis not present

## 2020-04-13 DIAGNOSIS — M6281 Muscle weakness (generalized): Secondary | ICD-10-CM | POA: Diagnosis not present

## 2020-04-13 DIAGNOSIS — M2569 Stiffness of other specified joint, not elsewhere classified: Secondary | ICD-10-CM | POA: Diagnosis not present

## 2020-04-13 DIAGNOSIS — M545 Low back pain, unspecified: Secondary | ICD-10-CM | POA: Diagnosis not present

## 2020-04-28 DIAGNOSIS — F900 Attention-deficit hyperactivity disorder, predominantly inattentive type: Secondary | ICD-10-CM | POA: Diagnosis not present

## 2020-04-29 DIAGNOSIS — M2569 Stiffness of other specified joint, not elsewhere classified: Secondary | ICD-10-CM | POA: Diagnosis not present

## 2020-04-29 DIAGNOSIS — M545 Low back pain, unspecified: Secondary | ICD-10-CM | POA: Diagnosis not present

## 2020-04-29 DIAGNOSIS — M6281 Muscle weakness (generalized): Secondary | ICD-10-CM | POA: Diagnosis not present

## 2020-04-30 DIAGNOSIS — N401 Enlarged prostate with lower urinary tract symptoms: Secondary | ICD-10-CM | POA: Diagnosis not present

## 2020-04-30 DIAGNOSIS — R82998 Other abnormal findings in urine: Secondary | ICD-10-CM | POA: Diagnosis not present

## 2020-04-30 DIAGNOSIS — R8290 Unspecified abnormal findings in urine: Secondary | ICD-10-CM | POA: Diagnosis not present

## 2020-04-30 DIAGNOSIS — N138 Other obstructive and reflux uropathy: Secondary | ICD-10-CM | POA: Diagnosis not present

## 2020-04-30 DIAGNOSIS — R972 Elevated prostate specific antigen [PSA]: Secondary | ICD-10-CM | POA: Diagnosis not present

## 2020-04-30 DIAGNOSIS — N411 Chronic prostatitis: Secondary | ICD-10-CM | POA: Diagnosis not present

## 2020-05-01 DIAGNOSIS — M545 Low back pain, unspecified: Secondary | ICD-10-CM | POA: Diagnosis not present

## 2020-05-01 DIAGNOSIS — M2569 Stiffness of other specified joint, not elsewhere classified: Secondary | ICD-10-CM | POA: Diagnosis not present

## 2020-05-01 DIAGNOSIS — M6281 Muscle weakness (generalized): Secondary | ICD-10-CM | POA: Diagnosis not present

## 2020-05-05 DIAGNOSIS — M6281 Muscle weakness (generalized): Secondary | ICD-10-CM | POA: Diagnosis not present

## 2020-05-05 DIAGNOSIS — M2569 Stiffness of other specified joint, not elsewhere classified: Secondary | ICD-10-CM | POA: Diagnosis not present

## 2020-05-05 DIAGNOSIS — M545 Low back pain, unspecified: Secondary | ICD-10-CM | POA: Diagnosis not present

## 2020-05-05 IMAGING — DX DG LUMBAR SPINE 2-3V
3 series · 3 of 3 positions shown · non-contrast
Comparison: No recent.

CLINICAL DATA: History of low back pain.

EXAM:
LUMBAR SPINE - 2-3 VIEW

[dg lumbar spine 2-3 views (1 of 3)]
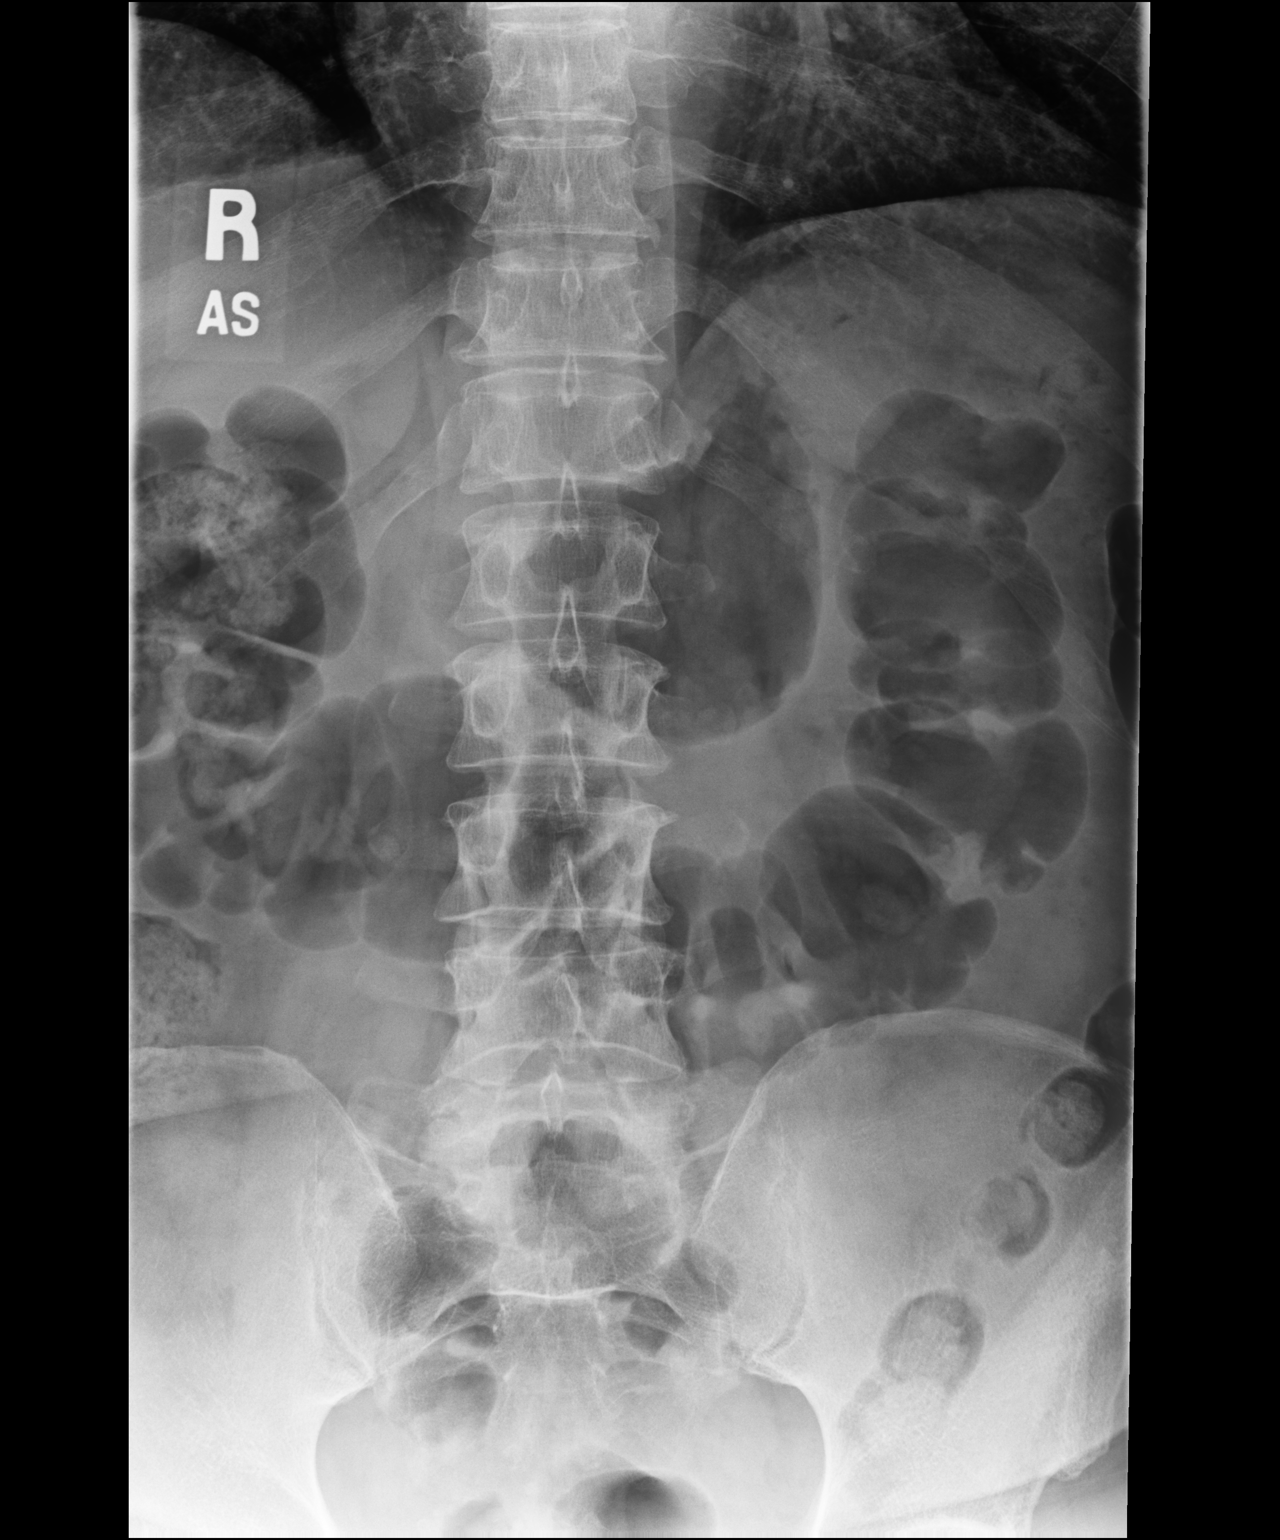

[dg lumbar spine 2-3 views (2 of 3)]
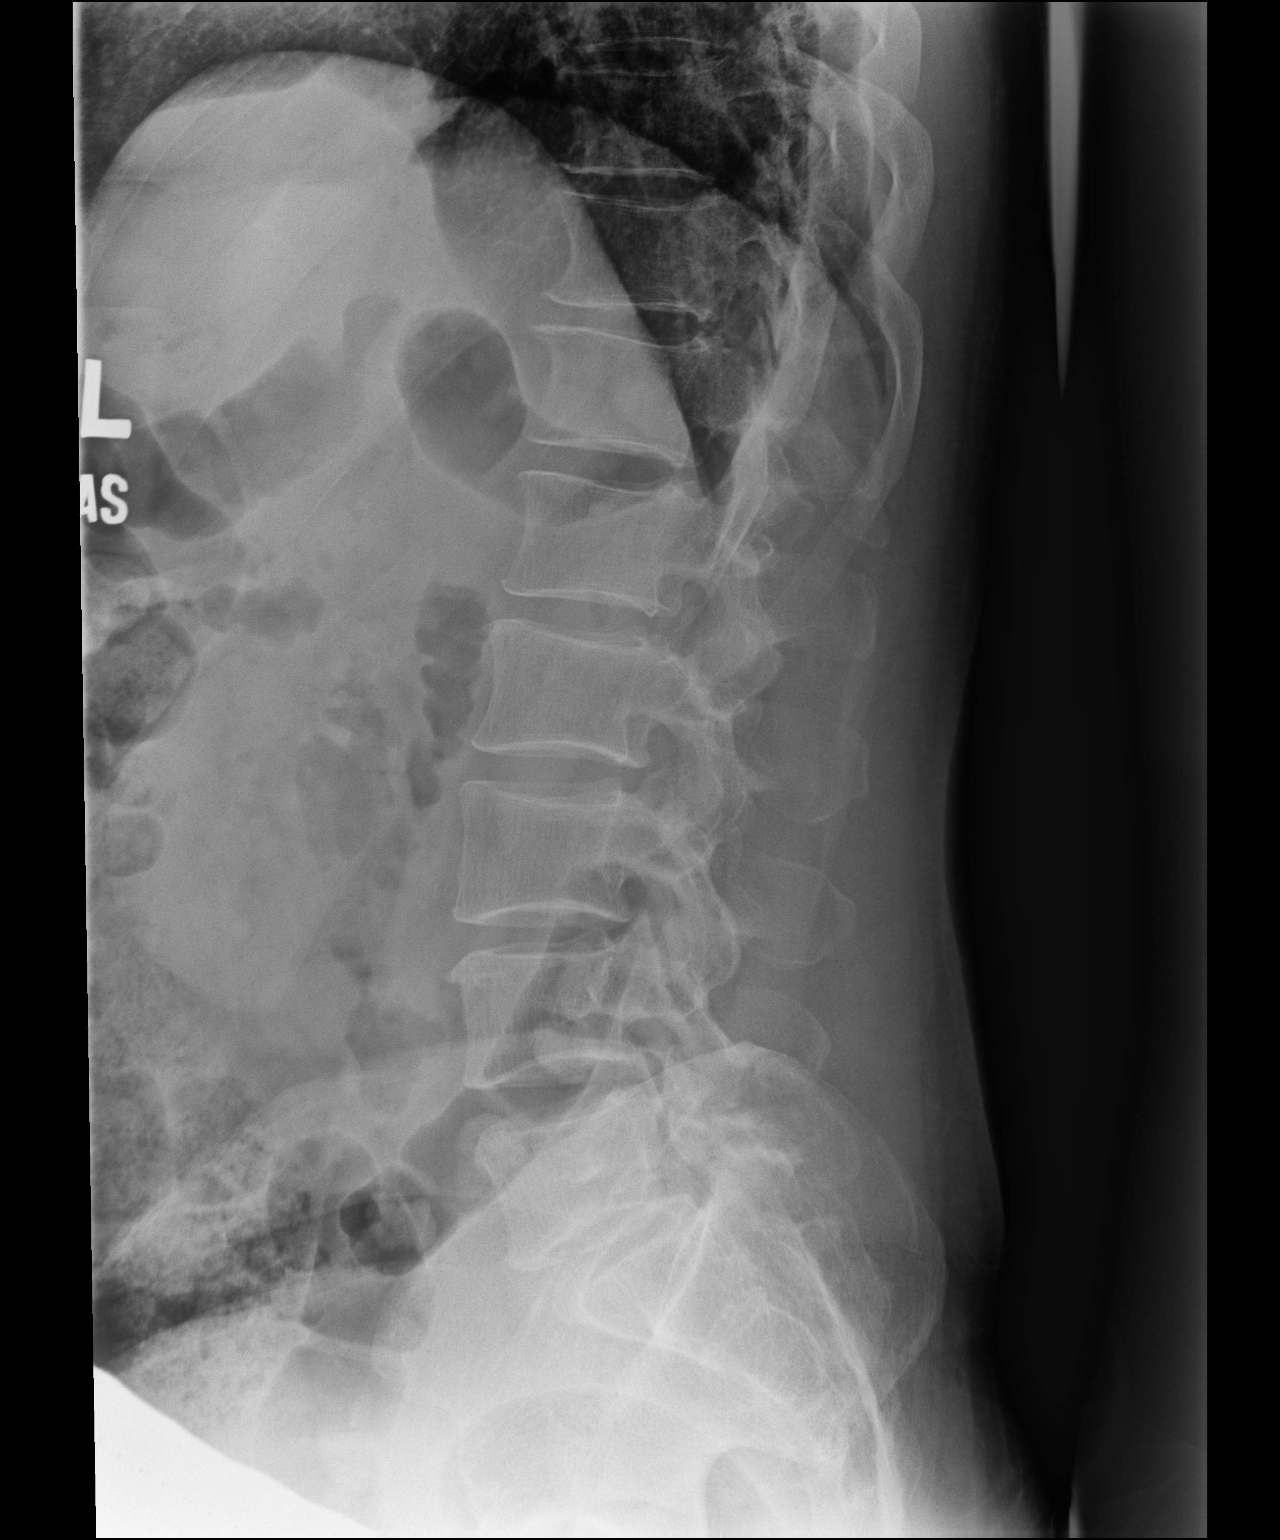

[dg lumbar spine 2-3 views (3 of 3)]
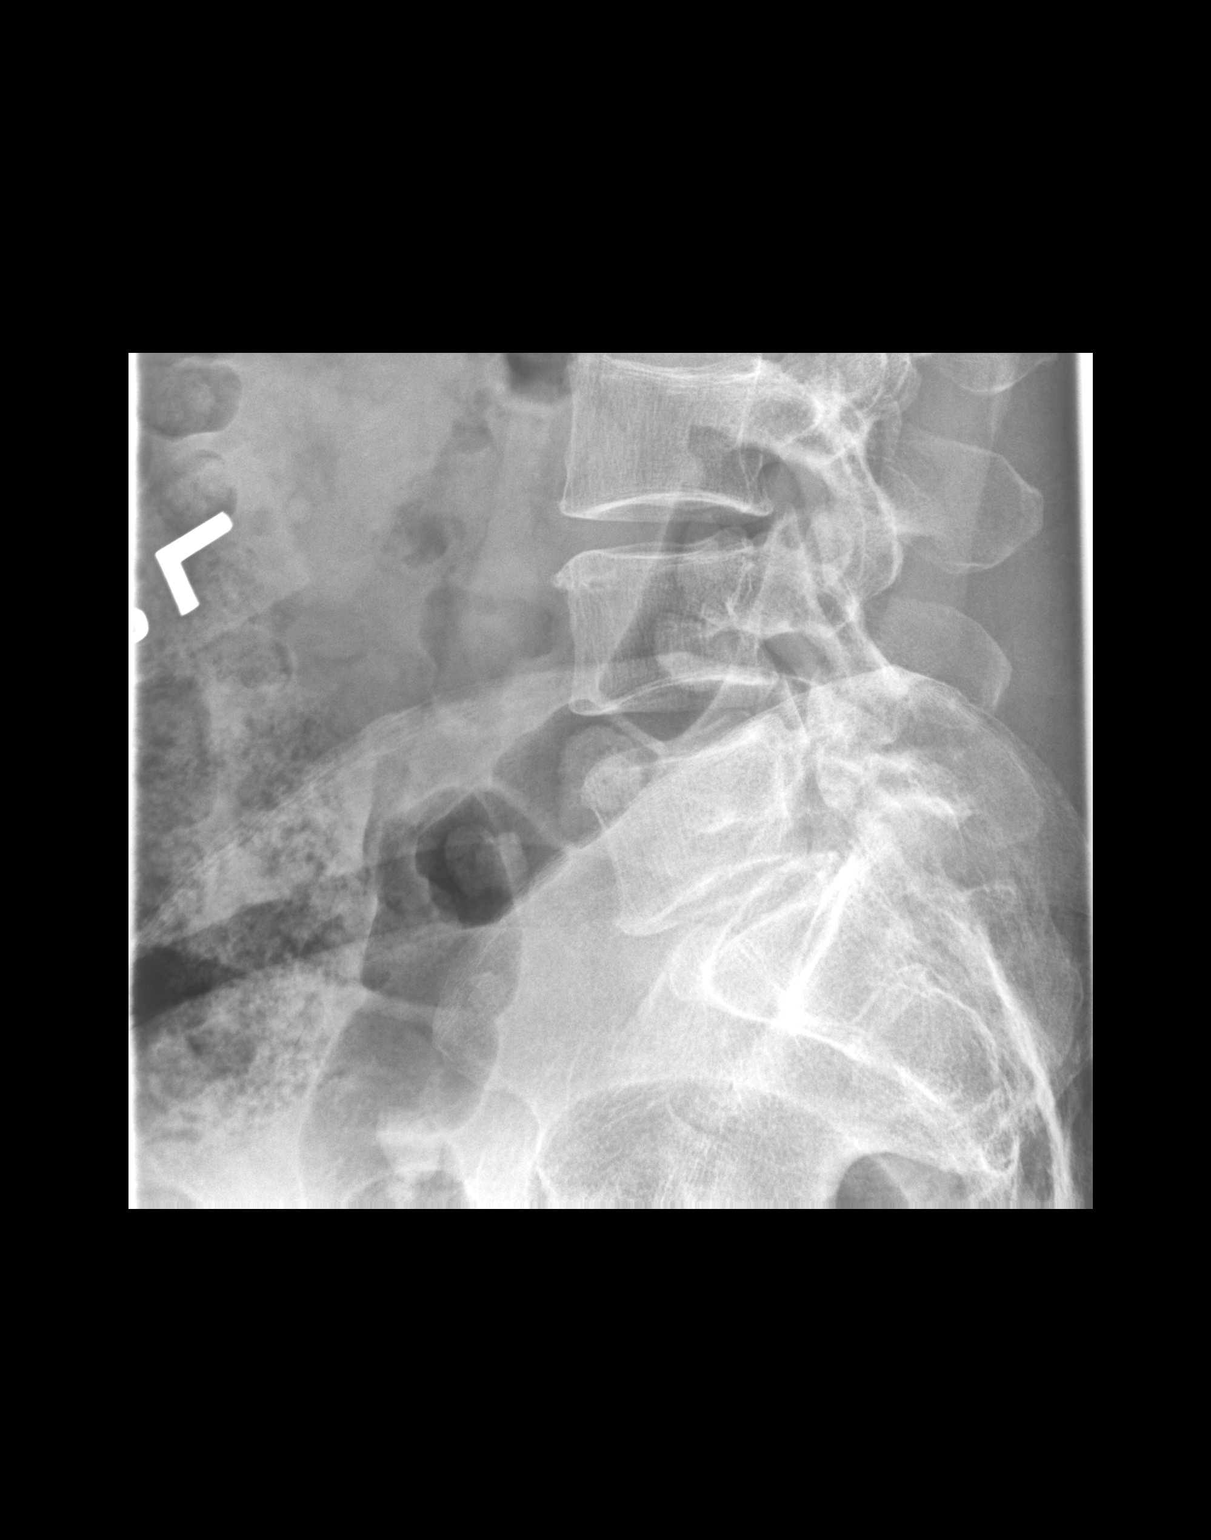

[3 of 3 positions shown; findings below may reference images not displayed]

FINDINGS: No acute bony abnormality identified. No evidence of fracture.
Diffuse degenerative change.
IMPRESSION: Diffuse degenerative change.  No acute abnormality.

## 2020-05-07 DIAGNOSIS — M2569 Stiffness of other specified joint, not elsewhere classified: Secondary | ICD-10-CM | POA: Diagnosis not present

## 2020-05-07 DIAGNOSIS — M6281 Muscle weakness (generalized): Secondary | ICD-10-CM | POA: Diagnosis not present

## 2020-05-07 DIAGNOSIS — M545 Low back pain, unspecified: Secondary | ICD-10-CM | POA: Diagnosis not present

## 2020-05-11 DIAGNOSIS — M545 Low back pain, unspecified: Secondary | ICD-10-CM | POA: Diagnosis not present

## 2020-05-11 DIAGNOSIS — M6281 Muscle weakness (generalized): Secondary | ICD-10-CM | POA: Diagnosis not present

## 2020-05-11 DIAGNOSIS — M2569 Stiffness of other specified joint, not elsewhere classified: Secondary | ICD-10-CM | POA: Diagnosis not present

## 2020-05-13 IMAGING — CR DG CHEST 2V
2 series · 2 of 2 positions shown · non-contrast
Comparison: None.

CLINICAL DATA: Two days of chest pain, worse when lying flat

EXAM:
CHEST - 2 VIEW

[w chest pa]
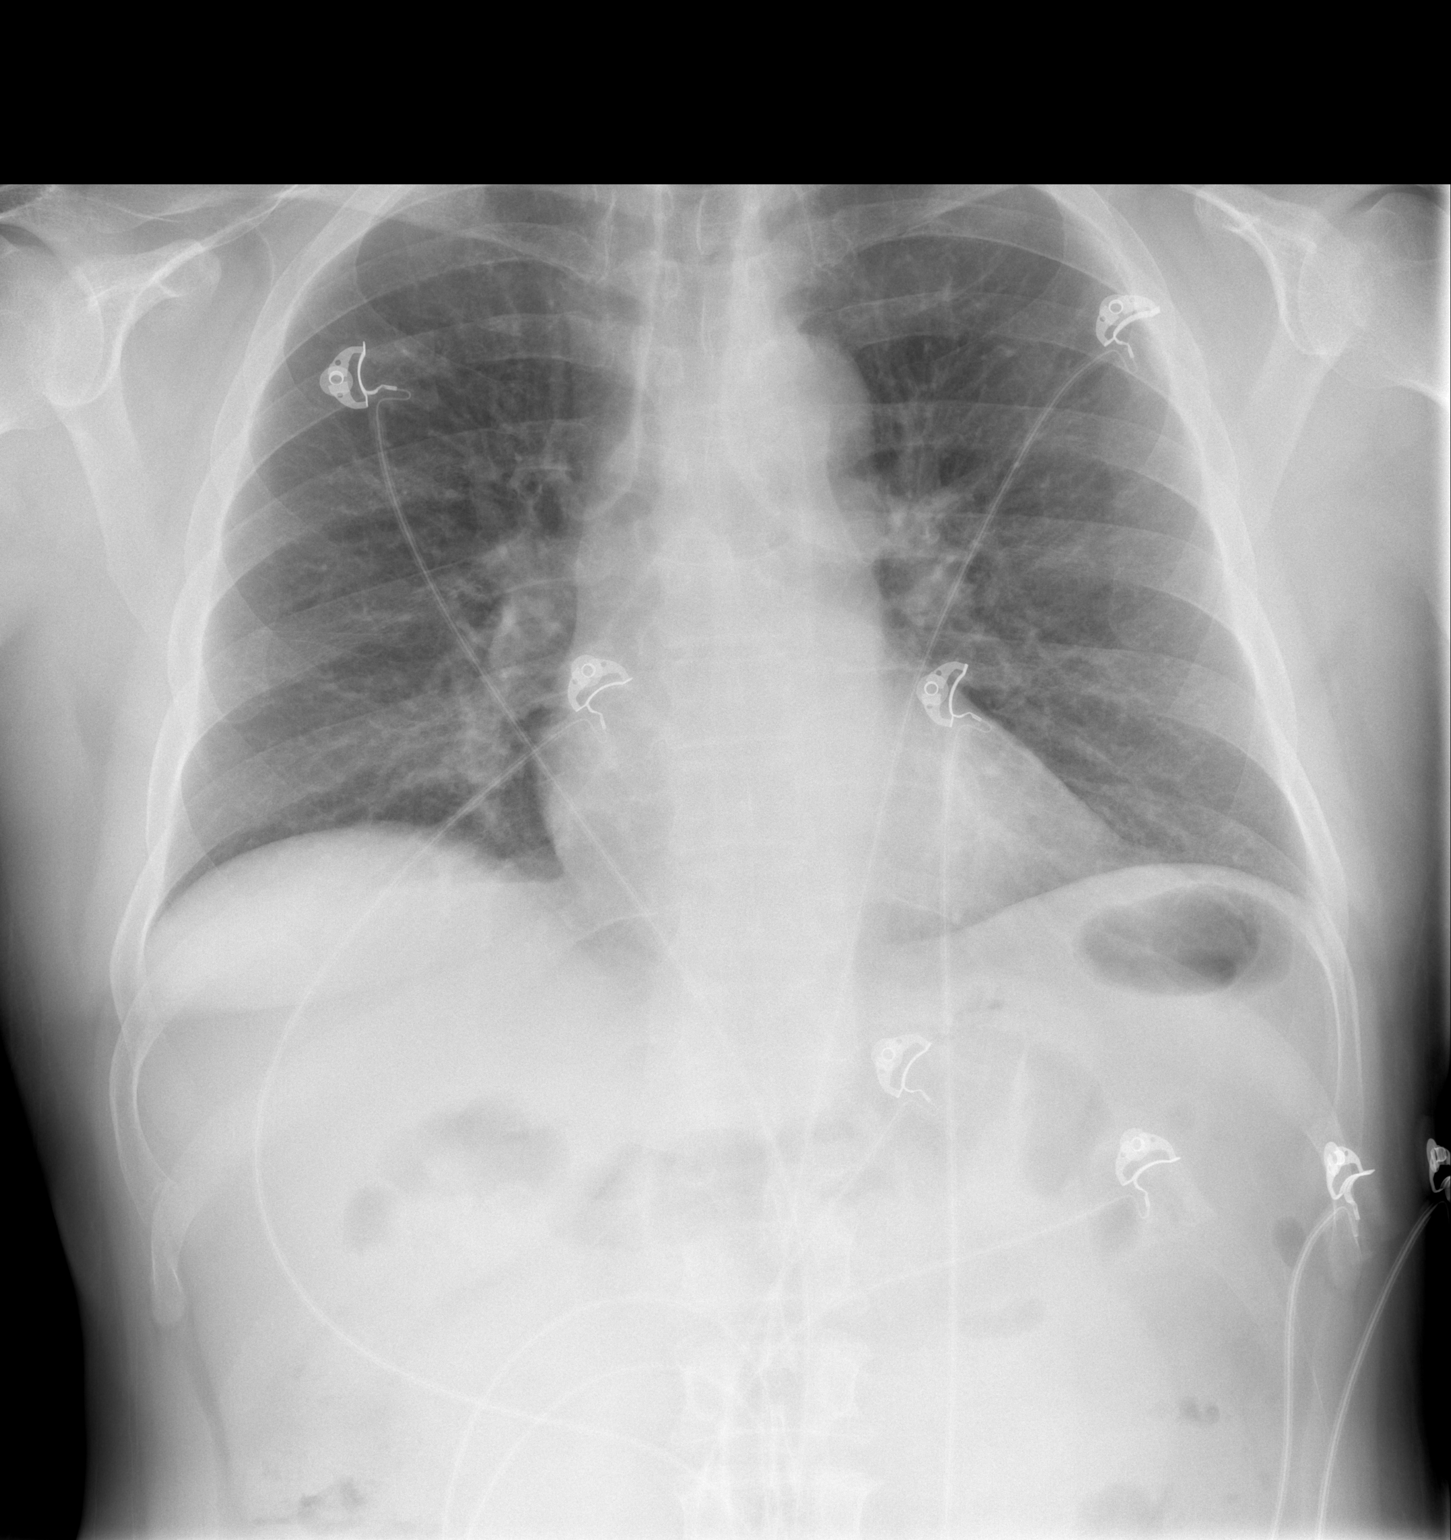

[w chest lat]
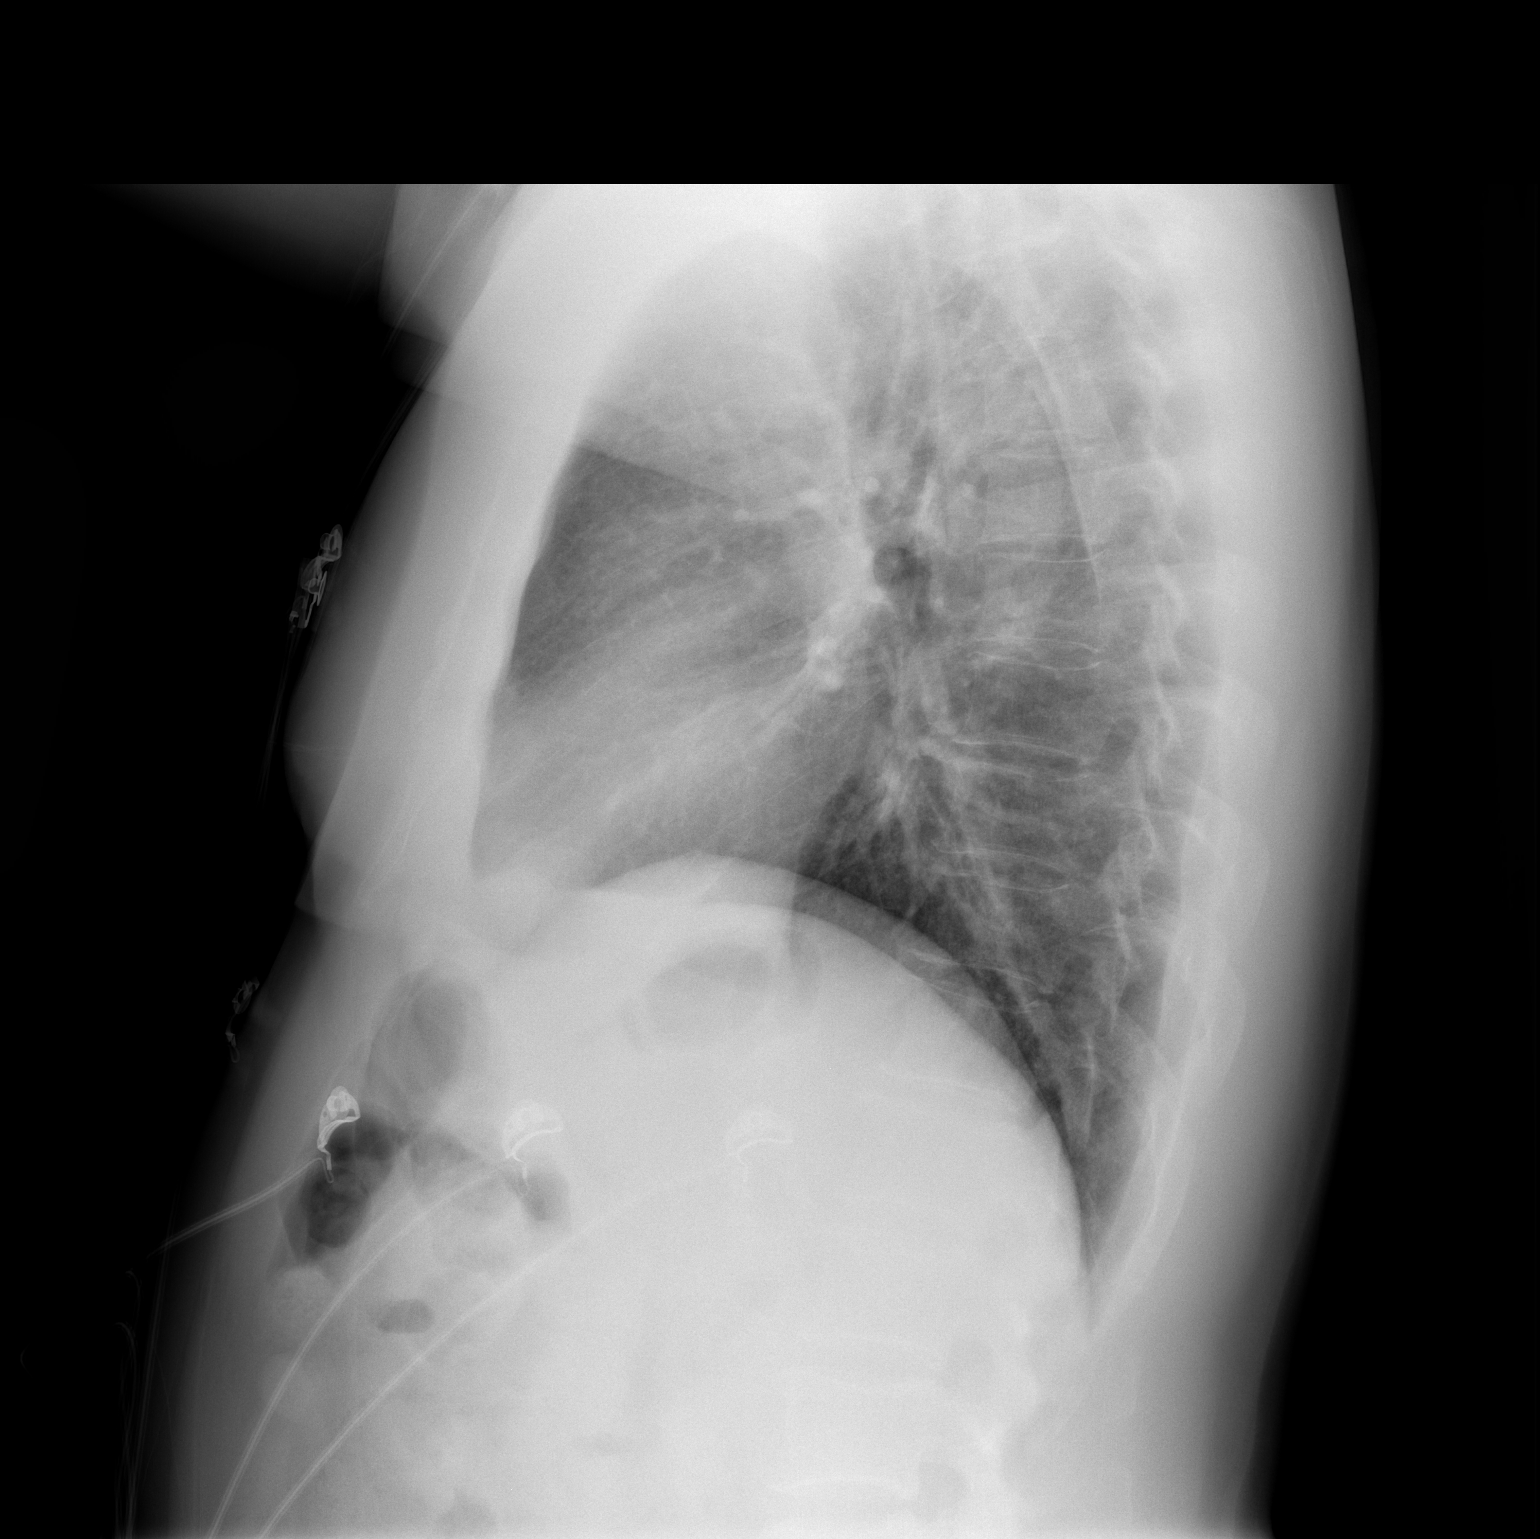

[2 of 2 positions shown; findings below may reference images not displayed]

FINDINGS: There is no focal parenchymal opacity. There is no pleural effusion
or pneumothorax. The heart and mediastinal contours are
unremarkable.

The osseous structures are unremarkable.
IMPRESSION: No active cardiopulmonary disease.

## 2020-05-14 DIAGNOSIS — M2569 Stiffness of other specified joint, not elsewhere classified: Secondary | ICD-10-CM | POA: Diagnosis not present

## 2020-05-14 DIAGNOSIS — M6281 Muscle weakness (generalized): Secondary | ICD-10-CM | POA: Diagnosis not present

## 2020-05-14 DIAGNOSIS — M545 Low back pain, unspecified: Secondary | ICD-10-CM | POA: Diagnosis not present

## 2020-05-20 DIAGNOSIS — M545 Low back pain, unspecified: Secondary | ICD-10-CM | POA: Diagnosis not present

## 2020-05-20 DIAGNOSIS — M2569 Stiffness of other specified joint, not elsewhere classified: Secondary | ICD-10-CM | POA: Diagnosis not present

## 2020-05-20 DIAGNOSIS — M6281 Muscle weakness (generalized): Secondary | ICD-10-CM | POA: Diagnosis not present

## 2020-05-22 DIAGNOSIS — M545 Low back pain, unspecified: Secondary | ICD-10-CM | POA: Diagnosis not present

## 2020-05-22 DIAGNOSIS — M2569 Stiffness of other specified joint, not elsewhere classified: Secondary | ICD-10-CM | POA: Diagnosis not present

## 2020-05-22 DIAGNOSIS — M6281 Muscle weakness (generalized): Secondary | ICD-10-CM | POA: Diagnosis not present

## 2020-05-29 ENCOUNTER — Ambulatory Visit (INDEPENDENT_AMBULATORY_CARE_PROVIDER_SITE_OTHER): Payer: BC Managed Care – PPO | Admitting: Family Medicine

## 2020-05-29 ENCOUNTER — Other Ambulatory Visit: Payer: Self-pay

## 2020-05-29 VITALS — BP 136/86 | Ht 68.0 in | Wt 181.0 lb

## 2020-05-29 DIAGNOSIS — M25531 Pain in right wrist: Secondary | ICD-10-CM | POA: Diagnosis not present

## 2020-05-29 DIAGNOSIS — M545 Low back pain, unspecified: Secondary | ICD-10-CM | POA: Diagnosis not present

## 2020-05-29 DIAGNOSIS — M6281 Muscle weakness (generalized): Secondary | ICD-10-CM | POA: Diagnosis not present

## 2020-05-29 DIAGNOSIS — M2569 Stiffness of other specified joint, not elsewhere classified: Secondary | ICD-10-CM | POA: Diagnosis not present

## 2020-05-29 MED ORDER — NITROGLYCERIN 0.2 MG/HR TD PT24: MEDICATED_PATCH | TRANSDERMAL | 1 refills | Status: DC

## 2020-05-29 NOTE — Progress Notes (Signed)
[facility] Attending Note: I have seen and examined this patient. I have discussed this patient with the resident and reviewed the assessment and plan as documented above. I agree with the resident's findings and plan.   Right wrist pain: Most likely related to overuse injury when he was helping his daughter pain her apartment.  He is tender over the right extensor digitorum bundle.  Ultrasound here reveals some excessive fluid in the tendon sheaths.  The tendons himself seem to be intact.  Extensor tendon strain: Immobilization in a cock-up wrist splint.  Ice twice daily.  He has previously used a nitroglycerin patch and asked if that would help him heal quicker and it may so we decided to try that as well.  He will follow-up in 2 weeks, sooner with problems.

## 2020-05-29 NOTE — Patient Instructions (Signed)
Cut patch into one - fourth pieces Place a one fourth piece of patch on  skin over affected area, changing to a new piece every 24 hours.   You may experience a headache during the first 1-2 days and maybe up to 2 weeks of using the patch; these should improve and go away. If you experience headaches after beginning nitroglycerin patch treatment, you may take your preferred over the counter pain medicine such as tylenol or advil as directed on the box. Another side effect of the nitroglycerin patch can be skin irritation  or rash related to the patch adhesive.   Please notify our office if you develop  severe headaches or rash, and stop the patch.  Please call our office with any questions or problems. Tendon healing with nitroglycerin patch may require 12 to 24 weeks depending on the extent of injury. Men should not use if taking Viagra, Cialis, or Levitra as it may cause an unsafe lowering of the blood pressure..  Use with caution if you have migraines or rosacea. RTC 2-3 weeks

## 2020-05-29 NOTE — Progress Notes (Signed)
   PCP: Kristian Covey, MD  Subjective:   HPI: Patient is a 60 y.o. male here for left wrist pain.  Left wrist pain Damon [name] reports that he has been having left wrist pain for the past 3 to half weeks.  He does not report any specific trauma.  He is not aware of any specific repetitive motions although he did help his daughter paint a room recently and wonders if that may have caused his symptoms.  He describes the pain as a dull muscular discomfort over the back of his hand and sometimes in his upper forearm.  He notices this with many of his daily activities with actions as minimal as turning a doorknob or using his phone.  His wrist pain does not seem to be related to movement but instead be related to the movement of his other fingers.  Is noticed that it gets quite a bit worse when he tries to do planks while resting on his knuckles.  He did attempt to have a dry needling done on his forearm 2 weeks ago which did not provide any significant improvement.  Review of Systems:  Per HPI.   PMFSH, medications and smoking status reviewed.      Objective:  Physical Exam:  Gen: awake, alert, NAD, comfortable in exam room Pulm: breathing unlabored  Hand: Inspection: No obvious deformity. No swelling, erythema or bruising Palpation: Mild tenderness to palpation over the dorsal wrist.  No significant tenderness with palpation of any of the fingers or muscle bellies of the flexor and extensor tendons.  He had no tenderness with palpation of the medial or lateral epicondyles of the elbow. ROM: Full ROM of the digits and wrist. Fully able to extend and flex finger. Strength: 5/5 strength in the forearm, wrist and interosseus muscles Neurovascular: NV intact Special tests: Negative finkelstein's, significant pain elicited in the dorsal wrist with forceful flexion of the wrist during the Phalen's test.  Fingers:  No swelling in PIP, DIP joints.  Ultrasound Increased fluid in the fourth  compartment of the wrist.    Assessment & Plan:  1.  Left wrist pain History, physical exam and ultrasound are consistent with inflammation of the extensor digitorum tendons.  Low suspicion for fracture or bony abnormality based on lack of trauma.  His history is most suspicious for repetitive stress injury.  This is likely from the painting that he referred to.  For now, we will move forward with daily icing, splinting for immobilization and nitro patches for the next 2 weeks.  Return to clinic in 2-3 weeks to assess for improvement.   Damon Mo, MD PGY-3 05/29/2020 12:06 PM

## 2020-06-10 ENCOUNTER — Telehealth: Payer: Self-pay | Admitting: Cardiovascular Disease

## 2020-06-10 NOTE — Telephone Encounter (Signed)
Spoke with pt. He state at last OV he thought Dr. Salena Saner wanted him to decrease lisinopril to 5 mg daily ,then eventually wean off medication. He state for the last month he has not been taking it.  Pt made aware that per chart review, MD recommended to only decrease lisinopril to 5 mg daily. Pt is questioning if Dr. Salena Saner would like him to restart medication.  He report he does not check BP regularly at home, but last OV on 4/8 with Dr. Jennette Kettle  BP was 136/86.  Will forward to MD for approval

## 2020-06-10 NOTE — Telephone Encounter (Signed)
We cut back the lisinopril to introduce amlodipine for probable Raynaud's syndrome (by the way, did it help?). Our goal for his BP is 130/80 or less.  So I would continue the lisinopril 5 mg daily if BP is higher than that.

## 2020-06-10 NOTE — Telephone Encounter (Signed)
    Pt c/o medication issue:  1. Name of Medication:   amLODipine (NORVASC) 5 MG tablet    lisinopril (ZESTRIL) 10 MG tablet    2. How are you currently taking this medication (dosage and times per day)?  3. Are you having a reaction (difficulty breathing--STAT)?   4. What is your medication issue? Pt said per his last visit with Dr. Salena Saner he was started to take amlodipine and wean off lisinopril. But he doesn't know if the medication is working. He also wanted to make sure if he needs to stop lisinopril or continue taking it.  He would like to speak with a nurse

## 2020-06-10 NOTE — Telephone Encounter (Signed)
Left message to call back  

## 2020-06-12 NOTE — Telephone Encounter (Signed)
Pt updated and verbalized understanding.  

## 2020-06-19 ENCOUNTER — Ambulatory Visit (INDEPENDENT_AMBULATORY_CARE_PROVIDER_SITE_OTHER): Payer: BC Managed Care – PPO | Admitting: Family Medicine

## 2020-06-19 ENCOUNTER — Ambulatory Visit
Admission: RE | Admit: 2020-06-19 | Discharge: 2020-06-19 | Disposition: A | Discharge status: Home / Self Care | Payer: BC Managed Care – PPO | Source: Ambulatory Visit | Attending: Family Medicine | Admitting: Family Medicine

## 2020-06-19 ENCOUNTER — Other Ambulatory Visit: Payer: Self-pay

## 2020-06-19 VITALS — BP 124/80

## 2020-06-19 DIAGNOSIS — M25522 Pain in left elbow: Secondary | ICD-10-CM

## 2020-06-19 DIAGNOSIS — M25531 Pain in right wrist: Secondary | ICD-10-CM

## 2020-06-19 NOTE — Progress Notes (Signed)
   PCP: Kristian Covey, MD  Subjective:   HPI: Patient is a 60 y.o. male here for follow-up regarding right wrist pain.  Extensor tendinitis Mr. Leon was last seen in clinic about 2 weeks ago.  At that time, he was encouraged to wear a wrist splint, apply nitro patches and ice 2 times daily.  He reports that he has not been icing and that it is difficult for him to wear his wrist splint for more than 2 hours/day due to discomfort.  He has been able to use the nitro patches and he has experienced significant improvement although he is not yet totally back to baseline.  Left forearm pain He notices that he has position.  Pain with some moderate forearm pain when you pushes or hits things with his forearm.  This is been going on for several months now.  He notices it most when he is trying to shut the car door with the side of his arm or leaning on his arm while seated in the car.Marland Kitchen  He does not remember any traumatic injury or specific injury to his arm.  He describes it as an aching sensation just past his elbow.  He has not realized that swelling and bruising.  This is not usually bother him during daily activities or using his hands or shoulders.  He is concerned that his bones may be derived from the inside.  Review of Systems:  Per HPI.   PMFSH, medications and smoking status reviewed.      Objective:  Physical Exam:  No flowsheet data found.   Gen: awake, alert, NAD, comfortable in exam room Pulm: breathing unlabored  Right wrist  inspection: No obvious deformity or abnormality, no skin changes. Palpation: no significant tenderness with palpation. ROM: good active range of motion.  Mild discomfort with forced flexion of the wrist. Neurovascularly intact Full active range of motion with finger flexion and extension.  Elbow Inspection: No evidence of swelling or notable skin changes Palpation: No notable tenderness with palpation of the lateral and medial epicondyles.   Difficult to reproduce tenderness with percussion of his ulna. ROM: Full active ROM of his elbow    Assessment & Plan:  1.  Wrist flexor tendinitis Improving nicely.  He has been having trouble using his wrist splint so he was told he did not need to use his wrist Motrin.  He was encouraged to continue using his nitro patches and ice twice daily.  He was also provided wrist strengthening exercises today.  Return to clinic in 2-3 weeks for reassessment.  2.  Left forearm pain No evidence of olecranon bursitis, apophysitis of the lateral or medial epicondyles.  No significant tenderness with the muscle bodies of the flexors and extensors.  Very difficult to reproduce any significant tenderness to the ulna.  Mr. Doyce Para does seem concerned about this issue so we will move forward with a left forearm x-ray to ensure no bony pathology.  Mirian Mo, MD PGY-3 06/19/2020 10:04 AM

## 2020-06-19 NOTE — Patient Instructions (Signed)
Wrist pain: I am glad to hear this has been improving.  I am sorry hear that you did not have luck with your wrist splint.  It is okay to stop using your splint.  Continue using the nitro patches as prescribed and consider icing once or twice daily.  Lets follow-up in 2-3 weeks and see how you are doing.  Left elbow pain: Its not perfectly clear what is going on with your elbow.  Does sound like you have a little bit of inflammation although there are no significant red flags with your history or physical exam.  We will get an x-ray to see if there are any bony issues.  Follow-up in 2-3 weeks

## 2020-06-19 NOTE — Progress Notes (Signed)
[facility] Attending Note: I have seen and examined this patient. I have discussed this patient with the resident and reviewed the assessment and plan as documented above. I agree with the resident's findings and plan. #1.  Right hand/wrist/forearm pain consistent with extensor tendinitis.  He has been using the nitroglycerin patch for 2 weeks and is significantly better.  The brace has not been comfortable so has not been wearing it much.  We will continue nitroglycerin patch.  Added some wrist curls in flexion and extension with 1 pound weight.  Follow-up 3 to 4 weeks. 2.  New issue of left ulnar-sided proximal forearm pain when he leans against something or uses the arm to shut a door.  Exam is benign.  Will get x-ray.  Could be occult stress fracture I suppose.  X-ray will help [place] with that and will also rule out any unusual bony lesion.  When he follows up in 3 to 4 weeks if he still has this issue, we can discuss further work-up if necessary.  Right now the symptoms are aggravating but mild.

## 2020-06-23 ENCOUNTER — Encounter: Payer: Self-pay | Admitting: Family Medicine

## 2020-06-23 NOTE — Progress Notes (Signed)
Normal. Letter sent.

## 2020-07-07 DIAGNOSIS — M2569 Stiffness of other specified joint, not elsewhere classified: Secondary | ICD-10-CM | POA: Diagnosis not present

## 2020-07-07 DIAGNOSIS — M545 Low back pain, unspecified: Secondary | ICD-10-CM | POA: Diagnosis not present

## 2020-07-07 DIAGNOSIS — M6281 Muscle weakness (generalized): Secondary | ICD-10-CM | POA: Diagnosis not present

## 2020-07-09 DIAGNOSIS — M6281 Muscle weakness (generalized): Secondary | ICD-10-CM | POA: Diagnosis not present

## 2020-07-09 DIAGNOSIS — M545 Low back pain, unspecified: Secondary | ICD-10-CM | POA: Diagnosis not present

## 2020-07-09 DIAGNOSIS — M2569 Stiffness of other specified joint, not elsewhere classified: Secondary | ICD-10-CM | POA: Diagnosis not present

## 2020-07-10 ENCOUNTER — Encounter: Payer: Self-pay | Admitting: Family Medicine

## 2020-07-10 ENCOUNTER — Ambulatory Visit (INDEPENDENT_AMBULATORY_CARE_PROVIDER_SITE_OTHER): Payer: BC Managed Care – PPO | Admitting: Family Medicine

## 2020-07-10 ENCOUNTER — Other Ambulatory Visit: Payer: Self-pay

## 2020-07-10 VITALS — BP 122/82

## 2020-07-10 DIAGNOSIS — M25522 Pain in left elbow: Secondary | ICD-10-CM

## 2020-07-10 DIAGNOSIS — M25531 Pain in right wrist: Secondary | ICD-10-CM | POA: Diagnosis not present

## 2020-07-10 NOTE — Progress Notes (Signed)
   PCP: Kristian Covey, MD  Subjective:   HPI: Patient is a 60 y.o. male here for follow-up on right wrist pain.  He was diagnosed with extensor tendinitis in 05/29/2020 which he sustained after doing extensive house painting.  He started on anti-inflammatories, nitroglycerin patch, was initially immobilized.  He has had slow progressive improvement in his symptoms, however he does continue to have some dorsal wrist pain intermittently.  He has been intermittently using nitroglycerin patches.  He is also been doing home physical therapy exercises as instructed.  He did recently see an occupational hand therapist while he was at his PT appointment for his back, and they fashioned him a wrist brace which he feels has been very helpful.  He is wondering if he could see that OT for further treatments.  Patient also seen last time for his left proximal ulnar pain.  X-rays were obtained and were normal.  Today, he notes that he believes it is because of the way he opens his car door, and it is not too bothersome to him at this point.  Review of Systems:  Per HPI.   PMFSH, medications and smoking status reviewed.      Objective:  Physical Exam:  No flowsheet data found.   Gen: awake, alert, NAD, comfortable in exam room Pulm: breathing unlabored  Right wrist:  Inspection: No evidence of erythema, ecchymosis, swelling, edema.  ROM: Intact ROM to wrist flexion/extension, ulnar/radial deviation w/o pain  Strength: 5/5 strength to resisted wrist flexion/extension, ulnar/radial deviation.  Mild pain with resisted dorsiflexion Palpation: No tenderness to palpation to distal radius/ulna, DRUJ, scaphoid, scapholunate joint, TFCC.  Very mildly tender to palpation diffusely across metacarpal dorsally Special tests: Neg axial load to scaphoid. Neg mid-carpal shift. Neg Watson's test    Assessment & Plan:  1.  Right wrist extensor tendinitis Continues to slowly improve with time.  Given that he has  had benefit after an initial visit with a occupational hand therapist, we will plan to send in a formal referral for treatment.  At this point, we will follow-up as needed if he does not continue to have slow improvement or if he has any new concerns.  2.  Left elbow pain Suspect secondary to repeated trauma with the way he opens his truck door with his elbow.  Discussed alternative ergonomic strategies.  Discussed x-ray results and that they are reassuring.  Follow-up as needed for this.   Guy Sandifer, MD [facility] 07/10/2020 11:05 AM

## 2020-07-10 NOTE — Progress Notes (Signed)
[facility]: Attending Note: I have reviewed the chart, discussed wit the Sports Medicine Fellow. I agree with assessment and treatment plan as detailed in the Fellow's note. Wrist is improved.  I agree with referral to hand therapy.  He will follow-up as needed.

## 2020-07-13 ENCOUNTER — Telehealth: Payer: Self-pay | Admitting: Cardiovascular Disease

## 2020-07-13 MED ORDER — TAMSULOSIN HCL 0.4 MG PO CAPS: 0.4000 mg | ORAL_CAPSULE | Freq: Every day | ORAL | 3 refills | Status: DC

## 2020-07-13 MED ORDER — ATORVASTATIN CALCIUM 80 MG PO TABS: ORAL_TABLET | ORAL | 3 refills | Status: DC

## 2020-07-13 MED ORDER — EZETIMIBE 10 MG PO TABS: 10.0000 mg | ORAL_TABLET | Freq: Every day | ORAL | 3 refills | Status: DC

## 2020-07-13 NOTE — Telephone Encounter (Signed)
*  STAT* If patient is at the pharmacy, call can be transferred to refill team.   1. Which medications need to be refilled? (please list name of each medication and dose if known)   2. Which pharmacy/location (including street and city if local pharmacy) is medication to be sent to? [facility] [place], [place] - [address]  3. Do they need a 30 day or 90 day supply? 90 day   PT is requesting that all his medications be refilled.

## 2020-07-16 ENCOUNTER — Telehealth: Payer: Self-pay | Admitting: Cardiovascular Disease

## 2020-07-16 MED ORDER — AMLODIPINE BESYLATE 5 MG PO TABS: 5.0000 mg | ORAL_TABLET | Freq: Every day | ORAL | 3 refills | Status: DC

## 2020-07-16 NOTE — Telephone Encounter (Signed)
New message:     Patient calling to see if he could speak to the nurse concering some medications he would like a list of his medication that the doctor gives him.

## 2020-07-16 NOTE — Telephone Encounter (Signed)
*  STAT* If patient is at the pharmacy, call can be transferred to refill team.   1. Which medications need to be refilled? (please list name of each medication and dose if known) amlodipine 5 mg  2. Which pharmacy/location (including street and city if local pharmacy) is medication to be sent to? [facility]  3. Do they need a 30 day or 90 day supply? 90

## 2020-07-16 NOTE — Telephone Encounter (Signed)
The patient was calling for refills.   Refill sent for Amlodipine to [facility]

## 2020-07-17 DIAGNOSIS — M25531 Pain in right wrist: Secondary | ICD-10-CM | POA: Diagnosis not present

## 2020-07-17 DIAGNOSIS — R531 Weakness: Secondary | ICD-10-CM | POA: Diagnosis not present

## 2020-07-17 DIAGNOSIS — M25631 Stiffness of right wrist, not elsewhere classified: Secondary | ICD-10-CM | POA: Diagnosis not present

## 2020-07-17 DIAGNOSIS — M25431 Effusion, right wrist: Secondary | ICD-10-CM | POA: Diagnosis not present

## 2020-07-21 DIAGNOSIS — M25431 Effusion, right wrist: Secondary | ICD-10-CM | POA: Diagnosis not present

## 2020-07-21 DIAGNOSIS — R531 Weakness: Secondary | ICD-10-CM | POA: Diagnosis not present

## 2020-07-21 DIAGNOSIS — M25531 Pain in right wrist: Secondary | ICD-10-CM | POA: Diagnosis not present

## 2020-07-21 DIAGNOSIS — M25631 Stiffness of right wrist, not elsewhere classified: Secondary | ICD-10-CM | POA: Diagnosis not present

## 2020-07-24 DIAGNOSIS — R531 Weakness: Secondary | ICD-10-CM | POA: Diagnosis not present

## 2020-07-24 DIAGNOSIS — M25531 Pain in right wrist: Secondary | ICD-10-CM | POA: Diagnosis not present

## 2020-07-24 DIAGNOSIS — M25431 Effusion, right wrist: Secondary | ICD-10-CM | POA: Diagnosis not present

## 2020-07-24 DIAGNOSIS — M25631 Stiffness of right wrist, not elsewhere classified: Secondary | ICD-10-CM | POA: Diagnosis not present

## 2020-07-28 DIAGNOSIS — M25431 Effusion, right wrist: Secondary | ICD-10-CM | POA: Diagnosis not present

## 2020-07-28 DIAGNOSIS — M25531 Pain in right wrist: Secondary | ICD-10-CM | POA: Diagnosis not present

## 2020-07-28 DIAGNOSIS — M25631 Stiffness of right wrist, not elsewhere classified: Secondary | ICD-10-CM | POA: Diagnosis not present

## 2020-07-28 DIAGNOSIS — F900 Attention-deficit hyperactivity disorder, predominantly inattentive type: Secondary | ICD-10-CM | POA: Diagnosis not present

## 2020-07-28 DIAGNOSIS — R531 Weakness: Secondary | ICD-10-CM | POA: Diagnosis not present

## 2020-08-07 ENCOUNTER — Telehealth: Payer: Self-pay | Admitting: Family Medicine

## 2020-08-07 DIAGNOSIS — M25631 Stiffness of right wrist, not elsewhere classified: Secondary | ICD-10-CM | POA: Diagnosis not present

## 2020-08-07 DIAGNOSIS — M25531 Pain in right wrist: Secondary | ICD-10-CM | POA: Diagnosis not present

## 2020-08-07 DIAGNOSIS — M25431 Effusion, right wrist: Secondary | ICD-10-CM | POA: Diagnosis not present

## 2020-08-07 DIAGNOSIS — R531 Weakness: Secondary | ICD-10-CM | POA: Diagnosis not present

## 2020-08-07 NOTE — Telephone Encounter (Signed)
Pt call and stated he want to know will dr.Burchette take on his sister ask a pt.pt want a call back.

## 2020-08-13 DIAGNOSIS — F411 Generalized anxiety disorder: Secondary | ICD-10-CM | POA: Diagnosis not present

## 2020-08-14 DIAGNOSIS — M25631 Stiffness of right wrist, not elsewhere classified: Secondary | ICD-10-CM | POA: Diagnosis not present

## 2020-08-14 DIAGNOSIS — R531 Weakness: Secondary | ICD-10-CM | POA: Diagnosis not present

## 2020-08-14 DIAGNOSIS — M25431 Effusion, right wrist: Secondary | ICD-10-CM | POA: Diagnosis not present

## 2020-08-14 DIAGNOSIS — M25531 Pain in right wrist: Secondary | ICD-10-CM | POA: Diagnosis not present

## 2020-08-18 DIAGNOSIS — F411 Generalized anxiety disorder: Secondary | ICD-10-CM | POA: Diagnosis not present

## 2020-09-01 DIAGNOSIS — Z630 Problems in relationship with spouse or partner: Secondary | ICD-10-CM | POA: Diagnosis not present

## 2020-09-01 DIAGNOSIS — F411 Generalized anxiety disorder: Secondary | ICD-10-CM | POA: Diagnosis not present

## 2020-09-23 DIAGNOSIS — F411 Generalized anxiety disorder: Secondary | ICD-10-CM | POA: Diagnosis not present

## 2020-09-23 DIAGNOSIS — Z630 Problems in relationship with spouse or partner: Secondary | ICD-10-CM | POA: Diagnosis not present

## 2020-09-29 DIAGNOSIS — D225 Melanocytic nevi of trunk: Secondary | ICD-10-CM | POA: Diagnosis not present

## 2020-09-29 DIAGNOSIS — L814 Other melanin hyperpigmentation: Secondary | ICD-10-CM | POA: Diagnosis not present

## 2020-09-29 DIAGNOSIS — L821 Other seborrheic keratosis: Secondary | ICD-10-CM | POA: Diagnosis not present

## 2020-09-29 DIAGNOSIS — D2272 Melanocytic nevi of left lower limb, including hip: Secondary | ICD-10-CM | POA: Diagnosis not present

## 2020-10-12 DIAGNOSIS — F900 Attention-deficit hyperactivity disorder, predominantly inattentive type: Secondary | ICD-10-CM | POA: Diagnosis not present

## 2020-10-20 DIAGNOSIS — F411 Generalized anxiety disorder: Secondary | ICD-10-CM | POA: Diagnosis not present

## 2020-10-20 DIAGNOSIS — Z630 Problems in relationship with spouse or partner: Secondary | ICD-10-CM | POA: Diagnosis not present

## 2020-11-03 DIAGNOSIS — F411 Generalized anxiety disorder: Secondary | ICD-10-CM | POA: Diagnosis not present

## 2020-11-04 ENCOUNTER — Telehealth: Payer: Self-pay | Admitting: Cardiovascular Disease

## 2020-11-04 NOTE — Telephone Encounter (Signed)
Called patient back- he states that his right leg is bigger than his left leg. He states that it is his upper leg, thigh area - from his knee up. He denies redness, pain, tightness, not fluid related, swelling, weight gain. Patient denies having a leg injury and it does not improve with elevation, only mentions having a right wrist and arm injury. Advised I would check with MD on possible reason and recommendations. He also mentions his medications (lisinopril, and amlodipine) he states it was mentioned at one time he could do a combination medication instead of taking both. I advised I would send a message over to MD to advise.   Patient states that Dr.C can message him on mychart with his response if he would like, he does not have to have a call back.   Will route to MD/RN. Thanks!

## 2020-11-04 NOTE — Telephone Encounter (Signed)
Pt c/o swelling: STAT is pt has developed SOB within 24 hours  If swelling, where is the swelling located? Right leg is swollen really big  How much weight have you gained and in what time span?   ave you gained 3 pounds in a day or 5 pounds in a week?   Do you have a log of your daily weights (if so, list)? no  Are you currently taking a fluid pill?   Are you currently SOB? no  Have you traveled recently? To [place] last week- patient would like to be seen

## 2020-11-09 NOTE — Telephone Encounter (Signed)
Sent my chart message with information below.

## 2020-11-23 ENCOUNTER — Other Ambulatory Visit: Payer: Self-pay

## 2020-11-23 DIAGNOSIS — F411 Generalized anxiety disorder: Secondary | ICD-10-CM | POA: Diagnosis not present

## 2020-11-24 ENCOUNTER — Ambulatory Visit (INDEPENDENT_AMBULATORY_CARE_PROVIDER_SITE_OTHER): Payer: BC Managed Care – PPO | Admitting: Family Medicine

## 2020-11-24 VITALS — BP 122/76 | HR 88 | Temp 98.2°F | Ht 68.0 in | Wt 193.9 lb

## 2020-11-24 DIAGNOSIS — Z125 Encounter for screening for malignant neoplasm of prostate: Secondary | ICD-10-CM | POA: Diagnosis not present

## 2020-11-24 DIAGNOSIS — Z23 Encounter for immunization: Secondary | ICD-10-CM

## 2020-11-24 DIAGNOSIS — Z0000 Encounter for general adult medical examination without abnormal findings: Secondary | ICD-10-CM

## 2020-11-24 LAB — HEPATIC FUNCTION PANEL
ALT: 30 U/L (ref 0–53)
AST: 27 U/L (ref 0–37)
Albumin: 4.6 g/dL (ref 3.5–5.2)
Alkaline Phosphatase: 91 U/L (ref 39–117)
Bilirubin, Direct: 0.1 mg/dL (ref 0.0–0.3)
Total Bilirubin: 0.4 mg/dL (ref 0.2–1.2)
Total Protein: 7.4 g/dL (ref 6.0–8.3)

## 2020-11-24 LAB — CBC WITH DIFFERENTIAL/PLATELET
Basophils Absolute: 0.1 10*3/uL (ref 0.0–0.1)
Basophils Relative: 0.6 % (ref 0.0–3.0)
Eosinophils Absolute: 0.1 10*3/uL (ref 0.0–0.7)
Eosinophils Relative: 1.2 % (ref 0.0–5.0)
HCT: 43.7 % (ref 39.0–52.0)
Hemoglobin: 14.4 g/dL (ref 13.0–17.0)
Lymphocytes Relative: 6.6 % — ABNORMAL LOW (ref 12.0–46.0)
Lymphs Abs: 0.8 10*3/uL (ref 0.7–4.0)
MCHC: 33 g/dL (ref 30.0–36.0)
MCV: 87.8 fl (ref 78.0–100.0)
Monocytes Absolute: 0.6 10*3/uL (ref 0.1–1.0)
Monocytes Relative: 5 % (ref 3.0–12.0)
Neutro Abs: 10.1 10*3/uL — ABNORMAL HIGH (ref 1.4–7.7)
Neutrophils Relative %: 86.6 % — ABNORMAL HIGH (ref 43.0–77.0)
Platelets: 218 10*3/uL (ref 150.0–400.0)
RBC: 4.98 Mil/uL (ref 4.22–5.81)
RDW: 13.6 % (ref 11.5–15.5)
WBC: 11.7 10*3/uL — ABNORMAL HIGH (ref 4.0–10.5)

## 2020-11-24 LAB — PSA: PSA: 6.98 ng/mL — ABNORMAL HIGH (ref 0.10–4.00)

## 2020-11-24 LAB — BASIC METABOLIC PANEL
BUN: 20 mg/dL (ref 6–23)
CO2: 27 mEq/L (ref 19–32)
Calcium: 9.4 mg/dL (ref 8.4–10.5)
Chloride: 103 mEq/L (ref 96–112)
Creatinine, Ser: 0.8 mg/dL (ref 0.40–1.50)
GFR: 95.69 mL/min (ref >60.00)
Glucose, Bld: 85 mg/dL (ref 70–99)
Potassium: 4.1 mEq/L (ref 3.5–5.1)
Sodium: 139 mEq/L (ref 135–145)

## 2020-11-24 LAB — LIPID PANEL
Cholesterol: 146 mg/dL (ref 0–200)
HDL: 46.3 mg/dL (ref >39.00)
LDL Cholesterol: 73 mg/dL (ref 0–99)
NonHDL: 99.88
Total CHOL/HDL Ratio: 3
Triglycerides: 133 mg/dL (ref 0.0–149.0)
VLDL: 26.6 mg/dL (ref 0.0–40.0)

## 2020-11-24 LAB — HEMOGLOBIN A1C: Hgb A1c MFr Bld: 6.1 % (ref 4.6–6.5)

## 2020-11-24 LAB — TSH: TSH: 1.23 u[IU]/mL (ref 0.35–5.50)

## 2020-11-24 NOTE — Progress Notes (Signed)
a1C  Established Patient Office Visit  Subjective:  Patient ID: Damon [name], male    DOB: 03-05-1960  Age: 60 y.o. MRN: [id number]  CC: No chief complaint on file.   HPI [name] presents for complete physical exam.  He has history of CAD, hypertension, dyslipidemia.  His current medications include amlodipine, Zetia, lisinopril, high-dose atorvastatin, Vascepa.  Generally doing well.  He has gained some weight during the past couple years.  Staying very physical with work.  Not riding his Peloton much recently.  He is concerned because his dad was recently diagnosed with type 2 diabetes.  Kojo has had prediabetes range blood sugars in the past.  Health maintenance reviewed:  Health Maintenance  Topic Date Due   COVID-19 Vaccine (3 - Booster for Pfizer series) 10/14/2019   INFLUENZA VACCINE  09/21/2020   COLONOSCOPY (Pts 45-32yrs Insurance coverage will need to be confirmed)  11/27/2024   TETANUS/TDAP  11/21/2029   Hepatitis C Screening  Completed   HIV Screening  Completed   Zoster Vaccines- Shingrix  Completed   HPV VACCINES  Aged Out   Family history and social history reviewed with no significant changes-except for father recently diagnosed with type 2 diabetes.    Past Medical History:  Diagnosis Date   Allergy    seasonal    Coronary artery disease    Depression    situational    Heart murmur    hx of in childhood    Hyperlipidemia    Hypertension    Injury of tendon of long head of right biceps    January 2016    Past Surgical History:  Procedure Laterality Date   APPENDECTOMY     CARDIAC CATHETERIZATION     COLONOSCOPY N/A 11/28/2014   Procedure: COLONOSCOPY;  Surgeon: Louis Meckel, MD;  Location: [facility];  Service: Endoscopy;  Laterality: N/A;   colonscopy      removed polyps   CORONARY STENT INTERVENTION N/A 11/20/2017   Procedure: CORONARY STENT INTERVENTION;  Surgeon: Runell Gess, MD;  Location: [facility] INVAS[facility];  Service:  Cardiovascular;  Laterality: N/A;   INGUINAL HERNIA REPAIR Left 06/12/2014   Procedure: LEFT INGUINAL HERNIA REPAIR WITH MESH;  Surgeon: Darnell Level, MD;  Location: [facility] ORS;  Service: General;  Laterality: Left;   INSERTION OF MESH Left 06/12/2014   Procedure: INSERTION OF MESH;  Surgeon: Darnell Level, MD;  Location: [facility] OR[facility];  Service: General;  Laterality: Left;   LEFT HEART CATH AND CORONARY ANGIOGRAPHY N/A 11/20/2017   Procedure: LEFT HEART CATH AND CORONARY ANGIOGRAPHY;  Surgeon: Runell Gess, MD;  Location: [facility];  Service: Cardiovascular;  Laterality: N/A;   MOUTH SURGERY     wisdom teeth removed;root canal - 03/2014   TONSILLECTOMY  1995   TONSILLECTOMY      Family History  Problem Relation Age of Onset   Depression Other    Hyperlipidemia Other    Hypertension Other    Cancer Other        prostate   Stroke Other    Heart disease Other    Cancer Paternal Grandfather    Heart disease Paternal Grandfather    Alzheimer's disease Mother    AAA (abdominal aortic aneurysm) Mother     Social History   Socioeconomic History   Marital status: Married    Spouse name: Not on file   Number of children: Not on file   Years of education: Not on file  Highest education level: Professional school degree (e.g., MD, DDS, DVM, JD)  Occupational History   Not on file  Tobacco Use   Smoking status: Former    Packs/day: 1.50    Years: 23.00    Pack years: 34.50    Types: Cigarettes    Quit date: 02/22/1995    Years since quitting: 25.7   Smokeless tobacco: Never  Vaping Use   Vaping Use: Never used  Substance and Sexual Activity   Alcohol use: Yes    Comment: occas   Drug use: No   Sexual activity: Not on file  Other Topics Concern   Not on file  Social History Narrative   Not on file   Social Determinants of Health   Financial Resource Strain: Not on file  Food Insecurity: Not on file  Transportation Needs: Not on file  Physical Activity: Not on file   Stress: Not on file  Social Connections: Not on file  Intimate Partner Violence: Not on file    Outpatient Medications Prior to Visit  Medication Sig Dispense Refill   amLODipine (NORVASC) 5 MG tablet Take 1 tablet (5 mg total) by mouth daily. 90 tablet 3   aspirin EC 81 MG EC tablet Take 1 tablet (81 mg total) by mouth daily.     atorvastatin (LIPITOR) 80 MG tablet TAKE 1 TABLET ONCE DAILY AT 6PM FOR CHOLESTEROL 90 tablet 3   cetirizine (ZYRTEC) 10 MG tablet Take 10 mg by mouth 2 (two) times daily as needed.      clonazePAM (KLONOPIN) 0.5 MG tablet Take 0.5-1 mg by mouth See admin instructions. Take 2 tablets in the morning and 1 tablet in the evening. May take 1 additional tablet as needed for anxiety     diphenoxylate-atropine (LOMOTIL) 2.5-0.025 MG tablet Take one to two tablets by mouth every 6 hours as needed for severe diarrhea. 30 tablet 0   ezetimibe (ZETIA) 10 MG tablet Take 1 tablet (10 mg total) by mouth daily. 90 tablet 3   lisinopril (ZESTRIL) 10 MG tablet Take 1 tablet (10 mg total) by mouth daily. 90 tablet 3   nitroGLYCERIN (NITRO-DUR) 0.2 mg/hr patch Cut patch into one - fourth pieces Place a one fourth piece of patch on  skin over affected area, changing to a new piece every 24 hours. 30 patch 1   nitroGLYCERIN (NITROSTAT) 0.4 MG SL tablet Place 1 tablet (0.4 mg total) under the tongue every 5 (five) minutes x 3 doses as needed for chest pain. 25 tablet 1   tamsulosin (FLOMAX) 0.4 MG CAPS capsule Take 1 capsule (0.4 mg total) by mouth daily. 90 capsule 3   VASCEPA 1 g capsule Take 2 capsules (2 g total) by mouth 2 (two) times daily. 120 capsule 11   No facility-administered medications prior to visit.    Allergies  Allergen Reactions   Poison Ivy Extract [Poison Ivy Extract] Itching and Rash   Poison Oak Extract [Poison Oak Extract] Itching and Rash    ROS Review of Systems  Constitutional:  Negative for activity change, appetite change, fatigue and fever.  HENT:   Negative for congestion, ear pain and trouble swallowing.   Eyes:  Negative for pain and visual disturbance.  Respiratory:  Negative for cough, shortness of breath and wheezing.   Cardiovascular:  Negative for chest pain and palpitations.  Gastrointestinal:  Negative for abdominal distention, abdominal pain, blood in stool, constipation, diarrhea, nausea, rectal pain and vomiting.  Endocrine: Negative for polydipsia and polyuria.  Genitourinary:  Negative for dysuria, hematuria and testicular pain.  Musculoskeletal:  Negative for arthralgias and joint swelling.  Skin:  Negative for rash.  Neurological:  Negative for dizziness, syncope and headaches.  Hematological:  Negative for adenopathy.  Psychiatric/Behavioral:  Negative for confusion and dysphoric mood.      Objective:    Physical Exam Constitutional:      General: He is not in acute distress.    Appearance: He is well-developed.  HENT:     Head: Normocephalic and atraumatic.     Right Ear: External ear normal.     Left Ear: External ear normal.  Eyes:     Conjunctiva/sclera: Conjunctivae normal.     Pupils: Pupils are equal, round, and reactive to light.  Neck:     Thyroid: No thyromegaly.  Cardiovascular:     Rate and Rhythm: Normal rate and regular rhythm.     Heart sounds: Normal heart sounds. No murmur heard. Pulmonary:     Effort: No respiratory distress.     Breath sounds: No wheezing or rales.  Abdominal:     General: Bowel sounds are normal. There is no distension.     Palpations: Abdomen is soft. There is no mass.     Tenderness: There is no abdominal tenderness. There is no guarding or rebound.  Musculoskeletal:     Cervical back: Normal range of motion and neck supple.     Right lower leg: No edema.     Left lower leg: No edema.  Lymphadenopathy:     Cervical: No cervical adenopathy.  Skin:    Findings: No rash.  Neurological:     Mental Status: He is alert and oriented to person, place, and time.      Cranial Nerves: No cranial nerve deficit.    There were no vitals taken for this visit. Wt Readings from Last 3 Encounters:  05/29/20 181 lb (82.1 kg)  03/04/20 181 lb (82.1 kg)  11/22/19 180 lb 9.6 oz (81.9 kg)     Health Maintenance Due  Topic Date Due   COVID-19 Vaccine (3 - Booster for Pfizer series) 10/14/2019   INFLUENZA VACCINE  09/21/2020    There are no preventive care reminders to display for this patient.  Lab Results  Component Value Date   TSH 1.02 11/22/2019   Lab Results  Component Value Date   WBC 12.3 (H) 11/22/2019   HGB 13.2 11/22/2019   HCT 40.1 11/22/2019   MCV 86.1 11/22/2019   PLT 258 11/22/2019   Lab Results  Component Value Date   NA 136 11/22/2019   K 4.5 11/22/2019   CO2 28 11/22/2019   GLUCOSE 98 11/22/2019   BUN 21 11/22/2019   CREATININE 0.73 11/22/2019   BILITOT 0.2 11/22/2019   ALKPHOS 91 11/14/2018   AST 16 11/22/2019   ALT 19 11/22/2019   PROT 6.6 11/22/2019   ALBUMIN 5.1 11/14/2018   CALCIUM 9.3 11/22/2019   ANIONGAP 5 11/21/2017   GFR 81.08 11/14/2018   Lab Results  Component Value Date   CHOL 89 11/22/2019   Lab Results  Component Value Date   HDL 36 (L) 11/22/2019   Lab Results  Component Value Date   LDLCALC 35 11/22/2019   Lab Results  Component Value Date   TRIG 101 11/22/2019   Lab Results  Component Value Date   CHOLHDL 2.5 11/22/2019   No results found for: HGBA1C    Assessment & Plan:   Physical exam.  Patient has  history of CAD, hypertension, hyperlipidemia, prediabetes.  We discussed the following health maintenance issues  -Obtain screening labs including A1c.  He actually has appointment to see urologist next week and we will go ahead and get PSA as well.  -Flu vaccine given  -Has already had prior Pneumovax.  Would get Prevnar 20 at age 53  -Shingles vaccine already given  -Colonoscopy up-to-date  -Establish more consistent exercise and try to lose some weight.   No orders of the  defined types were placed in this encounter.   Follow-up: No follow-ups on file.    Evelena Peat, MD

## 2020-12-03 DIAGNOSIS — R972 Elevated prostate specific antigen [PSA]: Secondary | ICD-10-CM | POA: Diagnosis not present

## 2020-12-03 DIAGNOSIS — N138 Other obstructive and reflux uropathy: Secondary | ICD-10-CM | POA: Diagnosis not present

## 2020-12-03 DIAGNOSIS — N401 Enlarged prostate with lower urinary tract symptoms: Secondary | ICD-10-CM | POA: Diagnosis not present

## 2020-12-14 DIAGNOSIS — F411 Generalized anxiety disorder: Secondary | ICD-10-CM | POA: Diagnosis not present

## 2020-12-22 DIAGNOSIS — F411 Generalized anxiety disorder: Secondary | ICD-10-CM | POA: Diagnosis not present

## 2021-01-04 DIAGNOSIS — F900 Attention-deficit hyperactivity disorder, predominantly inattentive type: Secondary | ICD-10-CM | POA: Diagnosis not present

## 2021-01-18 ENCOUNTER — Telehealth: Payer: Self-pay | Admitting: Cardiovascular Disease

## 2021-01-18 DIAGNOSIS — F411 Generalized anxiety disorder: Secondary | ICD-10-CM | POA: Diagnosis not present

## 2021-01-18 NOTE — Telephone Encounter (Signed)
Spoke with the patient. Appointment changed to 12/1 per the patient request

## 2021-01-18 NOTE — Telephone Encounter (Signed)
Patient wanted to have his appt tomorrow as a telemedicine visit. He was not sure if that is an option for him or not. Please advise

## 2021-01-19 ENCOUNTER — Ambulatory Visit: Payer: BC Managed Care – PPO | Admitting: Cardiovascular Disease

## 2021-01-21 ENCOUNTER — Other Ambulatory Visit: Payer: Self-pay

## 2021-01-21 ENCOUNTER — Ambulatory Visit: Payer: BC Managed Care – PPO | Admitting: Cardiovascular Disease

## 2021-01-21 VITALS — BP 111/74 | Ht 68.0 in | Wt 180.0 lb

## 2021-01-21 DIAGNOSIS — E785 Hyperlipidemia, unspecified: Secondary | ICD-10-CM | POA: Diagnosis not present

## 2021-01-21 DIAGNOSIS — I7300 Raynaud's syndrome without gangrene: Secondary | ICD-10-CM

## 2021-01-21 DIAGNOSIS — I2510 Atherosclerotic heart disease of native coronary artery without angina pectoris: Secondary | ICD-10-CM

## 2021-01-21 DIAGNOSIS — I10 Essential (primary) hypertension: Secondary | ICD-10-CM | POA: Diagnosis not present

## 2021-01-21 DIAGNOSIS — R7303 Prediabetes: Secondary | ICD-10-CM

## 2021-01-21 NOTE — Progress Notes (Signed)
Cardiology Office Note:    Date:  01/23/2021   ID:  Damon [name], DOB 08-Jul-1960, MRN [id number]  PCP:  Kristian Covey, MD  Cardiologist:  Thurmon Fair, MD  Electrophysiologist:  None   Referring MD: Kristian Covey, MD   Chief Complaint  Patient presents with   Coronary Artery Disease     History of Present Illness:    Damon [name] is a 60 y.o. male with a hx of acute non-STEMI leading to placement of a drug-eluting stent in the ramus intermedius artery November 20, 2017, hyperlipidemia, hypertension, Raynaud's syndrome.  His wife accompanies him today.  He has done well from a cardiac point of view, but unfortunately has gained weight and this reflects unfavorably in his metabolic parameters.  His hemoglobin A1c is up to 6.1%.  His LDL cholesterol is 73 (up from 35 last year), and not meeting the most recent criteria for treatment in the setting of known CAD.  He is on maximum dose atorvastatin and takes ezetimibe 10 mg daily as well.  He has been less physically active.  No longer riding his Peloton bike.  The patient specifically denies any chest pain at rest exertion, dyspnea at rest or with exertion, orthopnea, paroxysmal nocturnal dyspnea, syncope, palpitations, focal neurological deficits, intermittent claudication, lower extremity edema, unexplained weight gain, cough, hemoptysis or wheezing.  He has not had any recent symptoms of Raynaud's syndrome.  He has had problems with prostatitis and when he took Cipro he developed numbness in his fingers and toes.  He switched to a different antibiotic and the symptoms resolved.  Echo at presentation in September 2019 showed LVEF 45-50% with lateral wall motion abnormalities.  Follow-up echo on December 5 shows resolution of the regional abnormalities with an EF of 50-55%.   Past Medical History:  Diagnosis Date   Allergy    seasonal    Coronary artery disease    Depression    situational    Heart murmur    hx of in  childhood    Hyperlipidemia    Hypertension    Injury of tendon of long head of right biceps    January 2016    Past Surgical History:  Procedure Laterality Date   APPENDECTOMY     CARDIAC CATHETERIZATION     COLONOSCOPY N/A 11/28/2014   Procedure: COLONOSCOPY;  Surgeon: Louis Meckel, MD;  Location: [facility];  Service: Endoscopy;  Laterality: N/A;   colonscopy      removed polyps   CORONARY STENT INTERVENTION N/A 11/20/2017   Procedure: CORONARY STENT INTERVENTION;  Surgeon: Runell Gess, MD;  Location: [facility];  Service: Cardiovascular;  Laterality: N/A;   INGUINAL HERNIA REPAIR Left 06/12/2014   Procedure: LEFT INGUINAL HERNIA REPAIR WITH MESH;  Surgeon: Darnell Level, MD;  Location: [facility];  Service: General;  Laterality: Left;   INSERTION OF MESH Left 06/12/2014   Procedure: INSERTION OF MESH;  Surgeon: Darnell Level, MD;  Location: [facility];  Service: General;  Laterality: Left;   LEFT HEART CATH AND CORONARY ANGIOGRAPHY N/A 11/20/2017   Procedure: LEFT HEART CATH AND CORONARY ANGIOGRAPHY;  Surgeon: Runell Gess, MD;  Location: [facility];  Service: Cardiovascular;  Laterality: N/A;   MOUTH SURGERY     wisdom teeth removed;root canal - 03/2014   TONSILLECTOMY  1995   TONSILLECTOMY      Current Medications: Current Meds  Medication Sig   amLODipine (NORVASC) 5 MG tablet Take 1  tablet (5 mg total) by mouth daily.   aspirin EC 81 MG EC tablet Take 1 tablet (81 mg total) by mouth daily.   atorvastatin (LIPITOR) 80 MG tablet TAKE 1 TABLET ONCE DAILY AT 6PM FOR CHOLESTEROL   cetirizine (ZYRTEC) 10 MG tablet Take 10 mg by mouth 2 (two) times daily as needed.    clonazePAM (KLONOPIN) 0.5 MG tablet Take 0.5-1 mg by mouth See admin instructions. Take 2 tablets in the morning and 1 tablet in the evening. May take 1 additional tablet as needed for anxiety   ezetimibe (ZETIA) 10 MG tablet Take 1 tablet (10 mg total) by mouth daily.   lisdexamfetamine (VYVANSE) 30  MG capsule Take 30 mg by mouth daily in the afternoon.   lisinopril (ZESTRIL) 20 MG tablet Take 20 mg by mouth daily.   lisinopril (ZESTRIL) 20 MG tablet Take 1 tablet by mouth daily.     Allergies:   Hornet venom, Wasp venom, Yellow jacket venom, Poison ivy extract [poison ivy extract], and Poison oak extract [poison oak extract]   Social History   Socioeconomic History   Marital status: Married    Spouse name: Not on file   Number of children: Not on file   Years of education: Not on file   Highest education level: Professional school degree (e.g., MD, DDS, DVM, JD)  Occupational History   Not on file  Tobacco Use   Smoking status: Former    Packs/day: 1.50    Years: 23.00    Pack years: 34.50    Types: Cigarettes    Quit date: 02/22/1995    Years since quitting: 25.9   Smokeless tobacco: Never  Vaping Use   Vaping Use: Never used  Substance and Sexual Activity   Alcohol use: Yes    Comment: occas   Drug use: No   Sexual activity: Not on file  Other Topics Concern   Not on file  Social History Narrative   Not on file   Social Determinants of Health   Financial Resource Strain: Not on file  Food Insecurity: Not on file  Transportation Needs: Not on file  Physical Activity: Not on file  Stress: Not on file  Social Connections: Not on file     Family History: The patient's family history includes AAA (abdominal aortic aneurysm) in his mother; Alzheimer's disease in his mother; Cancer in his paternal grandfather and another family member; Depression in an other family member; Heart disease in his paternal grandfather and another family member; Hyperlipidemia in an other family member; Hypertension in an other family member; Stroke in an other family member.  ROS:   Please see the history of present illness.   All other systems are reviewed and are negative.   EKGs/Labs/Other Studies Reviewed:    The following studies were reviewed today: Cardiac catheterization  11/20/2017 Ost Ramus to Ramus lesion is 99% stenosed. A drug-eluting stent was successfully placed. Post intervention, there is a 0% residual stenosis. There is mild left ventricular systolic dysfunction. LV end diastolic pressure is moderately elevated. The left ventricular ejection fraction is 45-50% by visual estimate.   Diagnostic Dominance: Co-dominant Intervention  Implants     Permanent Stent  Stent Synergy Des 2.25x16    Echocardiogram 01/25/2018 - Left ventricle: The cavity size was normal. Systolic function was    normal. The estimated ejection fraction was in the range of 50%    to 55%. Wall motion was normal; there were no regional wall  motion abnormalities. The study is indeterminate for the    evaluation of LV diastolic function.  - Mitral valve: There was no significant regurgitation.  - Right ventricle: Systolic function was normal.  - Atrial septum: No defect or patent foramen ovale was identified.  - Tricuspid valve: There was no significant regurgitation.   EKG:  EKG is ordered today.  Shows normal sinus rhythm, incomplete right bundle branch block, borderline QTC 461 ms, no ischemic repolarization abnormalities Recent Labs: 11/24/2020: ALT 30; BUN 20; Creatinine, Ser 0.80; Hemoglobin 14.4; Platelets 218.0; Potassium 4.1; Sodium 139; TSH 1.23  Recent Lipid Panel    Component Value Date/Time   CHOL 146 11/24/2020 0944   CHOL 131 01/28/2019 1624   TRIG 133.0 11/24/2020 0944   HDL 46.30 11/24/2020 0944   HDL 40 01/28/2019 1624   CHOLHDL 3 11/24/2020 0944   VLDL 26.6 11/24/2020 0944   LDLCALC 73 11/24/2020 0944   LDLCALC 35 11/22/2019 1117   LDLDIRECT 93.0 12/09/2015 0820   11/24/2020 Cholesterol 146, HDL 46, LDL 73, triglycerides 133  Hemoglobin A1c 6.1%, hemoglobin 14.4, creatinine 0.8, potassium 4.1, ALT 30, TSH 1.23  Physical Exam:    VS:  BP 111/74   Ht  (1.727 m)   Wt 180 lb (81.6 kg)   BMI 27.37 kg/m     Wt Readings from Last 3  Encounters:  01/21/21 180 lb (81.6 kg)  11/24/20 193 lb 14.4 oz (88 kg)  05/29/20 181 lb (82.1 kg)     General: Alert, oriented x3, no distress, overweight Head: no evidence of trauma, PERRL, EOMI, no exophtalmos or lid lag, no myxedema, no xanthelasma; normal ears, nose and oropharynx Neck: normal jugular venous pulsations and no hepatojugular reflux; brisk carotid pulses without delay and no carotid bruits Chest: clear to auscultation, no signs of consolidation by percussion or palpation, normal fremitus, symmetrical and full respiratory excursions Cardiovascular: normal position and quality of the apical impulse, regular rhythm, normal first and second heart sounds, no murmurs, rubs or gallops Abdomen: no tenderness or distention, no masses by palpation, no abnormal pulsatility or arterial bruits, normal bowel sounds, no hepatosplenomegaly Extremities: no clubbing, cyanosis or edema; 2+ radial, ulnar and brachial pulses bilaterally; 2+ right femoral, posterior tibial and dorsalis pedis pulses; 2+ left femoral, posterior tibial and dorsalis pedis pulses; no subclavian or femoral bruits Neurological: grossly nonfocal Psych: Normal mood and affect    ASSESSMENT:    1. Coronary artery disease involving native coronary artery of native heart without angina pectoris   2. Dyslipidemia   3. Raynaud's disease without gangrene   4. Essential hypertension   5. Prediabetes     PLAN:    In order of problems listed above:  CAD: Asymptomatic, roughly 3 years status post presentation with non-STEMI and placement of a drug-eluting stent to the ramus intermedius artery. On aspirin and statin.  Intolerant of beta-blockers due to Raynaud's syndrome.  He never had symptoms of heart failure, despite transient LV dysfunction due to stunning has almost completely returned to baseline.  On ACE inhibitor. HLP: LDL at target, HDL will not improve without substantial weight loss. Raynaud's syndrome: After  stopping his beta-blocker and adding low-dose amlodipine this has been controlled. HTN: Well-controlled. Overweight/prediabetes: Needs to restart exercise and reduce carbohydrates, especially sweets and starches with high glycemic index.    Medication Adjustments/Labs and Tests Ordered: Current medicines are reviewed at length with the patient today.  Concerns regarding medicines are outlined above.  Orders Placed This Encounter  Procedures   EKG 12-Lead    No orders of the defined types were placed in this encounter.   Patient Instructions  Medication Instructions:  No changes *If you need a refill on your cardiac medications before your next appointment, please call your pharmacy*   Lab Work: None ordered If you have labs (blood work) drawn today and your tests are completely normal, you will receive your results only by: MyChart Message (if you have MyChart) OR A paper copy in the mail If you have any lab test that is abnormal or we need to change your treatment, we will call you to review the results.   Testing/Procedures: None ordered   Follow-Up: At [facility], you and your health needs are our priority.  As part of our continuing mission to provide you with exceptional heart care, we have created designated Provider Care Teams.  These Care Teams include your primary Cardiologist (physician) and Advanced Practice Providers (APPs -  Physician Assistants and Nurse Practitioners) who all work together to provide you with the care you need, when you need it.  We recommend signing up for the patient portal called "MyChart".  Sign up information is provided on this After Visit Summary.  MyChart is used to connect with patients for Virtual Visits (Telemedicine).  Patients are able to view lab/test results, encounter notes, upcoming appointments, etc.  Non-urgent messages can be sent to your provider as well.   To learn more about what you can do with MyChart, go to  [email or web address].    Your next appointment:   12 month(s)  The format for your next appointment:   In Person  Provider:   Thurmon Fair, MD       Signed, Thurmon Fair, MD  01/23/2021 12:26 PM    [facility]

## 2021-01-21 NOTE — Patient Instructions (Signed)

## 2021-01-23 ENCOUNTER — Encounter: Payer: Self-pay | Admitting: Cardiovascular Disease

## 2021-01-27 DIAGNOSIS — N400 Enlarged prostate without lower urinary tract symptoms: Secondary | ICD-10-CM | POA: Diagnosis not present

## 2021-01-27 DIAGNOSIS — R972 Elevated prostate specific antigen [PSA]: Secondary | ICD-10-CM | POA: Diagnosis not present

## 2021-02-03 DIAGNOSIS — R399 Unspecified symptoms and signs involving the genitourinary system: Secondary | ICD-10-CM | POA: Diagnosis not present

## 2021-02-03 DIAGNOSIS — R972 Elevated prostate specific antigen [PSA]: Secondary | ICD-10-CM | POA: Diagnosis not present

## 2021-02-05 ENCOUNTER — Telehealth: Payer: Self-pay | Admitting: Cardiovascular Disease

## 2021-02-05 ENCOUNTER — Other Ambulatory Visit: Payer: Self-pay

## 2021-02-05 DIAGNOSIS — E785 Hyperlipidemia, unspecified: Secondary | ICD-10-CM

## 2021-02-05 DIAGNOSIS — I2510 Atherosclerotic heart disease of native coronary artery without angina pectoris: Secondary | ICD-10-CM

## 2021-02-05 DIAGNOSIS — N400 Enlarged prostate without lower urinary tract symptoms: Secondary | ICD-10-CM

## 2021-02-05 MED ORDER — ROSUVASTATIN CALCIUM 40 MG PO TABS: 40.0000 mg | ORAL_TABLET | Freq: Every day | ORAL | 3 refills | Status: DC

## 2021-02-05 MED ORDER — EZETIMIBE 10 MG PO TABS: 10.0000 mg | ORAL_TABLET | Freq: Every day | ORAL | 3 refills | Status: DC

## 2021-02-05 MED ORDER — AMLODIPINE BESYLATE 5 MG PO TABS: 5.0000 mg | ORAL_TABLET | Freq: Every day | ORAL | 3 refills | Status: DC

## 2021-02-05 MED ORDER — LISINOPRIL 20 MG PO TABS: 20.0000 mg | ORAL_TABLET | Freq: Every day | ORAL | 3 refills | Status: DC

## 2021-02-05 NOTE — Telephone Encounter (Signed)
° °  Pt c/o medication issue:  1. Name of Medication: lipitor, crestor  2. How are you currently taking this medication (dosage and times per day)?   3. Are you having a reaction (difficulty breathing--STAT)?   4. What is your medication issue? Pt is requesting to speak with Dr. Renaye Rakers nurse to discuss on switching meds from lipitor to crestor

## 2021-02-05 NOTE — Telephone Encounter (Signed)
Spoke to patient he stated he would like to switch Lipitor to Crestor.Advised I will send message to Dr.Croitoru for a order.

## 2021-02-05 NOTE — Telephone Encounter (Signed)
Spoke to patient Dr.Croitoru's advice given.Advised to stop Atorvastatin and start Crestor 40 mg daily.Repeat fasting lipid panel in 3 months.Lab order mailed.

## 2021-04-15 DIAGNOSIS — F900 Attention-deficit hyperactivity disorder, predominantly inattentive type: Secondary | ICD-10-CM | POA: Diagnosis not present

## 2021-05-13 ENCOUNTER — Other Ambulatory Visit: Payer: Self-pay

## 2021-05-13 ENCOUNTER — Ambulatory Visit: Payer: BC Managed Care – PPO | Admitting: Cardiovascular Disease

## 2021-05-13 DIAGNOSIS — E785 Hyperlipidemia, unspecified: Secondary | ICD-10-CM | POA: Diagnosis not present

## 2021-05-13 DIAGNOSIS — N400 Enlarged prostate without lower urinary tract symptoms: Secondary | ICD-10-CM | POA: Diagnosis not present

## 2021-05-13 DIAGNOSIS — N3000 Acute cystitis without hematuria: Secondary | ICD-10-CM | POA: Diagnosis not present

## 2021-05-13 DIAGNOSIS — I2510 Atherosclerotic heart disease of native coronary artery without angina pectoris: Secondary | ICD-10-CM | POA: Diagnosis not present

## 2021-05-14 DIAGNOSIS — N410 Acute prostatitis: Secondary | ICD-10-CM | POA: Diagnosis not present

## 2021-05-14 DIAGNOSIS — R82998 Other abnormal findings in urine: Secondary | ICD-10-CM | POA: Diagnosis not present

## 2021-05-14 DIAGNOSIS — R350 Frequency of micturition: Secondary | ICD-10-CM | POA: Diagnosis not present

## 2021-05-14 DIAGNOSIS — N5312 Painful ejaculation: Secondary | ICD-10-CM | POA: Diagnosis not present

## 2021-05-14 DIAGNOSIS — N3941 Urge incontinence: Secondary | ICD-10-CM | POA: Diagnosis not present

## 2021-05-14 LAB — LIPID PANEL
Chol/HDL Ratio: 2.3 ratio (ref 0.0–5.0)
Cholesterol, Total: 117 mg/dL (ref 100–199)
HDL: 50 mg/dL (ref >39)
LDL Chol Calc (NIH): 48 mg/dL (ref 0–99)
Triglycerides: 102 mg/dL (ref 0–149)
VLDL Cholesterol Cal: 19 mg/dL (ref 5–40)

## 2021-05-14 LAB — PSA: Prostate Specific Ag, Serum: 8.9 ng/mL — ABNORMAL HIGH (ref 0.0–4.0)

## 2021-06-23 ENCOUNTER — Ambulatory Visit (INDEPENDENT_AMBULATORY_CARE_PROVIDER_SITE_OTHER): Payer: BC Managed Care – PPO | Admitting: Family Medicine

## 2021-06-23 ENCOUNTER — Encounter: Payer: Self-pay | Admitting: Family Medicine

## 2021-06-23 VITALS — BP 110/80 | HR 78 | Temp 98.1°F | Wt 188.0 lb

## 2021-06-23 DIAGNOSIS — F411 Generalized anxiety disorder: Secondary | ICD-10-CM | POA: Diagnosis not present

## 2021-06-23 DIAGNOSIS — R0989 Other specified symptoms and signs involving the circulatory and respiratory systems: Secondary | ICD-10-CM

## 2021-06-23 DIAGNOSIS — J3089 Other allergic rhinitis: Secondary | ICD-10-CM | POA: Insufficient documentation

## 2021-06-23 LAB — POC COVID19 BINAXNOW: SARS Coronavirus 2 Ag: NEGATIVE

## 2021-06-23 MED ORDER — METHYLPREDNISOLONE ACETATE 80 MG/ML IJ SUSP
80.0000 mg | Freq: Once | INTRAMUSCULAR | Status: AC
  Administered 2021-06-23: 80 mg via INTRAMUSCULAR

## 2021-06-23 MED ORDER — METHYLPREDNISOLONE ACETATE 40 MG/ML IJ SUSP
40.0000 mg | Freq: Once | INTRAMUSCULAR | Status: AC
  Administered 2021-06-23: 40 mg via INTRAMUSCULAR

## 2021-06-23 NOTE — Progress Notes (Signed)
? ?  Subjective:  ? ? Patient ID: VALGENE DELOATCH, male    DOB: 02-02-1961, 61 y.o.   MRN: [id number] ? ?HPI ?Here for a flare of his allergies. For the past 4 days he has had sinus pressure, PND, and a dry cough. No fever or body aches. No SOB. Taking Allertec BID with minimal relief.  ? ? ?Review of Systems  ?Constitutional: Negative.   ?HENT:  Positive for congestion, postnasal drip and sneezing. Negative for ear pain, facial swelling and sore throat.   ?Eyes: Negative.   ?Respiratory:  Positive for cough.   ? ?   ?Objective:  ? Physical Exam ?Constitutional:   ?   Appearance: Normal appearance. He is not ill-appearing.  ?HENT:  ?   Right Ear: Tympanic membrane, ear canal and external ear normal.  ?   Left Ear: Tympanic membrane, ear canal and external ear normal.  ?   Nose: Nose normal.  ?   Mouth/Throat:  ?   Pharynx: Oropharynx is clear.  ?Eyes:  ?   Conjunctiva/sclera: Conjunctivae normal.  ?Pulmonary:  ?   Effort: Pulmonary effort is normal.  ?   Breath sounds: Normal breath sounds.  ?Lymphadenopathy:  ?   Cervical: No cervical adenopathy.  ?Neurological:  ?   Mental Status: He is alert.  ? ? ? ? ? ?   ?Assessment & Plan:  ?Allergies, he is given a shot of DepoMedrol. Recheck as needed. ?Gershon Crane, MD ? ? ?

## 2021-06-23 NOTE — Addendum Note (Signed)
Addended by: Carola Rhine on: 06/23/2021 05:21 PM ? ? Modules accepted: Orders ? ?

## 2021-07-08 DIAGNOSIS — N138 Other obstructive and reflux uropathy: Secondary | ICD-10-CM | POA: Diagnosis not present

## 2021-07-08 DIAGNOSIS — F900 Attention-deficit hyperactivity disorder, predominantly inattentive type: Secondary | ICD-10-CM | POA: Diagnosis not present

## 2021-07-08 DIAGNOSIS — R972 Elevated prostate specific antigen [PSA]: Secondary | ICD-10-CM | POA: Diagnosis not present

## 2021-07-08 DIAGNOSIS — N401 Enlarged prostate with lower urinary tract symptoms: Secondary | ICD-10-CM | POA: Diagnosis not present

## 2021-08-31 DIAGNOSIS — J3089 Other allergic rhinitis: Secondary | ICD-10-CM | POA: Diagnosis not present

## 2021-08-31 DIAGNOSIS — J3081 Allergic rhinitis due to animal (cat) (dog) hair and dander: Secondary | ICD-10-CM | POA: Diagnosis not present

## 2021-08-31 DIAGNOSIS — J301 Allergic rhinitis due to pollen: Secondary | ICD-10-CM | POA: Diagnosis not present

## 2021-09-27 DIAGNOSIS — L570 Actinic keratosis: Secondary | ICD-10-CM | POA: Diagnosis not present

## 2021-09-27 DIAGNOSIS — L821 Other seborrheic keratosis: Secondary | ICD-10-CM | POA: Diagnosis not present

## 2021-09-27 DIAGNOSIS — L820 Inflamed seborrheic keratosis: Secondary | ICD-10-CM | POA: Diagnosis not present

## 2021-09-27 DIAGNOSIS — D2271 Melanocytic nevi of right lower limb, including hip: Secondary | ICD-10-CM | POA: Diagnosis not present

## 2021-09-27 DIAGNOSIS — L812 Freckles: Secondary | ICD-10-CM | POA: Diagnosis not present

## 2021-09-27 DIAGNOSIS — D2272 Melanocytic nevi of left lower limb, including hip: Secondary | ICD-10-CM | POA: Diagnosis not present

## 2021-09-30 DIAGNOSIS — F900 Attention-deficit hyperactivity disorder, predominantly inattentive type: Secondary | ICD-10-CM | POA: Diagnosis not present

## 2021-10-01 DIAGNOSIS — F411 Generalized anxiety disorder: Secondary | ICD-10-CM | POA: Diagnosis not present

## 2021-11-29 ENCOUNTER — Encounter: Payer: Self-pay | Admitting: Family Medicine

## 2021-11-29 ENCOUNTER — Ambulatory Visit (INDEPENDENT_AMBULATORY_CARE_PROVIDER_SITE_OTHER): Payer: BC Managed Care – PPO | Admitting: Family Medicine

## 2021-11-29 VITALS — BP 120/78 | HR 94 | Temp 97.6°F | Ht 67.32 in | Wt 190.7 lb

## 2021-11-29 DIAGNOSIS — Z0000 Encounter for general adult medical examination without abnormal findings: Secondary | ICD-10-CM | POA: Diagnosis not present

## 2021-11-29 DIAGNOSIS — Z23 Encounter for immunization: Secondary | ICD-10-CM

## 2021-11-29 DIAGNOSIS — E875 Hyperkalemia: Secondary | ICD-10-CM

## 2021-11-29 DIAGNOSIS — Z125 Encounter for screening for malignant neoplasm of prostate: Secondary | ICD-10-CM | POA: Diagnosis not present

## 2021-11-29 LAB — BASIC METABOLIC PANEL
BUN: 19 mg/dL (ref 6–23)
CO2: 26 mEq/L (ref 19–32)
Calcium: 10.1 mg/dL (ref 8.4–10.5)
Chloride: 104 mEq/L (ref 96–112)
Creatinine, Ser: 0.89 mg/dL (ref 0.40–1.50)
GFR: 92 mL/min (ref >60.00)
Glucose, Bld: 101 mg/dL — ABNORMAL HIGH (ref 70–99)
Potassium: 5.6 mEq/L — ABNORMAL HIGH (ref 3.5–5.1)
Sodium: 140 mEq/L (ref 135–145)

## 2021-11-29 LAB — LIPID PANEL
Cholesterol: 117 mg/dL (ref 0–200)
HDL: 42 mg/dL (ref >39.00)
LDL Cholesterol: 56 mg/dL (ref 0–99)
NonHDL: 75.2
Total CHOL/HDL Ratio: 3
Triglycerides: 98 mg/dL (ref 0.0–149.0)
VLDL: 19.6 mg/dL (ref 0.0–40.0)

## 2021-11-29 LAB — CBC WITH DIFFERENTIAL/PLATELET
Basophils Absolute: 0 10*3/uL (ref 0.0–0.1)
Basophils Relative: 0.5 % (ref 0.0–3.0)
Eosinophils Absolute: 0.1 10*3/uL (ref 0.0–0.7)
Eosinophils Relative: 1.2 % (ref 0.0–5.0)
HCT: 45.9 % (ref 39.0–52.0)
Hemoglobin: 15.3 g/dL (ref 13.0–17.0)
Lymphocytes Relative: 22.9 % (ref 12.0–46.0)
Lymphs Abs: 2 10*3/uL (ref 0.7–4.0)
MCHC: 33.4 g/dL (ref 30.0–36.0)
MCV: 86 fl (ref 78.0–100.0)
Monocytes Absolute: 0.6 10*3/uL (ref 0.1–1.0)
Monocytes Relative: 7.2 % (ref 3.0–12.0)
Neutro Abs: 6.1 10*3/uL (ref 1.4–7.7)
Neutrophils Relative %: 68.2 % (ref 43.0–77.0)
Platelets: 236 10*3/uL (ref 150.0–400.0)
RBC: 5.34 Mil/uL (ref 4.22–5.81)
RDW: 12.7 % (ref 11.5–15.5)
WBC: 8.9 10*3/uL (ref 4.0–10.5)

## 2021-11-29 LAB — HEPATIC FUNCTION PANEL
ALT: 36 U/L (ref 0–53)
AST: 27 U/L (ref 0–37)
Albumin: 4.8 g/dL (ref 3.5–5.2)
Alkaline Phosphatase: 79 U/L (ref 39–117)
Bilirubin, Direct: 0.1 mg/dL (ref 0.0–0.3)
Total Bilirubin: 0.3 mg/dL (ref 0.2–1.2)
Total Protein: 8.1 g/dL (ref 6.0–8.3)

## 2021-11-29 LAB — PSA: PSA: 6.47 ng/mL — ABNORMAL HIGH (ref 0.10–4.00)

## 2021-11-29 LAB — TSH: TSH: 1.23 u[IU]/mL (ref 0.35–5.50)

## 2021-11-29 LAB — HEMOGLOBIN A1C: Hgb A1c MFr Bld: 6.2 % (ref 4.6–6.5)

## 2021-11-29 NOTE — Progress Notes (Signed)
Established Patient Office Visit  Subjective   Patient ID: Damon [name], male    DOB: October 28, 1960  Age: 61 y.o. MRN: [id number]  Chief Complaint  Patient presents with   Annual Exam    HPI   Here is here for annual physical exam.  He has history of CAD with prior non-ST elevation MI, hypertension, hyperlipidemia.  He also has history of elevated PSA which has been evaluated by urology.  Has had prostate biopsies in the past.  He is followed closely by cardiology.  He had coronary stent back in 2019 and no chest pain since then.  Health maintenance reviewed:  -Needs flu vaccine -Has had Shingrix -Hepatitis C vaccine completed -Tetanus due 2031 -Colonoscopy due 2026  Family history-father is alive age 82.  He apparently has history of prediabetes range blood sugars.  His mother is deceased.  She had history of Alzheimer's disease.  Social history-quit smoking 1997.  Past Medical History:  Diagnosis Date   Allergy    seasonal    Coronary artery disease    Depression    situational    Heart murmur    hx of in childhood    Hyperlipidemia    Hypertension    Injury of tendon of long head of right biceps    January 2016   Past Surgical History:  Procedure Laterality Date   APPENDECTOMY     CARDIAC CATHETERIZATION     COLONOSCOPY N/A 11/28/2014   Procedure: COLONOSCOPY;  Surgeon: Inda Castle, MD;  Location: [facility];  Service: Endoscopy;  Laterality: N/A;   colonscopy      removed polyps   CORONARY STENT INTERVENTION N/A 11/20/2017   Procedure: CORONARY STENT INTERVENTION;  Surgeon: Lorretta Harp, MD;  Location: [facility];  Service: Cardiovascular;  Laterality: N/A;   INGUINAL HERNIA REPAIR Left 06/12/2014   Procedure: LEFT INGUINAL HERNIA REPAIR WITH MESH;  Surgeon: Armandina Gemma, MD;  Location: [facility];  Service: General;  Laterality: Left;   INSERTION OF MESH Left 06/12/2014   Procedure: INSERTION OF MESH;  Surgeon: Armandina Gemma, MD;  Location: [facility];   Service: General;  Laterality: Left;   LEFT HEART CATH AND CORONARY ANGIOGRAPHY N/A 11/20/2017   Procedure: LEFT HEART CATH AND CORONARY ANGIOGRAPHY;  Surgeon: Lorretta Harp, MD;  Location: [facility];  Service: Cardiovascular;  Laterality: N/A;   MOUTH SURGERY     wisdom teeth removed;root canal - 03/2014   TONSILLECTOMY  1995   TONSILLECTOMY      reports that he quit smoking about 26 years ago. His smoking use included cigarettes. He has a 34.50 pack-year smoking history. He has never used smokeless tobacco. He reports current alcohol use. He reports that he does not use drugs. family history includes AAA (abdominal aortic aneurysm) in his mother; Alzheimer's disease in his mother; Cancer in his paternal grandfather and another family member; Depression in an other family member; Diabetes in his father; Heart disease in his paternal grandfather and another family member; Hyperlipidemia in an other family member; Hypertension in an other family member; Stroke in an other family member. Allergies  Allergen Reactions   Hornet Venom Anaphylaxis, Itching, Other (See Comments) and Swelling   Wasp Venom Anaphylaxis, Anxiety, Itching and Swelling   Yellow Jacket Venom Anaphylaxis, Anxiety, Other (See Comments) and Swelling   Poison Ivy Extract [Poison Ivy Extract] Itching and Rash   Poison Oak Extract [Poison Oak Extract] Itching and Rash    Review  of Systems  Constitutional:  Negative for chills, fever, malaise/fatigue and weight loss.  HENT:  Negative for hearing loss.   Eyes:  Negative for blurred vision and double vision.  Respiratory:  Negative for cough and shortness of breath.   Cardiovascular:  Negative for chest pain, palpitations and leg swelling.  Gastrointestinal:  Negative for abdominal pain, blood in stool, constipation and diarrhea.  Genitourinary:  Negative for dysuria.  Skin:  Negative for rash.  Neurological:  Negative for dizziness, speech change, seizures, loss of  consciousness and headaches.  Psychiatric/Behavioral:  Negative for depression.       Objective:     BP 120/78 (BP Location: Left Arm, Patient Position: Sitting, Cuff Size: Normal)   Pulse 94   Temp 97.6 F (36.4 C) (Oral)   Ht 5' 7.32" (1.71 m)   Wt 190 lb 11.2 oz (86.5 kg)   SpO2 99%   BMI 29.58 kg/m  BP Readings from Last 3 Encounters:  11/29/21 120/78  06/23/21 110/80  01/21/21 111/74   Wt Readings from Last 3 Encounters:  11/29/21 190 lb 11.2 oz (86.5 kg)  06/23/21 188 lb (85.3 kg)  01/21/21 180 lb (81.6 kg)      Physical Exam Vitals reviewed.  Constitutional:      General: He is not in acute distress.    Appearance: He is well-developed.  HENT:     Head: Normocephalic and atraumatic.     Right Ear: External ear normal.     Left Ear: External ear normal.     Mouth/Throat:     Mouth: Mucous membranes are moist.     Pharynx: Oropharynx is clear.  Eyes:     Conjunctiva/sclera: Conjunctivae normal.     Pupils: Pupils are equal, round, and reactive to light.  Neck:     Thyroid: No thyromegaly.  Cardiovascular:     Rate and Rhythm: Normal rate and regular rhythm.     Heart sounds: Normal heart sounds. No murmur heard. Pulmonary:     Effort: No respiratory distress.     Breath sounds: No wheezing or rales.  Abdominal:     General: Bowel sounds are normal. There is no distension.     Palpations: Abdomen is soft. There is no mass.     Tenderness: There is no abdominal tenderness. There is no guarding or rebound.  Musculoskeletal:     Cervical back: Normal range of motion and neck supple.     Right lower leg: No edema.     Left lower leg: No edema.  Lymphadenopathy:     Cervical: No cervical adenopathy.  Skin:    Findings: No rash.  Neurological:     Mental Status: He is alert and oriented to person, place, and time.     Cranial Nerves: No cranial nerve deficit.      No results found for any visits on 11/29/21.    The ASCVD Risk score (Arnett DK,  et al., 2019) failed to calculate for the following reasons:   The patient has a prior MI or stroke diagnosis    Assessment & Plan:   Problem List Items Addressed This Visit   None Visit Diagnoses     Physical exam    -  Primary   Relevant Orders   Basic metabolic panel   Lipid panel   CBC with Differential/Platelet   TSH   Hepatic function panel   Hemoglobin A1c   PSA   Need for influenza vaccination  Relevant Orders   Flu Vaccine QUAD 6+ mos PF IM (Fluarix Quad PF) (Completed)     Flu vaccine given  Recommend Prevnar 20 by age 3  Continue minimum 150 minutes/week of moderate aerobic exercise  Continue close follow-up with cardiology and neurology  No follow-ups on file.    Carolann Littler, MD

## 2021-11-30 NOTE — Addendum Note (Signed)
Addended by: Rosalee Kaufman L on: 11/30/2021 05:00 PM   Modules accepted: Orders

## 2021-12-07 ENCOUNTER — Other Ambulatory Visit: Payer: BC Managed Care – PPO

## 2021-12-09 ENCOUNTER — Other Ambulatory Visit: Payer: BC Managed Care – PPO

## 2021-12-09 DIAGNOSIS — E875 Hyperkalemia: Secondary | ICD-10-CM

## 2021-12-09 LAB — BASIC METABOLIC PANEL
BUN: 19 mg/dL (ref 6–23)
CO2: 28 mEq/L (ref 19–32)
Calcium: 9.4 mg/dL (ref 8.4–10.5)
Chloride: 103 mEq/L (ref 96–112)
Creatinine, Ser: 0.92 mg/dL (ref 0.40–1.50)
GFR: 89.28 mL/min (ref >60.00)
Glucose, Bld: 108 mg/dL — ABNORMAL HIGH (ref 70–99)
Potassium: 4.5 mEq/L (ref 3.5–5.1)
Sodium: 136 mEq/L (ref 135–145)

## 2021-12-28 DIAGNOSIS — F900 Attention-deficit hyperactivity disorder, predominantly inattentive type: Secondary | ICD-10-CM | POA: Diagnosis not present

## 2022-01-17 ENCOUNTER — Other Ambulatory Visit: Payer: Self-pay | Admitting: Cardiovascular Disease

## 2022-02-22 ENCOUNTER — Encounter: Payer: Self-pay | Admitting: Family Medicine

## 2022-02-22 ENCOUNTER — Ambulatory Visit (INDEPENDENT_AMBULATORY_CARE_PROVIDER_SITE_OTHER): Payer: BC Managed Care – PPO | Admitting: Family Medicine

## 2022-02-22 VITALS — BP 130/80 | HR 95 | Temp 97.6°F | Ht 67.32 in | Wt 196.2 lb

## 2022-02-22 DIAGNOSIS — R059 Cough, unspecified: Secondary | ICD-10-CM | POA: Diagnosis not present

## 2022-02-22 DIAGNOSIS — J101 Influenza due to other identified influenza virus with other respiratory manifestations: Secondary | ICD-10-CM

## 2022-02-22 LAB — POCT INFLUENZA A/B
Influenza A, POC: POSITIVE — AB
Influenza B, POC: NEGATIVE

## 2022-02-22 MED ORDER — IPRATROPIUM BROMIDE 0.06 % NA SOLN: 2.0000 | Freq: Four times a day (QID) | NASAL | 0 refills | Status: DC

## 2022-02-22 MED ORDER — OSELTAMIVIR PHOSPHATE 75 MG PO CAPS: 75.0000 mg | ORAL_CAPSULE | Freq: Two times a day (BID) | ORAL | 0 refills | Status: DC

## 2022-02-22 MED ORDER — BENZONATATE 100 MG PO CAPS: 100.0000 mg | ORAL_CAPSULE | Freq: Three times a day (TID) | ORAL | 0 refills | Status: DC | PRN

## 2022-02-22 NOTE — Progress Notes (Signed)
Established Patient Office Visit  Subjective   Patient ID: Damon [name], male    DOB: [birth year]  Age: 62 y.o. MRN: [id number]  Chief Complaint  Patient presents with   Cough    Patient complains of cough, Non productive, x1 week, Tried Affrin and Robitussin with little relief    Nasal Congestion    Patient complains of nasal congestion, x1 week, Tried Robitussin with little relief   Generalized Body Aches    Patient complains of body aches, x    HPI   Seen with onset slightly before Christmas of some nasal congestion symptoms.  He then had acute worsening yesterday of bodyaches and dry cough without fever.  No nausea or vomiting.  has taken Tylenol with some relief of body aches.  No known sick contacts.  No dyspnea.  No obvious wheezing.  No vomiting.  No diarrhea.  Past Medical History:  Diagnosis Date   Allergy    seasonal    Coronary artery disease    Depression    situational    Heart murmur    hx of in childhood    Hyperlipidemia    Hypertension    Injury of tendon of long head of right biceps    January 2016   Past Surgical History:  Procedure Laterality Date   APPENDECTOMY     CARDIAC CATHETERIZATION     COLONOSCOPY N/A 11/28/2014   Procedure: COLONOSCOPY;  Surgeon: Inda Castle, MD;  Location: [facility];  Service: Endoscopy;  Laterality: N/A;   colonscopy      removed polyps   CORONARY STENT INTERVENTION N/A 11/20/2017   Procedure: CORONARY STENT INTERVENTION;  Surgeon: Lorretta Harp, MD;  Location: [facility];  Service: Cardiovascular;  Laterality: N/A;   INGUINAL HERNIA REPAIR Left 06/12/2014   Procedure: LEFT INGUINAL HERNIA REPAIR WITH MESH;  Surgeon: Armandina Gemma, MD;  Location: [facility];  Service: General;  Laterality: Left;   INSERTION OF MESH Left 06/12/2014   Procedure: INSERTION OF MESH;  Surgeon: Armandina Gemma, MD;  Location: [facility];  Service: General;  Laterality: Left;   LEFT HEART CATH AND CORONARY ANGIOGRAPHY N/A 11/20/2017    Procedure: LEFT HEART CATH AND CORONARY ANGIOGRAPHY;  Surgeon: Lorretta Harp, MD;  Location: [facility];  Service: Cardiovascular;  Laterality: N/A;   MOUTH SURGERY     wisdom teeth removed;root canal - 03/2014   TONSILLECTOMY  1995   TONSILLECTOMY      reports that he quit smoking about 27 years ago. His smoking use included cigarettes. He has a 34.50 pack-year smoking history. He has never used smokeless tobacco. He reports current alcohol use. He reports that he does not use drugs. family history includes AAA (abdominal aortic aneurysm) in his mother; Alzheimer's disease in his mother; Cancer in his paternal grandfather and another family member; Depression in an other family member; Diabetes in his father; Heart disease in his paternal grandfather and another family member; Hyperlipidemia in an other family member; Hypertension in an other family member; Stroke in an other family member. Allergies  Allergen Reactions   Hornet Venom Anaphylaxis, Itching, Other (See Comments) and Swelling   Wasp Venom Anaphylaxis, Anxiety, Itching and Swelling   Yellow Jacket Venom Anaphylaxis, Anxiety, Other (See Comments) and Swelling   Poison Ivy Extract [Poison Ivy Extract] Itching and Rash   Poison Oak Extract [Poison Oak Extract] Itching and Rash    Review of Systems  Constitutional:  Negative for chills and  fever.  HENT:  Positive for congestion.   Respiratory:  Positive for cough.   Cardiovascular:  Negative for chest pain.      Objective:     BP 130/80 (BP Location: Left Arm, Patient Position: Sitting, Cuff Size: Normal)   Pulse 95   Temp 97.6 F (36.4 C) (Oral)   Ht 5' 7.32" (1.71 m)   Wt 196 lb 3.2 oz (89 kg)   SpO2 98%   BMI 30.44 kg/m  BP Readings from Last 3 Encounters:  02/22/22 130/80  11/29/21 120/78  06/23/21 110/80   Wt Readings from Last 3 Encounters:  02/22/22 196 lb 3.2 oz (89 kg)  11/29/21 190 lb 11.2 oz (86.5 kg)  06/23/21 188 lb (85.3 kg)       Physical Exam Vitals reviewed.  Constitutional:      General: He is not in acute distress.    Appearance: He is not ill-appearing.  HENT:     Right Ear: Tympanic membrane normal.     Left Ear: Tympanic membrane normal.  Cardiovascular:     Rate and Rhythm: Normal rate and regular rhythm.  Pulmonary:     Effort: Pulmonary effort is normal.     Breath sounds: Normal breath sounds.  Neurological:     Mental Status: He is alert.      No results found for any visits on 02/22/22.    The ASCVD Risk score (Arnett DK, et al., 2019) failed to calculate for the following reasons:   The patient has a prior MI or stroke diagnosis    Assessment & Plan:   Influenza A by rapid testing here today.  Patient describes acute onset of flulike symptoms as above yesterday.  -Start Tamiflu 75 mg twice daily for 5 days -Push fluids and plenty of rest -Tessalon Perles 100 mg every 8 hours as needed for cough -Atrovent nasal 0.06% 2 sprays per nostril 3-4 times daily as needed -Follow-up for any persistent or worsening symptoms.  Carolann Littler, MD

## 2022-03-01 ENCOUNTER — Telehealth: Payer: Self-pay | Admitting: Family Medicine

## 2022-03-01 NOTE — Telephone Encounter (Signed)
[employer] / [employer] -   Karlene Lineman D. called to say the following are not covered:  benzonatate (TESSALON PERLES) 100 MG capsule and the [place]  Pt really wants to start taking these meds.  Requesting to Initiate Non-formulary exemption.  LOV:  02/22/22  [id number] - [place], [place] Phone: [phone]  Fax: [phone]     Please advise.

## 2022-03-02 NOTE — Telephone Encounter (Signed)
Left message for the patient to return my call.

## 2022-03-02 NOTE — Telephone Encounter (Signed)
Left a message for the patient to return my call.  

## 2022-03-02 NOTE — Telephone Encounter (Signed)
Pt is returning mykal call 

## 2022-03-03 NOTE — Telephone Encounter (Signed)
Patient informed of the message below and verbalized understanding. Patient inquired if PCP can compose a letter in regards to Benzonatate 100 mg stating this medication was medically necessary for the patient in order to have medication possibly covered by his insurance. Patient reported he has already paid for the prescription and is currently taking medication.

## 2022-03-04 NOTE — Telephone Encounter (Signed)
Left a detailed message informing the patient that letter has been completed and sent via [place]

## 2022-03-09 ENCOUNTER — Other Ambulatory Visit: Payer: Self-pay | Admitting: Family Medicine

## 2022-03-10 DIAGNOSIS — F411 Generalized anxiety disorder: Secondary | ICD-10-CM | POA: Diagnosis not present

## 2022-03-18 ENCOUNTER — Ambulatory Visit: Payer: BC Managed Care – PPO | Attending: Cardiovascular Disease | Admitting: Cardiovascular Disease

## 2022-03-18 ENCOUNTER — Encounter: Payer: Self-pay | Admitting: Cardiovascular Disease

## 2022-03-18 VITALS — BP 130/80 | HR 82 | Ht 68.0 in | Wt 194.0 lb

## 2022-03-18 DIAGNOSIS — I2510 Atherosclerotic heart disease of native coronary artery without angina pectoris: Secondary | ICD-10-CM

## 2022-03-18 DIAGNOSIS — I7300 Raynaud's syndrome without gangrene: Secondary | ICD-10-CM | POA: Diagnosis not present

## 2022-03-18 DIAGNOSIS — E785 Hyperlipidemia, unspecified: Secondary | ICD-10-CM

## 2022-03-18 DIAGNOSIS — E119 Type 2 diabetes mellitus without complications: Secondary | ICD-10-CM | POA: Diagnosis not present

## 2022-03-18 DIAGNOSIS — I10 Essential (primary) hypertension: Secondary | ICD-10-CM

## 2022-03-18 MED ORDER — DAPAGLIFLOZIN PROPANEDIOL 10 MG PO TABS: 10.0000 mg | ORAL_TABLET | Freq: Every day | ORAL | 3 refills | Status: DC

## 2022-03-18 NOTE — Progress Notes (Unsigned)
Cardiology Office Note:    Date:  03/19/2022   ID:  Damon [name], DOB 10-27-1960, MRN [id number]  PCP:  Kristian Covey, MD  Cardiologist:  Thurmon Fair, MD  Electrophysiologist:  None   Referring MD: Kristian Covey, MD   Chief Complaint  Patient presents with   Coronary Artery Disease     History of Present Illness:    Damon [name] is a 62 y.o. male with a hx of acute non-STEMI leading to placement of a drug-eluting stent in the ramus intermedius artery November 20, 2017, hyperlipidemia, hypertension, Raynaud's syndrome.  His wife accompanies him today.  He has not had any symptoms of coronary disease since his last appointment.  His symptoms of Raynaud's syndrome are also well-controlled.  Unfortunately he has gained substantial weight and is less physically active.  BMI is up to 30.  This is reflected in worse metabolic parameters.  His hemoglobin A1c is up to 6.2% and his HDL is down to 42, although he still has a very good LDL cholesterol at 56 on treatment with high-dose rosuvastatin and ezetimibe.   The patient specifically denies any chest pain at rest exertion, dyspnea at rest or with exertion, orthopnea, paroxysmal nocturnal dyspnea, syncope, palpitations, focal neurological deficits, intermittent claudication, lower extremity edema, unexplained weight gain, cough, hemoptysis or wheezing.   He has not had recent problems with prostatitis or urinary tract infections.  Echo at presentation in September 2019 showed LVEF 45-50% with lateral wall motion abnormalities.  Follow-up echo on December 5 shows resolution of the regional abnormalities with an EF of 50-55%.   Past Medical History:  Diagnosis Date   Allergy    seasonal    Coronary artery disease    Depression    situational    Heart murmur    hx of in childhood    Hyperlipidemia    Hypertension    Injury of tendon of long head of right biceps    January 2016    Past Surgical History:  Procedure  Laterality Date   APPENDECTOMY     CARDIAC CATHETERIZATION     COLONOSCOPY N/A 11/28/2014   Procedure: COLONOSCOPY;  Surgeon: Louis Meckel, MD;  [facility];  Service: Endoscopy;  Laterality: N/A;   colonscopy      removed polyps   CORONARY STENT INTERVENTION N/A 11/20/2017   Procedure: CORONARY STENT INTERVENTION;  Surgeon: Runell Gess, MD;  [facility]: [facility];  Service: Cardiovascular;  Laterality: N/A;   INGUINAL HERNIA REPAIR Left 06/12/2014   Procedure: LEFT INGUINAL HERNIA REPAIR WITH MESH;  Surgeon: Darnell Level, MD;  Location: [facility];  Service: General;  Laterality: Left;   INSERTION OF MESH Left 06/12/2014   Procedure: INSERTION OF MESH;  Surgeon: Darnell Level, MD;  Location: [facility];  Service: General;  Laterality: Left;   LEFT HEART CATH AND CORONARY ANGIOGRAPHY N/A 11/20/2017   Procedure: LEFT HEART CATH AND CORONARY ANGIOGRAPHY;  Surgeon: Runell Gess, MD;  Location: [facility];  Service: Cardiovascular;  Laterality: N/A;   MOUTH SURGERY     wisdom teeth removed;root canal - 03/2014   TONSILLECTOMY  1995   TONSILLECTOMY      Current Medications: Current Meds  Medication Sig   amLODipine (NORVASC) 5 MG tablet Take 1 tablet (5 mg total) by mouth daily.   aspirin EC 81 MG EC tablet Take 1 tablet (81 mg total) by mouth daily.   cetirizine (ZYRTEC) 10 MG tablet Take  10 mg by mouth 2 (two) times daily as needed.    clonazePAM (KLONOPIN) 0.5 MG tablet Take 0.5-1 mg by mouth See admin instructions. Take 2 tablets in the morning and 1 tablet in the evening. May take 1 additional tablet as needed for anxiety   dapagliflozin propanediol (FARXIGA) 10 MG TABS tablet Take 1 tablet (10 mg total) by mouth daily before breakfast.   ezetimibe (ZETIA) 10 MG tablet Take 1 tablet (10 mg total) by mouth daily.   lisdexamfetamine (VYVANSE) 30 MG capsule Take 30 mg by mouth daily in the afternoon.   lisinopril (ZESTRIL) 20 MG tablet Take 1 tablet (20 mg total) by  mouth daily.   nitroGLYCERIN (NITROSTAT) 0.4 MG SL tablet Place 1 tablet (0.4 mg total) under the tongue every 5 (five) minutes x 3 doses as needed for chest pain.   rosuvastatin (CRESTOR) 40 MG tablet Take 1 tablet (40 mg total) by mouth daily.   tamsulosin (FLOMAX) 0.4 MG CAPS capsule Take 1 capsule (0.4 mg total) by mouth daily.     Allergies:   Hornet venom, Wasp venom, Yellow jacket venom, Poison ivy extract [poison ivy extract], and Poison oak extract [poison oak extract]   Social History   Socioeconomic History   Marital status: Married    Spouse name: Not on file   Number of children: Not on file   Years of education: Not on file   Highest education level: Professional school degree (e.g., MD, DDS, DVM, JD)  Occupational History   Not on file  Tobacco Use   Smoking status: Former    Packs/day: 1.50    Years: 23.00    Total pack years: 34.50    Types: Cigarettes    Quit date: 02/22/1995    Years since quitting: 27.0   Smokeless tobacco: Never  Vaping Use   Vaping Use: Never used  Substance and Sexual Activity   Alcohol use: Yes    Comment: occas   Drug use: No   Sexual activity: Not on file  Other Topics Concern   Not on file  Social History Narrative   Not on file   Social Determinants of Health   Financial Resource Strain: Low Risk  (02/08/2018)   Overall Financial Resource Strain (CARDIA)    Difficulty of Paying Living Expenses: Not hard at all  Food Insecurity: No Food Insecurity (02/08/2018)   Hunger Vital Sign    Worried About Running Out of Food in the Last Year: Never true    [place] in the Last Year: Never true  Transportation [name]: No Transportation [name] (02/08/2018)   PRAPARE - Hydrologist (Medical): No    Lack of Transportation (Non-Medical): No  Physical Activity: Insufficiently Active (02/08/2018)   Exercise Vital Sign    Days of Exercise per Week: 4 days    Minutes of Exercise per Session: 30 min   Stress: Stress Concern Present (02/08/2018)   [place]    Feeling of Stress : Rather much  Social Connections: Not on file     Family History: The patient's family history includes AAA (abdominal aortic aneurysm) in his mother; Alzheimer's disease in his mother; Cancer in his paternal grandfather and another family member; Depression in an other family member; Diabetes in his father; Heart disease in his paternal grandfather and another family member; Hyperlipidemia in an other family member; Hypertension in an other family member; Stroke in an other family  member.  ROS:   Please see the history of present illness.   All other systems are reviewed and are negative.   EKGs/Labs/Other Studies Reviewed:    The following studies were reviewed today: Cardiac catheterization 11/20/2017 Ost Ramus to Ramus lesion is 99% stenosed. A drug-eluting stent was successfully placed. Post intervention, there is a 0% residual stenosis. There is mild left ventricular systolic dysfunction. LV end diastolic pressure is moderately elevated. The left ventricular ejection fraction is 45-50% by visual estimate.   Diagnostic Dominance: Co-dominant Intervention  Implants     Permanent Stent  Stent Synergy Des 2.25x16    Echocardiogram 01/25/2018 - Left ventricle: The cavity size was normal. Systolic function was    normal. The estimated ejection fraction was in the range of 50%    to 55%. Wall motion was normal; there were no regional wall    motion abnormalities. The study is indeterminate for the    evaluation of LV diastolic function.  - Mitral valve: There was no significant regurgitation.  - Right ventricle: Systolic function was normal.  - Atrial septum: No defect or patent foramen ovale was identified.  - Tricuspid valve: There was no significant regurgitation.   EKG:  EKG is ordered today.  It is unchanged from previous  tracings, shows normal sinus rhythm and incomplete right bundle branch block with a borderline QTc of 453 ms but no ischemic repolarization abnormalities.   Recent Labs: 11/29/2021: ALT 36; Hemoglobin 15.3; Platelets 236.0; TSH 1.23 12/09/2021: BUN 19; Creatinine, Ser 0.92; Potassium 4.5; Sodium 136  Hemoglobin A1c 6.2% Recent Lipid Panel    Component Value Date/Time   CHOL 117 11/29/2021 1150   CHOL 117 05/13/2021 1114   TRIG 98.0 11/29/2021 1150   HDL 42.00 11/29/2021 1150   HDL 50 05/13/2021 1114   CHOLHDL 3 11/29/2021 1150   VLDL 19.6 11/29/2021 1150   LDLCALC 56 11/29/2021 1150   LDLCALC 48 05/13/2021 1114   LDLCALC 35 11/22/2019 1117   LDLDIRECT 93.0 12/09/2015 0820   11/24/2020 Cholesterol 146, HDL 46, LDL 73, triglycerides 133  Hemoglobin A1c 6.1%, hemoglobin 14.4, creatinine 0.8, potassium 4.1, ALT 30, TSH 1.23  Physical Exam:    VS:  BP 130/80   Pulse 82   Ht 5\' 8"  (1.727 m)   Wt 194 lb (88 kg)   SpO2 94%   BMI 29.50 kg/m     Wt Readings from Last 3 Encounters:  03/18/22 194 lb (88 kg)  02/22/22 196 lb 3.2 oz (89 kg)  11/29/21 190 lb 11.2 oz (86.5 kg)      General: Alert, oriented x3, no distress, mildly obese Head: no evidence of trauma, PERRL, EOMI, no exophtalmos or lid lag, no myxedema, no xanthelasma; normal ears, nose and oropharynx Neck: normal jugular venous pulsations and no hepatojugular reflux; brisk carotid pulses without delay and no carotid bruits Chest: clear to auscultation, no signs of consolidation by percussion or palpation, normal fremitus, symmetrical and full respiratory excursions Cardiovascular: normal position and quality of the apical impulse, regular rhythm, normal first and second heart sounds, no murmurs, rubs or gallops Abdomen: no tenderness or distention, no masses by palpation, no abnormal pulsatility or arterial bruits, normal bowel sounds, no hepatosplenomegaly Extremities: no clubbing, cyanosis or edema; 2+ radial, ulnar and  brachial pulses bilaterally; 2+ right femoral, posterior tibial and dorsalis pedis pulses; 2+ left femoral, posterior tibial and dorsalis pedis pulses; no subclavian or femoral bruits Neurological: grossly nonfocal Psych: Normal mood and affect  ASSESSMENT:    1. Coronary artery disease involving native coronary artery of native heart without angina pectoris   2. Dyslipidemia   3. Type 2 diabetes mellitus without complication, without long-term current use of insulin (HCC)   4. Raynaud's disease without gangrene   5. Essential hypertension      PLAN:    In order of problems listed above:  CAD: Asymptomatic, roughly 4 years status post presentation with non-STEMI and placement of a drug-eluting stent to the ramus intermedius artery. On aspirin and statin.  Intolerant of beta-blockers due to Raynaud's syndrome.   HLP: LDL is at target, but unfortunate due to additional weight gain his HDL is now lower than it was in the past. Raynaud's syndrome: Currently asymptomatic.  After stopping his beta-blocker and adding low-dose amlodipine this has been controlled. HTN: Well-controlled. Overweight/prediabetes: We have discussed the need to reduce the intake of carbohydrates, especially sugars and starches with high glycemic index and increase his physical activity to a minimum of 3 hours a week.  It is possible that is highly potent statin (rosuvastatin 40 mg daily) is contributing to his elevation in glucose levels, but on the other hand his weight gain is more likely the most important driver of this.  Discussed novel agents for diabetes that improved cardiovascular outcomes.  As he has frequent issues with GI upset , may not tolerate GLP-1 agonists as well.  Will try Farxiga 10 mg daily.  This should help with weight loss.  Discussed the potential side effects of SGLT2 inhibitors, including groin yeast infections and the rare but serious possibility of Fournier's gangrene.  Reevaluate labs in  several months.    Medication Adjustments/Labs and Tests Ordered: Current medicines are reviewed at length with the patient today.  Concerns regarding medicines are outlined above.  Orders Placed This Encounter  Procedures   Comprehensive metabolic panel   Hemoglobin A1c   Lipid panel   EKG 12-Lead    Meds ordered this encounter  Medications   dapagliflozin propanediol (FARXIGA) 10 MG TABS tablet    Sig: Take 1 tablet (10 mg total) by mouth daily before breakfast.    Dispense:  90 tablet    Refill:  3     Patient Instructions  Medication Instructions:  Wilder Glade 10mg  daily *If you need a refill on your cardiac medications before your next appointment, please call your pharmacy*   Lab Work: Fasting labs in 3 months: CMET, AIC, Lipid panel If you have labs (blood work) drawn today and your tests are completely normal, you will receive your results only by: MyChart Message (if you have MyChart) OR A paper copy in the mail If you have any lab test that is abnormal or we need to change your treatment, we will call you to review the results.  Follow-Up: At [facility], you and your health [name] are our priority.  As part of our continuing mission to provide you with exceptional heart care, we have created designated Provider Care Teams.  These Care Teams include your primary Cardiologist (physician) and Advanced Practice Providers (APPs -  Physician Assistants and Nurse Practitioners) who all work together to provide you with the care you need, when you need it.  We recommend signing up for the patient portal called "MyChart".  Sign up information is provided on this After Visit Summary.  MyChart is used to connect with patients for Virtual Visits (Telemedicine).  Patients are able to view lab/test results, encounter notes, upcoming appointments, etc.  Non-urgent messages can be sent to your provider as well.   To learn more about what you can do with MyChart, go to  [email or web address].    Your next appointment:   1 year(s)  Provider:   Sanda Klein, MD       Signed, Sanda Klein, MD  03/19/2022 4:49 PM    [place]

## 2022-03-18 NOTE — Patient Instructions (Signed)
Medication Instructions:  Wilder Glade 10mg  daily *If you need a refill on your cardiac medications before your next appointment, please call your pharmacy*   Lab Work: Fasting labs in 3 months: CMET, AIC, Lipid panel If you have labs (blood work) drawn today and your tests are completely normal, you will receive your results only by: [place] (if you have MyChart) OR A paper copy in the mail If you have any lab test that is abnormal or we need to change your treatment, we will call you to review the results.  Follow-Up: At [facility], you and your health needs are our priority.  As part of our continuing mission to provide you with exceptional heart care, we have created designated Provider Care Teams.  These Care Teams include your primary Cardiologist (physician) and Advanced Practice Providers (APPs -  Physician Assistants and Nurse Practitioners) who all work together to provide you with the care you need, when you need it.  We recommend signing up for the patient portal called "MyChart".  Sign up information is provided on this After Visit Summary.  MyChart is used to connect with patients for Virtual Visits (Telemedicine).  Patients are able to view lab/test results, encounter notes, upcoming appointments, etc.  Non-urgent messages can be sent to your provider as well.   To learn more about what you can do with MyChart, go to [email or web address].    Your next appointment:   1 year(s)  Provider:   Sanda Klein, MD

## 2022-03-19 ENCOUNTER — Encounter: Payer: Self-pay | Admitting: Cardiovascular Disease

## 2022-03-19 DIAGNOSIS — E119 Type 2 diabetes mellitus without complications: Secondary | ICD-10-CM | POA: Insufficient documentation

## 2022-03-20 ENCOUNTER — Encounter: Payer: Self-pay | Admitting: Cardiovascular Disease

## 2022-03-21 ENCOUNTER — Telehealth: Payer: Self-pay

## 2022-03-21 ENCOUNTER — Telehealth: Payer: Self-pay | Admitting: Cardiovascular Disease

## 2022-03-21 NOTE — Telephone Encounter (Signed)
**Note De-Identified Shilynn Hoch Obfuscation** Wilder Glade PA started throufgh covermymeds. Key: [id number]

## 2022-03-21 NOTE — Telephone Encounter (Signed)
New Message:      Patient said when he saw Dr C, they had discussed Ozempic. He said they thought it was going to be hard to get. He wanted Dr C to know that he found 2 pharmacies here, that carries Ozempic.

## 2022-03-21 NOTE — Telephone Encounter (Signed)
[facility] stated [facility] can provide ozempic 0.25 and 0.5 and [facility]. He stated he gained weight and wants help losing weight. Mediterranean diet sent per mychart.

## 2022-03-21 NOTE — Telephone Encounter (Signed)
**Note De-Identified [name] Obfuscation** ----- **Note De-Identified [name] Obfuscation** Message from Sharee Holster, RN sent at 03/18/2022  2:31 PM EST ----- Regarding: Prior auth for [place] Need prior authorization for Farxiga.  Thank you

## 2022-03-29 DIAGNOSIS — F900 Attention-deficit hyperactivity disorder, predominantly inattentive type: Secondary | ICD-10-CM | POA: Diagnosis not present

## 2022-04-29 ENCOUNTER — Other Ambulatory Visit: Payer: Self-pay | Admitting: Cardiovascular Disease

## 2022-05-06 ENCOUNTER — Telehealth: Payer: Self-pay | Admitting: Cardiovascular Disease

## 2022-05-06 NOTE — Telephone Encounter (Signed)
Returned call to patient.  Explained that our best information states cards will be working again on Monday.  He will reach out to the pharmacy then

## 2022-05-06 NOTE — Telephone Encounter (Signed)
Pt c/o medication issue:  1. Name of Medication: dapagliflozin propanediol (FARXIGA) 10 MG TABS tablet   2. How are you currently taking this medication (dosage and times per day)? Take 1 tablet (10 mg total) by mouth daily before breakfast.   3. Are you having a reaction (difficulty breathing--STAT)? No  4. What is your medication issue? Heather from [facility] called stating that the pt has a deductible of $280 and they had a discount card of $200 but the system that takes the discount is currently down. The pt can't afford the medication without the discount, so Nira Conn wanted to know if there is an alternate version of this medication that would be cheaper that can be prescribed to the pt. Nira Conn stated you can call her if needed at [phone].

## 2022-05-11 ENCOUNTER — Other Ambulatory Visit: Payer: Self-pay | Admitting: Cardiovascular Disease

## 2022-05-31 ENCOUNTER — Telehealth: Payer: Self-pay | Admitting: Cardiovascular Disease

## 2022-05-31 DIAGNOSIS — E119 Type 2 diabetes mellitus without complications: Secondary | ICD-10-CM

## 2022-05-31 NOTE — Telephone Encounter (Signed)
Patient called stating he needs Marcelline Deist and that he is going out of town.  He states multiple times "Trula Ore" helped me out and I need more medication.  Found he is speaking of Belenda Cruise in pharmacy.  Looks as if working on PA for the medication . Will see if it is approved or if patient will need samples of a medication from our office We do have samples of Farxiga 10mg  if needed.

## 2022-05-31 NOTE — Telephone Encounter (Signed)
Pt c/o medication issue:  1. Name of Medication: FARXIGA 10 MG TABS tablet   2. How are you currently taking this medication (dosage and times per day)?   3. Are you having a reaction (difficulty breathing--STAT)?   4. What is your medication issue? Patient would like a call back to discuss this medication. He states he was told that this medication and Dr. Royann Shivers were not in network and he would like to discuss this with Dr. Erin Hearing nurse.

## 2022-06-01 NOTE — Telephone Encounter (Signed)
LMOM for patient to return call.

## 2022-06-07 MED ORDER — DAPAGLIFLOZIN PROPANEDIOL 10 MG PO TABS: 10.0000 mg | ORAL_TABLET | Freq: Every day | ORAL | 0 refills | Status: DC

## 2022-06-07 NOTE — Telephone Encounter (Signed)
LMOM - stated if still having problem with co-pay cards to reach out to office.

## 2022-06-07 NOTE — Telephone Encounter (Signed)
Patient returned call - noted that [place] co-pay card still does not work.  Offered samples x 4 weeks.  Patient would like to pick up by 5 pm today.

## 2022-06-07 NOTE — Addendum Note (Signed)
Addended by: Cheree Ditto on: 06/07/2022 04:17 PM   Modules accepted: Orders

## 2022-06-21 DIAGNOSIS — J3089 Other allergic rhinitis: Secondary | ICD-10-CM | POA: Diagnosis not present

## 2022-06-21 DIAGNOSIS — J301 Allergic rhinitis due to pollen: Secondary | ICD-10-CM | POA: Diagnosis not present

## 2022-06-21 DIAGNOSIS — J3081 Allergic rhinitis due to animal (cat) (dog) hair and dander: Secondary | ICD-10-CM | POA: Diagnosis not present

## 2022-07-04 DIAGNOSIS — F900 Attention-deficit hyperactivity disorder, predominantly inattentive type: Secondary | ICD-10-CM | POA: Diagnosis not present

## 2022-07-12 DIAGNOSIS — F411 Generalized anxiety disorder: Secondary | ICD-10-CM | POA: Diagnosis not present

## 2022-08-03 LAB — HM DIABETES EYE EXAM

## 2022-08-19 DIAGNOSIS — M546 Pain in thoracic spine: Secondary | ICD-10-CM | POA: Diagnosis not present

## 2022-08-19 DIAGNOSIS — M5451 Vertebrogenic low back pain: Secondary | ICD-10-CM | POA: Diagnosis not present

## 2022-08-22 DIAGNOSIS — M5451 Vertebrogenic low back pain: Secondary | ICD-10-CM | POA: Diagnosis not present

## 2022-08-22 DIAGNOSIS — M546 Pain in thoracic spine: Secondary | ICD-10-CM | POA: Diagnosis not present

## 2022-08-24 ENCOUNTER — Ambulatory Visit
Admission: RE | Admit: 2022-08-24 | Discharge: 2022-08-24 | Disposition: A | Discharge status: Home / Self Care | Payer: BC Managed Care – PPO | Source: Ambulatory Visit | Attending: Sports Medicine | Admitting: Sports Medicine

## 2022-08-24 ENCOUNTER — Ambulatory Visit (INDEPENDENT_AMBULATORY_CARE_PROVIDER_SITE_OTHER): Payer: BC Managed Care – PPO | Admitting: Sports Medicine

## 2022-08-24 VITALS — BP 138/88 | Ht 68.0 in | Wt 189.0 lb

## 2022-08-24 DIAGNOSIS — M545 Low back pain, unspecified: Secondary | ICD-10-CM

## 2022-08-24 DIAGNOSIS — M4316 Spondylolisthesis, lumbar region: Secondary | ICD-10-CM | POA: Insufficient documentation

## 2022-08-24 DIAGNOSIS — M5451 Vertebrogenic low back pain: Secondary | ICD-10-CM | POA: Diagnosis not present

## 2022-08-24 DIAGNOSIS — M546 Pain in thoracic spine: Secondary | ICD-10-CM | POA: Diagnosis not present

## 2022-08-24 NOTE — Progress Notes (Signed)
Chief complaint low back pain  Patient traveling so has been writing a lot in cars.  He has been sleeping in an unfamiliar beds.  He lifted a propane gas container 3 days ago and had sharp low back pain more to his right side.  He has been able to get a couple of PT sessions which apparently helped somewhat.  The pain does not radiate down his leg.  He does not feel any weakness.  However he is concerned with doing some exercise and preventing any recurrence or worsening of his symptoms.  Of note he felt a fairly sharp immediate pain with the injury.  From my past evaluations he has an area of's spondylolisthesis that is grade 1 in his lumbar spine. I discussed that we needed to reevaluate that as it has been several years 2020 since his last x-ray and make sure that he has not caused this to slip further.  Physical exam Older male in no acute distress BP 138/88   Ht 5\' 8"  (1.727 m)   Wt 189 lb (85.7 kg)   BMI 28.74 kg/m   Palpation of his low back does not reveal a specific area of significant pain or spasm He has full flexion of his lumbar spine On back extension he gets pain in 10 to 20 degrees Rotation feels tight more to the right side Lateral bending is tight as well  He can do heel and toe walk He can stand on his lateral foot Muscular testing reveals no weakness

## 2022-08-24 NOTE — Assessment & Plan Note (Addendum)
Based on his x-rays and exam I do not think he did any significant damage to his disc or to his spondylolisthesis I believe continuing physical therapy will be appropriate He continues Advil as needed No new medications prescribed today  He can return to see me if this is not resolving over the next 2 to 3 weeks  I called and discussed these results with the patient and he will continue conservative care and moderate his activity for the next few weeks.

## 2022-08-29 DIAGNOSIS — M5451 Vertebrogenic low back pain: Secondary | ICD-10-CM | POA: Diagnosis not present

## 2022-08-29 DIAGNOSIS — M546 Pain in thoracic spine: Secondary | ICD-10-CM | POA: Diagnosis not present

## 2022-08-31 DIAGNOSIS — M546 Pain in thoracic spine: Secondary | ICD-10-CM | POA: Diagnosis not present

## 2022-08-31 DIAGNOSIS — M5451 Vertebrogenic low back pain: Secondary | ICD-10-CM | POA: Diagnosis not present

## 2022-09-01 DIAGNOSIS — J3089 Other allergic rhinitis: Secondary | ICD-10-CM | POA: Diagnosis not present

## 2022-09-01 DIAGNOSIS — J3081 Allergic rhinitis due to animal (cat) (dog) hair and dander: Secondary | ICD-10-CM | POA: Diagnosis not present

## 2022-09-01 DIAGNOSIS — J301 Allergic rhinitis due to pollen: Secondary | ICD-10-CM | POA: Diagnosis not present

## 2022-09-01 DIAGNOSIS — T63441A Toxic effect of venom of bees, accidental (unintentional), initial encounter: Secondary | ICD-10-CM | POA: Diagnosis not present

## 2022-09-02 DIAGNOSIS — M5451 Vertebrogenic low back pain: Secondary | ICD-10-CM | POA: Diagnosis not present

## 2022-09-02 DIAGNOSIS — M546 Pain in thoracic spine: Secondary | ICD-10-CM | POA: Diagnosis not present

## 2022-09-08 DIAGNOSIS — M5451 Vertebrogenic low back pain: Secondary | ICD-10-CM | POA: Diagnosis not present

## 2022-09-08 DIAGNOSIS — M546 Pain in thoracic spine: Secondary | ICD-10-CM | POA: Diagnosis not present

## 2022-09-09 DIAGNOSIS — M5451 Vertebrogenic low back pain: Secondary | ICD-10-CM | POA: Diagnosis not present

## 2022-09-09 DIAGNOSIS — M546 Pain in thoracic spine: Secondary | ICD-10-CM | POA: Diagnosis not present

## 2022-09-12 DIAGNOSIS — M546 Pain in thoracic spine: Secondary | ICD-10-CM | POA: Diagnosis not present

## 2022-09-12 DIAGNOSIS — M5451 Vertebrogenic low back pain: Secondary | ICD-10-CM | POA: Diagnosis not present

## 2022-09-14 DIAGNOSIS — M546 Pain in thoracic spine: Secondary | ICD-10-CM | POA: Diagnosis not present

## 2022-09-14 DIAGNOSIS — M5451 Vertebrogenic low back pain: Secondary | ICD-10-CM | POA: Diagnosis not present

## 2022-09-16 DIAGNOSIS — M5451 Vertebrogenic low back pain: Secondary | ICD-10-CM | POA: Diagnosis not present

## 2022-09-16 DIAGNOSIS — M546 Pain in thoracic spine: Secondary | ICD-10-CM | POA: Diagnosis not present

## 2022-09-19 DIAGNOSIS — M546 Pain in thoracic spine: Secondary | ICD-10-CM | POA: Diagnosis not present

## 2022-09-19 DIAGNOSIS — M5451 Vertebrogenic low back pain: Secondary | ICD-10-CM | POA: Diagnosis not present

## 2022-09-21 DIAGNOSIS — M5451 Vertebrogenic low back pain: Secondary | ICD-10-CM | POA: Diagnosis not present

## 2022-09-21 DIAGNOSIS — M546 Pain in thoracic spine: Secondary | ICD-10-CM | POA: Diagnosis not present

## 2022-09-23 DIAGNOSIS — M5451 Vertebrogenic low back pain: Secondary | ICD-10-CM | POA: Diagnosis not present

## 2022-09-23 DIAGNOSIS — M546 Pain in thoracic spine: Secondary | ICD-10-CM | POA: Diagnosis not present

## 2022-09-26 DIAGNOSIS — M546 Pain in thoracic spine: Secondary | ICD-10-CM | POA: Diagnosis not present

## 2022-09-26 DIAGNOSIS — F900 Attention-deficit hyperactivity disorder, predominantly inattentive type: Secondary | ICD-10-CM | POA: Diagnosis not present

## 2022-09-26 DIAGNOSIS — M5451 Vertebrogenic low back pain: Secondary | ICD-10-CM | POA: Diagnosis not present

## 2022-09-28 DIAGNOSIS — M5451 Vertebrogenic low back pain: Secondary | ICD-10-CM | POA: Diagnosis not present

## 2022-09-28 DIAGNOSIS — M546 Pain in thoracic spine: Secondary | ICD-10-CM | POA: Diagnosis not present

## 2022-10-03 DIAGNOSIS — M546 Pain in thoracic spine: Secondary | ICD-10-CM | POA: Diagnosis not present

## 2022-10-03 DIAGNOSIS — M5451 Vertebrogenic low back pain: Secondary | ICD-10-CM | POA: Diagnosis not present

## 2022-10-05 DIAGNOSIS — M546 Pain in thoracic spine: Secondary | ICD-10-CM | POA: Diagnosis not present

## 2022-10-05 DIAGNOSIS — M5451 Vertebrogenic low back pain: Secondary | ICD-10-CM | POA: Diagnosis not present

## 2022-10-10 DIAGNOSIS — M5451 Vertebrogenic low back pain: Secondary | ICD-10-CM | POA: Diagnosis not present

## 2022-10-10 DIAGNOSIS — M546 Pain in thoracic spine: Secondary | ICD-10-CM | POA: Diagnosis not present

## 2022-10-12 ENCOUNTER — Ambulatory Visit: Payer: BC Managed Care – PPO | Admitting: Sports Medicine

## 2022-10-12 VITALS — BP 120/82 | Ht 68.0 in | Wt 190.0 lb

## 2022-10-12 DIAGNOSIS — M546 Pain in thoracic spine: Secondary | ICD-10-CM | POA: Diagnosis not present

## 2022-10-12 DIAGNOSIS — M7742 Metatarsalgia, left foot: Secondary | ICD-10-CM | POA: Diagnosis not present

## 2022-10-12 DIAGNOSIS — M5451 Vertebrogenic low back pain: Secondary | ICD-10-CM | POA: Diagnosis not present

## 2022-10-12 NOTE — Assessment & Plan Note (Signed)
Today the patient was evaluated and placed into sports insoles with medium metatarsal pads At completion of this he could walk with pressure much less in his forefoot He will continue to use these over the next month If this does well we will place metatarsal pads and all shoes If it worsens we can consider custom orthotics

## 2022-10-12 NOTE — Progress Notes (Signed)
Chief complaint left forefoot pain  Patient started noticing pain in his forefoot [place] airports.  The pain is worse when he stands or walks a lot.  There is some numbness that goes out to his left second third and fourth toes at times.  The pain tends to localize under his metatarsal phalangeal joints.  No prior injury to this area No swelling or other abnormalities noted  Exam Pleasant white male in no acute distress BP 120/82   Ht 5\' 8"  (1.727 m)   Wt 190 lb (86.2 kg)   BMI 28.89 kg/m   Examination of the left foot There is significant splaying among the toes There is loss of the transverse arch at the plantar plate He has a Morton's callus There is reversal of the normal arch shape on the plantar surface No significant palpable pain Along arch is preserved  Right foot Mild flattening of his transverse arch at the level of the plantar plate No significant splaying of the toes at this point Long arch is preserved No tenderness to palpation

## 2022-10-18 DIAGNOSIS — M546 Pain in thoracic spine: Secondary | ICD-10-CM | POA: Diagnosis not present

## 2022-10-18 DIAGNOSIS — M5451 Vertebrogenic low back pain: Secondary | ICD-10-CM | POA: Diagnosis not present

## 2022-10-26 DIAGNOSIS — L821 Other seborrheic keratosis: Secondary | ICD-10-CM | POA: Diagnosis not present

## 2022-10-26 DIAGNOSIS — L812 Freckles: Secondary | ICD-10-CM | POA: Diagnosis not present

## 2022-10-26 DIAGNOSIS — L308 Other specified dermatitis: Secondary | ICD-10-CM | POA: Diagnosis not present

## 2022-10-26 DIAGNOSIS — L820 Inflamed seborrheic keratosis: Secondary | ICD-10-CM | POA: Diagnosis not present

## 2022-10-26 DIAGNOSIS — D225 Melanocytic nevi of trunk: Secondary | ICD-10-CM | POA: Diagnosis not present

## 2022-11-08 ENCOUNTER — Telehealth: Payer: Self-pay | Admitting: Cardiovascular Disease

## 2022-11-08 NOTE — Telephone Encounter (Signed)
Patient wanted to let provider know that he has not been taking [place]. He states he has gained weight with [place]. He is wondering  if you can prescribe ozempic or wegovy so he can lose weight.

## 2022-11-08 NOTE — Telephone Encounter (Signed)
Pt c/o medication issue:  1. Name of Medication:   FARXIGA 10 MG TABS tablet   2. How are you currently taking this medication (dosage and times per day)?   3. Are you having a reaction (difficulty breathing--STAT)?   4. What is your medication issue?   Patient wants a call back directly from Dr. Royann Shivers or his nurse regarding questions he has about this medication.

## 2022-11-08 NOTE — Telephone Encounter (Signed)
I think he now qualifies for semaglutide (which has brand names Ozempic and Wegovy) since he has no history of myocardial infarction and is overweight (BMI 29), per the most recent Medicare guidelines.  Will ask our pharmacy team to see if his insurance plan does indeed approve it and they will then help [place] start the medication and titrate the dose.

## 2022-11-09 ENCOUNTER — Ambulatory Visit (INDEPENDENT_AMBULATORY_CARE_PROVIDER_SITE_OTHER): Payer: BC Managed Care – PPO | Admitting: Sports Medicine

## 2022-11-09 ENCOUNTER — Telehealth: Payer: Self-pay | Admitting: Pharmacy Technician

## 2022-11-09 ENCOUNTER — Other Ambulatory Visit (HOSPITAL_COMMUNITY): Payer: Self-pay

## 2022-11-09 ENCOUNTER — Encounter: Payer: Self-pay | Admitting: Cardiovascular Disease

## 2022-11-09 VITALS — BP 128/84 | Ht 68.0 in | Wt 190.0 lb

## 2022-11-09 DIAGNOSIS — M7742 Metatarsalgia, left foot: Secondary | ICD-10-CM | POA: Diagnosis not present

## 2022-11-09 NOTE — Telephone Encounter (Signed)
Pharmacy Patient Advocate Encounter  Received notification from [facility] that Prior Authorization for wegovy has been DENIED.  See denial reason below. No denial letter attached in CMM. Will attache denial letter to Media tab once received.   PA #/Case ID/Reference #: [id number]

## 2022-11-09 NOTE — Telephone Encounter (Signed)
Pt stated he was returning call to Demetris since he told her he'd give her a callback and is now requesting a callback to update. Please advise

## 2022-11-09 NOTE — Telephone Encounter (Signed)
Pharmacy Patient Advocate Encounter   Received notification from Pt Calls Messages that prior authorization for wegovy is required/requested.   Insurance verification completed.   The patient is insured through [facility] .   Per test claim: PA required; PA submitted to [employer] via CoverMyMeds Key/confirmation #/EOC [id number] Status is pending

## 2022-11-09 NOTE — Telephone Encounter (Signed)
Patient already notified via My Chart.  Message pending with pharmacy

## 2022-11-09 NOTE — Telephone Encounter (Signed)
PA request has been Submitted. New Encounter created for follow up. For additional info see Pharmacy Prior Auth telephone encounter from 11/09/22.

## 2022-11-09 NOTE — Assessment & Plan Note (Signed)
Continue long-term orthotic padding with metatarsal pads He has better using these on both feet Do not think he needs a customized orthotic insole because he seems to be doing well with the current corrections He was given a catalog to order these to replace as needed He can return to see me as needed

## 2022-11-09 NOTE — Progress Notes (Signed)
Complaint bilateral metatarsalgia  Patient got almost complete relief in his work boots with using the sports insoles with medium metatarsal pads He continues to use these with the success Brings other shoes today for metatarsal padding see if he can use those for walking and everyday use  No new issues although he would like to lose weight Did discuss options and he is speaking with his cardiologist  Physical exam Patient in no acute distress BP 128/84   Ht 5\' 8"  (1.727 m)   Wt 190 lb (86.2 kg)   BMI 28.89 kg/m   Flattening of the transverse arch is noted bilaterally Positioning of the metatarsal pads just behind the second and third metatarsal heads seems to correct the transverse flattening Examined position and his additional shoes Replacing metatarsal pads and these he was able to walk with a normal gait and no antalgia

## 2022-11-09 NOTE — Telephone Encounter (Signed)
Pharmacy Patient Advocate Encounter   Received notification from Pt Calls Messages that prior authorization for wegovy is required/requested.   Insurance verification completed.   The patient is insured through [facility] .   Per test claim: PA required; PA started via CoverMyMeds. KEY [id number] . Waiting for clinical questions to populate.

## 2022-11-09 NOTE — Telephone Encounter (Signed)
Spoke with patient and he is aware his insurance plan does not cover wegovy. States he will his insurance and give [place] a call back

## 2022-11-10 ENCOUNTER — Other Ambulatory Visit (HOSPITAL_COMMUNITY): Payer: Self-pay

## 2022-11-10 NOTE — Telephone Encounter (Signed)
Spoke with plan and they will not take non form exception as the next step for approval. Next step needed is an appeal per plan. Coordinated with [facility] to submit appeal. Status is pending.

## 2022-11-14 MED ORDER — WEGOVY 0.25 MG/0.5ML ~~LOC~~ SOAJ: 0.2500 mg | SUBCUTANEOUS | 0 refills | Status: DC

## 2022-11-14 NOTE — Addendum Note (Signed)
Addended by: Malena Peer D on: 11/14/2022 10:45 AM   Modules accepted: Orders

## 2022-11-16 ENCOUNTER — Telehealth: Payer: Self-pay | Admitting: Cardiovascular Disease

## 2022-11-16 NOTE — Telephone Encounter (Signed)
Spoke patient and he states [employer] looks at the notes from last march the notes does not reflect that he is a type 2 diabetic. He states Onalee Hua from [place] and stated of the notes reflect that like it did for wegovy it will be approved.

## 2022-11-16 NOTE — Telephone Encounter (Signed)
No diabetes. Just prediabetes

## 2022-11-16 NOTE — Telephone Encounter (Signed)
That was incorrect documentation. Also they will generally ask for an A1C, which we cannot provide documentation of one in diabetic range. They wouldn't approve Wegovy, it would be ozempic. We stopped ordering this medication off label when they really started cracking down on it, and when it caused a shortage for people who really had DM.  We can check with Dr. Salena Saner, but I prefer not to order off label at this time.

## 2022-11-16 NOTE — Telephone Encounter (Signed)
Called patiene and relayed information .  He wants to argue the point of documentation and advised it was incorrect and we apologize for this.  He ask what if he changed in insurance company.  Advised that with out the HX diabetes they will most likely not be approved.  Advised he could try to pay out of pocket but it is costly. He states understanding

## 2022-11-16 NOTE — Telephone Encounter (Signed)
I already communicated with this patient via mychart. He is not a diabetic. His A1C has never been 6.5 or more. Therefore Ozmepic or Greggory Keen will not be approved. His insurance denied the [facility] because they will not pay for weight loss medications. They don't yet recognize the CV indication yet.

## 2022-11-16 NOTE — Telephone Encounter (Signed)
When I go into his cover mymed from when it was submitted, it looks like it did not actually need a PA. This can be something we re-evaluate if it ever comes that it needed a PA. We can also use HF recEF as an indication for that. So he shouldn't worry about staying on that medication.

## 2022-11-16 NOTE — Telephone Encounter (Signed)
Pt states he would like to speak with Dr. Erin Hearing nurse. Please advise

## 2022-11-17 ENCOUNTER — Other Ambulatory Visit (HOSPITAL_COMMUNITY): Payer: Self-pay

## 2022-11-18 ENCOUNTER — Other Ambulatory Visit (HOSPITAL_COMMUNITY): Payer: Self-pay

## 2022-12-06 ENCOUNTER — Encounter: Payer: Self-pay | Admitting: Family Medicine

## 2022-12-06 ENCOUNTER — Other Ambulatory Visit (INDEPENDENT_AMBULATORY_CARE_PROVIDER_SITE_OTHER): Payer: BC Managed Care – PPO

## 2022-12-06 ENCOUNTER — Ambulatory Visit: Payer: BC Managed Care – PPO | Admitting: Family Medicine

## 2022-12-06 VITALS — BP 130/80 | HR 86 | Temp 97.6°F | Ht 68.0 in | Wt 200.0 lb

## 2022-12-06 DIAGNOSIS — R202 Paresthesia of skin: Secondary | ICD-10-CM | POA: Diagnosis not present

## 2022-12-06 DIAGNOSIS — Z0000 Encounter for general adult medical examination without abnormal findings: Secondary | ICD-10-CM

## 2022-12-06 DIAGNOSIS — Z23 Encounter for immunization: Secondary | ICD-10-CM | POA: Diagnosis not present

## 2022-12-06 DIAGNOSIS — F411 Generalized anxiety disorder: Secondary | ICD-10-CM | POA: Diagnosis not present

## 2022-12-06 LAB — CBC WITH DIFFERENTIAL/PLATELET
Basophils Absolute: 0.2 K/uL — ABNORMAL HIGH (ref 0.0–0.1)
Basophils Relative: 1.8 % (ref 0.0–3.0)
Eosinophils Absolute: 0.2 K/uL (ref 0.0–0.7)
Eosinophils Relative: 1.8 % (ref 0.0–5.0)
HCT: 47.2 % (ref 39.0–52.0)
Hemoglobin: 15.2 g/dL (ref 13.0–17.0)
Lymphocytes Relative: 22.4 % (ref 12.0–46.0)
Lymphs Abs: 2 K/uL (ref 0.7–4.0)
MCHC: 32.1 g/dL (ref 30.0–36.0)
MCV: 87.6 fl (ref 78.0–100.0)
Monocytes Absolute: 0.9 K/uL (ref 0.1–1.0)
Monocytes Relative: 9.4 % (ref 3.0–12.0)
Neutro Abs: 5.8 K/uL (ref 1.4–7.7)
Neutrophils Relative %: 64.6 % (ref 43.0–77.0)
Platelets: 241 K/uL (ref 150.0–400.0)
RBC: 5.39 Mil/uL (ref 4.22–5.81)
RDW: 13 % (ref 11.5–15.5)
WBC: 9.1 K/uL (ref 4.0–10.5)

## 2022-12-06 LAB — BASIC METABOLIC PANEL
BUN: 16 mg/dL (ref 6–23)
CO2: 25 meq/L (ref 19–32)
Calcium: 9.2 mg/dL (ref 8.4–10.5)
Chloride: 102 meq/L (ref 96–112)
Creatinine, Ser: 0.78 mg/dL (ref 0.40–1.50)
GFR: 95.06 mL/min (ref >60.00)
Glucose, Bld: 84 mg/dL (ref 70–99)
Potassium: 4 meq/L (ref 3.5–5.1)
Sodium: 135 meq/L (ref 135–145)

## 2022-12-06 LAB — HEPATIC FUNCTION PANEL
ALT: 30 U/L (ref 0–53)
AST: 24 U/L (ref 0–37)
Albumin: 4.4 g/dL (ref 3.5–5.2)
Alkaline Phosphatase: 69 U/L (ref 39–117)
Bilirubin, Direct: 0.1 mg/dL (ref 0.0–0.3)
Total Bilirubin: 0.5 mg/dL (ref 0.2–1.2)
Total Protein: 7.4 g/dL (ref 6.0–8.3)

## 2022-12-06 LAB — LIPID PANEL
Cholesterol: 104 mg/dL (ref 0–200)
HDL: 36.3 mg/dL — ABNORMAL LOW (ref >39.00)
LDL Cholesterol: 41 mg/dL (ref 0–99)
NonHDL: 68.03
Total CHOL/HDL Ratio: 3
Triglycerides: 134 mg/dL (ref 0.0–149.0)
VLDL: 26.8 mg/dL (ref 0.0–40.0)

## 2022-12-06 LAB — HEMOGLOBIN A1C: Hgb A1c MFr Bld: 6 % (ref 4.6–6.5)

## 2022-12-06 LAB — PSA: PSA: 13.1 ng/mL — ABNORMAL HIGH (ref 0.10–4.00)

## 2022-12-06 LAB — VITAMIN B12: Vitamin B-12: 552 pg/mL (ref 211–911)

## 2022-12-06 NOTE — Progress Notes (Signed)
Established Patient Office Visit  Subjective   Patient ID: Damon [name], male    DOB: 07-Mar-1960  Age: 62 y.o. MRN: [id number]  Chief Complaint  Patient presents with   Annual Exam    HPI   Damon [name] is seen today for physical exam.  His past medical history is reviewed and significant for hypertension, CAD, Raynaud's, prediabetes, dyslipidemia.  Generally doing well.  Has had some weight gain this past year.  Was prescribed Wegovy per cardiology but insurance would not cover.  He did not see much weight loss with [place].  He has recently had some paresthesias in both feet.  Recently saw sports medicine and had orthotics but has not seen any improvement thus far.  Denies any weakness lower extremities.  No pain.  Does have a history of prediabetes with last A1c 6.2%.  No heavy alcohol use.  No chronic PPI use or other specific risk factors for B12 deficiency  Health maintenance reviewed:  Health Maintenance  Topic Date Due   FOOT EXAM  Never done   OPHTHALMOLOGY EXAM  Never done   Diabetic kidney evaluation - Urine ACR  Never done   HEMOGLOBIN A1C  05/31/2022   COVID-19 Vaccine (3 - 2023-24 season) 10/23/2022   Diabetic kidney evaluation - eGFR measurement  12/10/2022   Colonoscopy  11/27/2024   DTaP/Tdap/Td (3 - Td or Tdap) 11/21/2029   INFLUENZA VACCINE  Completed   Hepatitis C Screening  Completed   HIV Screening  Completed   Zoster Vaccines- Shingrix  Completed   HPV VACCINES  Aged Out   He does request flu vaccine today.  Social history-quit smoking 1997.  Occasional alcohol use.  No history of binge drinking.  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  Family history-father is 72 and may have some dementia.  Mom died of Alzheimer's complications.  2 sisters.  No active medical problems.  Past Medical History:  Diagnosis Date   Allergy    seasonal    Coronary artery disease    Depression    situational    Heart  murmur    hx of in childhood    Hyperlipidemia    Hypertension    Injury of tendon of long head of right biceps    January 2016   Past Surgical History:  Procedure Laterality Date   APPENDECTOMY     CARDIAC CATHETERIZATION     COLONOSCOPY N/A 11/28/2014   Procedure: COLONOSCOPY;  Surgeon: Louis Meckel, MD;  Location: [facility] ENDOSCOPY;  Service: Endoscopy;  Laterality: N/A;   colonscopy      removed polyps   CORONARY STENT INTERVENTION N/A 11/20/2017   Procedure: CORONARY STENT INTERVENTION;  Surgeon: Runell Gess, MD;  Location: [facility] INVASIVE CV LAB;  Service: Cardiovascular;  Laterality: N/A;   INGUINAL HERNIA REPAIR Left 06/12/2014   Procedure: LEFT INGUINAL HERNIA REPAIR WITH MESH;  Surgeon: Darnell Level, MD;  Location: [facility] ORS;  Service: General;  Laterality: Left;   INSERTION OF MESH Left 06/12/2014   Procedure: INSERTION OF MESH;  Surgeon: Darnell Level, MD;  Location: [facility] ORS;  Service: General;  Laterality: Left;   LEFT HEART CATH AND CORONARY ANGIOGRAPHY N/A 11/20/2017   Procedure: LEFT HEART CATH AND CORONARY ANGIOGRAPHY;  Surgeon: Runell Gess, MD;  Location: [facility] INVASIVE CV LAB;  Service: Cardiovascular;  Laterality: N/A;   MOUTH SURGERY     wisdom teeth removed;root canal - 03/2014  TONSILLECTOMY  1995   TONSILLECTOMY      reports that he quit smoking about 27 years ago. His smoking use included cigarettes. He started smoking about 50 years ago. He has a 34.5 pack-year smoking history. He has never used smokeless tobacco. He reports current alcohol use. He reports that he does not use drugs. family history includes AAA (abdominal aortic aneurysm) in his mother; Alzheimer's disease in his mother; Cancer in his paternal grandfather and another family member; Depression in an other family member; Diabetes in his father; Heart disease in his paternal grandfather and another family member; Hyperlipidemia in an other family member; Hypertension in an other family member; Stroke in an  other family member. Allergies  Allergen Reactions   Hornet Venom Anaphylaxis, Itching, Other (See Comments) and Swelling   Wasp Venom Anaphylaxis, Anxiety, Itching and Swelling   Yellow Jacket Venom Anaphylaxis, Anxiety, Other (See Comments) and Swelling   Poison Ivy Extract [Poison Ivy Extract] Itching and Rash   Poison Oak Extract [Poison Oak Extract] Itching and Rash    Review of Systems  Constitutional:  Negative for chills, fever, malaise/fatigue and weight loss.  HENT:  Negative for hearing loss.   Eyes:  Negative for blurred vision and double vision.  Respiratory:  Negative for cough and shortness of breath.   Cardiovascular:  Negative for chest pain, palpitations and leg swelling.  Gastrointestinal:  Negative for abdominal pain, blood in stool, constipation and diarrhea.  Genitourinary:  Negative for dysuria.  Skin:  Negative for rash.  Neurological:  Negative for dizziness, speech change, seizures, loss of consciousness and headaches.  Psychiatric/Behavioral:  Negative for depression.       Objective:     BP 130/80 (BP Location: Left Arm, Patient Position: Sitting, Cuff Size: Normal)   Pulse 86   Temp 97.6 F (36.4 C) (Oral)   Ht 5\' 8"  (1.727 m)   Wt 200 lb (90.7 kg)   SpO2 98%   BMI 30.41 kg/m  BP Readings from Last 3 Encounters:  12/06/22 130/80  11/09/22 128/84  10/12/22 120/82   Wt Readings from Last 3 Encounters:  12/06/22 200 lb (90.7 kg)  11/09/22 190 lb (86.2 kg)  10/12/22 190 lb (86.2 kg)      Physical Exam Vitals reviewed.  Constitutional:      General: He is not in acute distress.    Appearance: He is well-developed.  HENT:     Head: Normocephalic and atraumatic.     Right Ear: External ear normal.     Left Ear: External ear normal.  Eyes:     Conjunctiva/sclera: Conjunctivae normal.     Pupils: Pupils are equal, round, and reactive to light.  Neck:     Thyroid: No thyromegaly.  Cardiovascular:     Rate and Rhythm: Normal rate and  regular rhythm.     Heart sounds: Normal heart sounds. No murmur heard. Pulmonary:     Effort: No respiratory distress.     Breath sounds: No wheezing or rales.  Abdominal:     General: Bowel sounds are normal. There is no distension.     Palpations: Abdomen is soft. There is no mass.     Tenderness: There is no abdominal tenderness. There is no guarding or rebound.  Musculoskeletal:     Cervical back: Normal range of motion and neck supple.     Comments: Only trace edema ankles bilaterally  Lymphadenopathy:     Cervical: No cervical adenopathy.  Skin:    Findings:  No rash.     Comments: Feet are slightly cool to touch.  Good capillary refill.  Neurological:     Mental Status: He is alert and oriented to person, place, and time.     Cranial Nerves: No cranial nerve deficit.     Comments: Sensation is intact to touch throughout in both feet.      No results found for any visits on 12/06/22.    The ASCVD Risk score (Arnett DK, et al., 2019) failed to calculate for the following reasons:   The patient has a prior MI or stroke diagnosis    Assessment & Plan:   Problem List Items Addressed This Visit   None Visit Diagnoses     Physical exam    -  Primary   Relevant Orders   Basic metabolic panel   Lipid panel   CBC with Differential/Platelet   Hepatic function panel   PSA   Paresthesia       Relevant Orders   Hemoglobin A1c   Vitamin B12   Need for influenza vaccination       Relevant Orders   Flu vaccine trivalent PF, 6mos and older(Flulaval,Afluria,Fluarix,Fluzone) (Completed)     62 year old male with chronic medical problems as above.  Has had some significant weight gain about 10 pounds or in the past year.  Past history of prediabetes.  Recent paresthesias of both feet of uncertain significance.  No improvement thus far with orthotics. -Check labs as above.  Include B12 with bilateral paresthesias.  Recheck A1c with history of prediabetes.  -Strongly  encouraged to lose some weight and healthy lifestyle strategies discussed.  -Flu vaccine given  -Will need repeat colonoscopy in a couple years   No follow-ups on file.    Evelena Peat, MD

## 2022-12-07 ENCOUNTER — Telehealth: Payer: Self-pay | Admitting: Family Medicine

## 2022-12-07 NOTE — Telephone Encounter (Signed)
Please see result note 

## 2022-12-07 NOTE — Telephone Encounter (Signed)
Returning call.

## 2022-12-20 ENCOUNTER — Other Ambulatory Visit: Payer: Self-pay | Admitting: Pharmacist

## 2022-12-20 DIAGNOSIS — I214 Non-ST elevation (NSTEMI) myocardial infarction: Secondary | ICD-10-CM

## 2022-12-20 MED ORDER — WEGOVY 0.25 MG/0.5ML ~~LOC~~ SOAJ
0.2500 mg | SUBCUTANEOUS | 0 refills | Status: DC
  Filled 2022-12-20: qty 2, 28d supply, fill #0

## 2022-12-21 ENCOUNTER — Other Ambulatory Visit (HOSPITAL_COMMUNITY): Payer: Self-pay

## 2022-12-21 DIAGNOSIS — R972 Elevated prostate specific antigen [PSA]: Secondary | ICD-10-CM | POA: Diagnosis not present

## 2022-12-22 DIAGNOSIS — N401 Enlarged prostate with lower urinary tract symptoms: Secondary | ICD-10-CM | POA: Diagnosis not present

## 2022-12-22 DIAGNOSIS — R972 Elevated prostate specific antigen [PSA]: Secondary | ICD-10-CM | POA: Diagnosis not present

## 2022-12-22 DIAGNOSIS — D075 Carcinoma in situ of prostate: Secondary | ICD-10-CM | POA: Diagnosis not present

## 2022-12-22 DIAGNOSIS — N138 Other obstructive and reflux uropathy: Secondary | ICD-10-CM | POA: Diagnosis not present

## 2022-12-29 ENCOUNTER — Other Ambulatory Visit (HOSPITAL_COMMUNITY): Payer: Self-pay

## 2022-12-29 DIAGNOSIS — F900 Attention-deficit hyperactivity disorder, predominantly inattentive type: Secondary | ICD-10-CM | POA: Diagnosis not present

## 2023-01-02 ENCOUNTER — Other Ambulatory Visit (HOSPITAL_COMMUNITY): Payer: Self-pay

## 2023-01-13 DIAGNOSIS — R9389 Abnormal findings on diagnostic imaging of other specified body structures: Secondary | ICD-10-CM | POA: Diagnosis not present

## 2023-01-13 DIAGNOSIS — R972 Elevated prostate specific antigen [PSA]: Secondary | ICD-10-CM | POA: Diagnosis not present

## 2023-01-16 ENCOUNTER — Encounter: Payer: Self-pay | Admitting: Cardiovascular Disease

## 2023-01-16 ENCOUNTER — Ambulatory Visit: Payer: BC Managed Care – PPO | Attending: Cardiovascular Disease | Admitting: Cardiovascular Disease

## 2023-01-16 VITALS — BP 112/82 | HR 79 | Ht 68.0 in | Wt 198.0 lb

## 2023-01-16 DIAGNOSIS — I2510 Atherosclerotic heart disease of native coronary artery without angina pectoris: Secondary | ICD-10-CM

## 2023-01-16 DIAGNOSIS — I7300 Raynaud's syndrome without gangrene: Secondary | ICD-10-CM

## 2023-01-16 DIAGNOSIS — R7303 Prediabetes: Secondary | ICD-10-CM

## 2023-01-16 DIAGNOSIS — E7800 Pure hypercholesterolemia, unspecified: Secondary | ICD-10-CM

## 2023-01-16 DIAGNOSIS — I10 Essential (primary) hypertension: Secondary | ICD-10-CM | POA: Diagnosis not present

## 2023-01-16 NOTE — Patient Instructions (Signed)
Medication Instructions:  Our Pharmacist is working on getting this authorized *If you need a refill on your cardiac medications before your next appointment, please call your pharmacy*  Follow-Up: At [facility], you and your health needs are our priority.  As part of our continuing mission to provide you with exceptional heart care, we have created designated Provider Care Teams.  These Care Teams include your primary Cardiologist (physician) and Advanced Practice Providers (APPs -  Physician Assistants and Nurse Practitioners) who all work together to provide you with the care you need, when you need it.  We recommend signing up for the patient portal called "MyChart".  Sign up information is provided on this After Visit Summary.  MyChart is used to connect with patients for Virtual Visits (Telemedicine).  Patients are able to view lab/test results, encounter notes, upcoming appointments, etc.  Non-urgent messages can be sent to your provider as well.   To learn more about what you can do with MyChart, go to [email or web address].    Your next appointment:   1 year(s)  Provider:   Thurmon Fair, MD

## 2023-01-16 NOTE — Progress Notes (Signed)
Cardiology Office Note:    Date:  01/23/2023   ID:  Damon [name], DOB Oct 13, 1960, MRN [id number]  PCP:  Kristian Covey, MD  Cardiologist:  Thurmon Fair, MD  Electrophysiologist:  None   Referring MD: Kristian Covey, MD   Chief Complaint  Patient presents with   Coronary Artery Disease     History of Present Illness:    Damon [name] is a 62 y.o. male with a hx of acute non-STEMI leading to placement of a drug-eluting stent in the ramus intermedius artery November 20, 2017, hyperlipidemia, hypertension, Raynaud's syndrome.  His wife accompanies him today.  He is generally feeling okay except that he feels his over exercise tolerance has diminished substantially over the last few years.  He acknowledges that this is in good part due to the fact that he is gained a lot of weight.  He is now borderline obese with a BMI just over 30.  He is already taking [place].  He is interested in starting to take GLP-1 agonist, specifically Wegovy to help him with weight loss.  He has not had chest pain at rest or with activity and does not have any complaints of Raynaud's syndrome.  Denies edema, palpitations, syncope.  His most recent lipid profile shows an excellent LDL at 41 but also a chronically low HDL at 36.  Triglycerides are normal.  Echo at presentation in September 2019 showed LVEF 45-50% with lateral wall motion abnormalities.  Follow-up echo on December 5 shows resolution of the regional abnormalities with an EF of 50-55%.   Past Medical History:  Diagnosis Date   Allergy    seasonal    Coronary artery disease    Depression    situational    Heart murmur    hx of in childhood    Hyperlipidemia    Hypertension    Injury of tendon of long head of right biceps    January 2016    Past Surgical History:  Procedure Laterality Date   APPENDECTOMY     CARDIAC CATHETERIZATION     COLONOSCOPY N/A 11/28/2014   Procedure: COLONOSCOPY;  Surgeon: Louis Meckel, MD;   Location: [facility];  Service: Endoscopy;  Laterality: N/A;   colonscopy      removed polyps   CORONARY STENT INTERVENTION N/A 11/20/2017   Procedure: CORONARY STENT INTERVENTION;  Surgeon: Runell Gess, MD;  [facility];  Service: Cardiovascular;  Laterality: N/A;   INGUINAL HERNIA REPAIR Left 06/12/2014   Procedure: LEFT INGUINAL HERNIA REPAIR WITH MESH;  Surgeon: Darnell Level, MD;  Location: [facility];  Service: General;  Laterality: Left;   INSERTION OF MESH Left 06/12/2014   Procedure: INSERTION OF MESH;  Surgeon: Darnell Level, MD;  Location: [facility];  Service: General;  Laterality: Left;   LEFT HEART CATH AND CORONARY ANGIOGRAPHY N/A 11/20/2017   Procedure: LEFT HEART CATH AND CORONARY ANGIOGRAPHY;  Surgeon: Runell Gess, MD;  [facility];  Service: Cardiovascular;  Laterality: N/A;   MOUTH SURGERY     wisdom teeth removed;root canal - 03/2014   TONSILLECTOMY  1995   TONSILLECTOMY      Current Medications: Current Meds  Medication Sig   amLODipine (NORVASC) 5 MG tablet Take 1 tablet (5 mg total) by mouth daily.   aspirin EC 81 MG EC tablet Take 1 tablet (81 mg total) by mouth daily.   cetirizine (ZYRTEC) 10 MG tablet Take 10 mg by mouth 2 (two)  times daily as needed.    clonazePAM (KLONOPIN) 0.5 MG tablet Take 0.5-1 mg by mouth See admin instructions. Take 2 tablets in the morning and 1 tablet in the evening. May take 1 additional tablet as needed for anxiety   dapagliflozin propanediol (FARXIGA) 10 MG TABS tablet Take 1 tablet (10 mg total) by mouth daily.   ezetimibe (ZETIA) 10 MG tablet Take 1 tablet (10 mg total) by mouth daily.   FARXIGA 10 MG TABS tablet Take 1 tablet (10 mg total) by mouth daily before breakfast.   lisdexamfetamine (VYVANSE) 30 MG capsule Take 30 mg by mouth daily in the afternoon.   lisinopril (ZESTRIL) 20 MG tablet Take 1 tablet (20 mg total) by mouth daily.   rosuvastatin (CRESTOR) 40 MG tablet Take 1 tablet (40 mg  total) by mouth daily.   Semaglutide-Weight Management (WEGOVY) 0.25 MG/0.5ML SOAJ Inject 0.25 mg into the skin once a week.   tamsulosin (FLOMAX) 0.4 MG CAPS capsule Take 1 capsule (0.4 mg total) by mouth daily.     Allergies:   Hornet venom, Wasp venom, Yellow jacket venom, Poison ivy extract [poison ivy extract], and Poison oak extract [poison oak extract]   Social History   Socioeconomic History   Marital status: Married    Spouse name: Not on file   Number of children: Not on file   Years of education: Not on file   Highest education level: Professional school degree (e.g., MD, DDS, DVM, JD)  Occupational History   Not on file  Tobacco Use   Smoking status: Former    Current packs/day: 0.00    Average packs/day: 1.5 packs/day for 23.0 years (34.5 ttl pk-yrs)    Types: Cigarettes    Start date: 02/22/1972    Quit date: 02/22/1995    Years since quitting: 27.9   Smokeless tobacco: Never  Vaping Use   Vaping status: Never Used  Substance and Sexual Activity   Alcohol use: Yes    Comment: occas   Drug use: No   Sexual activity: Not on file  Other Topics Concern   Not on file  Social History Narrative   Not on file   Social Determinants of Health   Financial Resource Strain: Low Risk  (02/08/2018)   Overall Financial Resource Strain (CARDIA)    Difficulty of Paying Living Expenses: Not hard at all  Food Insecurity: No Food Insecurity (02/08/2018)   Hunger Vital Sign    Worried About Running Out of Food in the Last Year: Never true    Ran Out of Food in the Last Year: Never true  Transportation Needs: No Transportation Needs (02/08/2018)   PRAPARE - Administrator, Civil Service (Medical): No    Lack of Transportation (Non-Medical): No  Physical Activity: Insufficiently Active (02/08/2018)   Exercise Vital Sign    Days of Exercise per Week: 4 days    Minutes of Exercise per Session: 30 min  Stress: Stress Concern Present (02/08/2018)   [employer]  of Occupational Health - Occupational Stress Questionnaire    Feeling of Stress : Rather much  Social Connections: Not on file     Family History: The patient's family history includes AAA (abdominal aortic aneurysm) in his mother; Alzheimer's disease in his mother; Cancer in his paternal grandfather and another family member; Depression in an other family member; Diabetes in his father; Heart disease in his paternal grandfather and another family member; Hyperlipidemia in an other family member; Hypertension in an other family  member; Stroke in an other family member.  ROS:   Please see the history of present illness.   All other systems are reviewed and are negative.   EKGs/Labs/Other Studies Reviewed:    The following studies were reviewed today: Cardiac catheterization 11/20/2017 Ost Ramus to Ramus lesion is 99% stenosed. A drug-eluting stent was successfully placed. Post intervention, there is a 0% residual stenosis. There is mild left ventricular systolic dysfunction. LV end diastolic pressure is moderately elevated. The left ventricular ejection fraction is 45-50% by visual estimate.   Diagnostic Dominance: Co-dominant Intervention  Implants     Permanent Stent  Stent Synergy Des 2.25x16    Echocardiogram 01/25/2018 - Left ventricle: The cavity size was normal. Systolic function was    normal. The estimated ejection fraction was in the range of 50%    to 55%. Wall motion was normal; there were no regional wall    motion abnormalities. The study is indeterminate for the    evaluation of LV diastolic function.  - Mitral valve: There was no significant regurgitation.  - Right ventricle: Systolic function was normal.  - Atrial septum: No defect or patent foramen ovale was identified.  - Tricuspid valve: There was no significant regurgitation.   EKG:   EKG Interpretation Date/Time:  Monday January 16 2023 14:32:56 EST Ventricular Rate:  79 PR Interval:  166 QRS  Duration:  92 QT Interval:  396 QTC Calculation: 454 R Axis:   31  Text Interpretation: Normal sinus rhythm Normal ECG When compared with ECG of 21-Nov-2017 05:58, Criteria for Lateral infarct are no longer Present Nonspecific T wave abnormality now evident in Inferior leads Confirmed by Bing Duffey [phone]) on 01/16/2023 2:54:54 PM        Recent Labs: 12/06/2022: ALT 30; BUN 16; Creatinine, Ser 0.78; Hemoglobin 15.2; Platelets 241.0; Potassium 4.0; Sodium 135  Hemoglobin A1c 6.2% Recent Lipid Panel    Component Value Date/Time   CHOL 104 12/06/2022 1326   CHOL 117 05/13/2021 1114   TRIG 134.0 12/06/2022 1326   HDL 36.30 (L) 12/06/2022 1326   HDL 50 05/13/2021 1114   CHOLHDL 3 12/06/2022 1326   VLDL 26.8 12/06/2022 1326   LDLCALC 41 12/06/2022 1326   LDLCALC 48 05/13/2021 1114   LDLCALC 35 11/22/2019 1117   LDLDIRECT 93.0 12/09/2015 0820   11/24/2020 Cholesterol 146, HDL 46, LDL 73, triglycerides 133  Hemoglobin A1c 6.1%, hemoglobin 14.4, creatinine 0.8, potassium 4.1, ALT 30, TSH 1.23  Physical Exam:    VS:  BP 112/82 (BP Location: Left Arm, Patient Position: Sitting, Cuff Size: Normal)   Pulse 79   Ht 5\' 8"  (1.727 m)   Wt 198 lb (89.8 kg)   BMI 30.11 kg/m     Wt Readings from Last 3 Encounters:  01/16/23 198 lb (89.8 kg)  12/06/22 200 lb (90.7 kg)  11/09/22 190 lb (86.2 kg)      General: Alert, oriented x3, no distress, mildly obese Head: no evidence of trauma, PERRL, EOMI, no exophtalmos or lid lag, no myxedema, no xanthelasma; normal ears, nose and oropharynx Neck: normal jugular venous pulsations and no hepatojugular reflux; brisk carotid pulses without delay and no carotid bruits Chest: clear to auscultation, no signs of consolidation by percussion or palpation, normal fremitus, symmetrical and full respiratory excursions Cardiovascular: normal position and quality of the apical impulse, regular rhythm, normal first and second heart sounds, no murmurs,  rubs or gallops Abdomen: no tenderness or distention, no masses by palpation, no abnormal pulsatility or  arterial bruits, normal bowel sounds, no hepatosplenomegaly Extremities: no clubbing, cyanosis or edema; 2+ radial, ulnar and brachial pulses bilaterally; 2+ right femoral, posterior tibial and dorsalis pedis pulses; 2+ left femoral, posterior tibial and dorsalis pedis pulses; no subclavian or femoral bruits Neurological: grossly nonfocal Psych: Normal mood and affect     ASSESSMENT:    1. Coronary artery disease involving native coronary artery of native heart without angina pectoris   2. Hypercholesterolemia   3. Raynaud's disease without gangrene   4. Essential hypertension   5. Prediabetes       PLAN:    In order of problems listed above:  CAD: He is asymptomatic.  Roughly 5 years have passed since he presented with NSTEMI and received a drug-eluting stent to the ramus intermedius artery.  On aspirin and statin.  Intolerant of beta-blockers due to Raynaud's syndrome.  With history of infarction and mild obesity he would benefit from chronic treatment with GLP-1 agonist for weight loss and to improve cardiovascular prognosis. HLP: LDL at target.  HDL is low and will not improve without substantial weight loss.  Encourage more physical exercise.  Restrict carbohydrates and saturated fat. Raynaud's syndrome: Asymptomatic while taking amlodipine and avoiding beta-blockers. HTN: Excellent control. Overweight/prediabetes: Despite his efforts to improve his diet and despite taking SGLT2 inhibitors he remains mildly obese which will worsen his cardiovascular outcome.  I think he is an excellent candidate for treatment with GLP-1 agonist such as semaglutide, but he is aware that we may have to do with insurance coverage issues.     Medication Adjustments/Labs and Tests Ordered: Current medicines are reviewed at length with the patient today.  Concerns regarding medicines are outlined  above.  Orders Placed This Encounter  Procedures   EKG 12-Lead    No orders of the defined types were placed in this encounter.    Patient Instructions  Medication Instructions:  Our Pharmacist is working on getting this authorized *If you need a refill on your cardiac medications before your next appointment, please call your pharmacy*  Follow-Up: At [facility], you and your health needs are our priority.  As part of our continuing mission to provide you with exceptional heart care, we have created designated Provider Care Teams.  These Care Teams include your primary Cardiologist (physician) and Advanced Practice Providers (APPs -  Physician Assistants and Nurse Practitioners) who all work together to provide you with the care you need, when you need it.  We recommend signing up for the patient portal called "MyChart".  Sign up information is provided on this After Visit Summary.  MyChart is used to connect with patients for Virtual Visits (Telemedicine).  Patients are able to view lab/test results, encounter notes, upcoming appointments, etc.  Non-urgent messages can be sent to your provider as well.   To learn more about what you can do with MyChart, go to [email or web address].    Your next appointment:   1 year(s)  Provider:   Thurmon Fair, MD       Signed, Thurmon Fair, MD  01/23/2023 5:06 PM    [facility]

## 2023-01-17 ENCOUNTER — Encounter: Payer: Self-pay | Admitting: Cardiovascular Disease

## 2023-01-17 ENCOUNTER — Telehealth: Payer: Self-pay | Admitting: Cardiovascular Disease

## 2023-01-17 NOTE — Telephone Encounter (Signed)
Pt c/o medication issue:  1. Name of Medication: Semaglutide-Weight Management (WEGOVY) 0.25 MG/0.5ML SOAJ   2. How are you currently taking this medication (dosage and times per day)? Inject 0.25 mg into the skin once a week.   3. Are you having a reaction (difficulty breathing--STAT)? No  4. What is your medication issue? Pt would like a c/b regarding being switched to another medication being that insurance will not cover above medication. Please advise

## 2023-01-17 NOTE — Telephone Encounter (Signed)
See my chart message

## 2023-01-27 DIAGNOSIS — M799 Soft tissue disorder, unspecified: Secondary | ICD-10-CM | POA: Diagnosis not present

## 2023-01-27 DIAGNOSIS — M2569 Stiffness of other specified joint, not elsewhere classified: Secondary | ICD-10-CM | POA: Diagnosis not present

## 2023-01-27 DIAGNOSIS — M5451 Vertebrogenic low back pain: Secondary | ICD-10-CM | POA: Diagnosis not present

## 2023-02-02 DIAGNOSIS — M2569 Stiffness of other specified joint, not elsewhere classified: Secondary | ICD-10-CM | POA: Diagnosis not present

## 2023-02-02 DIAGNOSIS — M799 Soft tissue disorder, unspecified: Secondary | ICD-10-CM | POA: Diagnosis not present

## 2023-02-02 DIAGNOSIS — M5451 Vertebrogenic low back pain: Secondary | ICD-10-CM | POA: Diagnosis not present

## 2023-02-03 DIAGNOSIS — M2569 Stiffness of other specified joint, not elsewhere classified: Secondary | ICD-10-CM | POA: Diagnosis not present

## 2023-02-03 DIAGNOSIS — M799 Soft tissue disorder, unspecified: Secondary | ICD-10-CM | POA: Diagnosis not present

## 2023-02-03 DIAGNOSIS — M5451 Vertebrogenic low back pain: Secondary | ICD-10-CM | POA: Diagnosis not present

## 2023-02-07 DIAGNOSIS — M799 Soft tissue disorder, unspecified: Secondary | ICD-10-CM | POA: Diagnosis not present

## 2023-02-07 DIAGNOSIS — M2569 Stiffness of other specified joint, not elsewhere classified: Secondary | ICD-10-CM | POA: Diagnosis not present

## 2023-02-07 DIAGNOSIS — M5451 Vertebrogenic low back pain: Secondary | ICD-10-CM | POA: Diagnosis not present

## 2023-02-09 NOTE — Telephone Encounter (Signed)
Results abstracted. Care Team can be updated after patient establishes with new eye care provider.

## 2023-02-13 ENCOUNTER — Other Ambulatory Visit (HOSPITAL_COMMUNITY): Payer: Self-pay

## 2023-02-13 ENCOUNTER — Telehealth: Payer: Self-pay | Admitting: Pharmacy Technician

## 2023-02-13 ENCOUNTER — Telehealth: Payer: Self-pay | Admitting: Cardiovascular Disease

## 2023-02-13 MED ORDER — ROSUVASTATIN CALCIUM 40 MG PO TABS: 40.0000 mg | ORAL_TABLET | Freq: Every day | ORAL | 3 refills | Status: DC

## 2023-02-13 MED ORDER — DAPAGLIFLOZIN PROPANEDIOL 10 MG PO TABS: 10.0000 mg | ORAL_TABLET | Freq: Every day | ORAL | Status: DC

## 2023-02-13 MED ORDER — LISINOPRIL 20 MG PO TABS: 20.0000 mg | ORAL_TABLET | Freq: Every day | ORAL | 3 refills | Status: AC

## 2023-02-13 MED ORDER — AMLODIPINE BESYLATE 5 MG PO TABS: 5.0000 mg | ORAL_TABLET | Freq: Every day | ORAL | 3 refills | Status: DC

## 2023-02-13 MED ORDER — ASPIRIN 81 MG PO TBEC: 81.0000 mg | DELAYED_RELEASE_TABLET | Freq: Every day | ORAL | Status: AC

## 2023-02-13 MED ORDER — LISINOPRIL 20 MG PO TABS: 20.0000 mg | ORAL_TABLET | Freq: Every day | ORAL | 3 refills | Status: DC

## 2023-02-13 NOTE — Telephone Encounter (Signed)
*  STAT* If patient is at the pharmacy, call can be transferred to refill team.   1. Which medications need to be refilled? (please list name of each medication and dose if known) lisinopril (ZESTRIL) 20 MG tablet   2. Which pharmacy/location (including street and city if local pharmacy) is medication to be sent to? [facility] [place], [place] - [address]   3. Do they need a 30 day or 90 day supply? 90  Patient is out of medication

## 2023-02-13 NOTE — Telephone Encounter (Signed)
Pharmacy Patient Advocate Encounter  Received notification from [facility] that Prior Authorization for [facility] 0.25MG /0.5ML auto-injectors has been DENIED.  See denial reason below. No denial letter attached in CMM. Will attach denial letter to Media tab once received.

## 2023-02-14 ENCOUNTER — Other Ambulatory Visit: Payer: Self-pay | Admitting: Cardiovascular Disease

## 2023-02-14 DIAGNOSIS — M2569 Stiffness of other specified joint, not elsewhere classified: Secondary | ICD-10-CM | POA: Diagnosis not present

## 2023-02-14 DIAGNOSIS — M5451 Vertebrogenic low back pain: Secondary | ICD-10-CM | POA: Diagnosis not present

## 2023-02-14 DIAGNOSIS — M799 Soft tissue disorder, unspecified: Secondary | ICD-10-CM | POA: Diagnosis not present

## 2023-02-17 DIAGNOSIS — M2569 Stiffness of other specified joint, not elsewhere classified: Secondary | ICD-10-CM | POA: Diagnosis not present

## 2023-02-17 DIAGNOSIS — M799 Soft tissue disorder, unspecified: Secondary | ICD-10-CM | POA: Diagnosis not present

## 2023-02-17 DIAGNOSIS — M5451 Vertebrogenic low back pain: Secondary | ICD-10-CM | POA: Diagnosis not present

## 2023-02-20 DIAGNOSIS — M799 Soft tissue disorder, unspecified: Secondary | ICD-10-CM | POA: Diagnosis not present

## 2023-02-20 DIAGNOSIS — M2569 Stiffness of other specified joint, not elsewhere classified: Secondary | ICD-10-CM | POA: Diagnosis not present

## 2023-02-20 DIAGNOSIS — M5451 Vertebrogenic low back pain: Secondary | ICD-10-CM | POA: Diagnosis not present

## 2023-02-21 DIAGNOSIS — M799 Soft tissue disorder, unspecified: Secondary | ICD-10-CM | POA: Diagnosis not present

## 2023-02-21 DIAGNOSIS — M5451 Vertebrogenic low back pain: Secondary | ICD-10-CM | POA: Diagnosis not present

## 2023-02-21 DIAGNOSIS — M2569 Stiffness of other specified joint, not elsewhere classified: Secondary | ICD-10-CM | POA: Diagnosis not present

## 2023-02-24 DIAGNOSIS — M2569 Stiffness of other specified joint, not elsewhere classified: Secondary | ICD-10-CM | POA: Diagnosis not present

## 2023-02-24 DIAGNOSIS — M799 Soft tissue disorder, unspecified: Secondary | ICD-10-CM | POA: Diagnosis not present

## 2023-02-24 DIAGNOSIS — M5451 Vertebrogenic low back pain: Secondary | ICD-10-CM | POA: Diagnosis not present

## 2023-03-23 DIAGNOSIS — M2569 Stiffness of other specified joint, not elsewhere classified: Secondary | ICD-10-CM | POA: Diagnosis not present

## 2023-03-23 DIAGNOSIS — M5451 Vertebrogenic low back pain: Secondary | ICD-10-CM | POA: Diagnosis not present

## 2023-03-23 DIAGNOSIS — M799 Soft tissue disorder, unspecified: Secondary | ICD-10-CM | POA: Diagnosis not present

## 2023-03-27 DIAGNOSIS — M2569 Stiffness of other specified joint, not elsewhere classified: Secondary | ICD-10-CM | POA: Diagnosis not present

## 2023-03-27 DIAGNOSIS — M5451 Vertebrogenic low back pain: Secondary | ICD-10-CM | POA: Diagnosis not present

## 2023-03-27 DIAGNOSIS — M799 Soft tissue disorder, unspecified: Secondary | ICD-10-CM | POA: Diagnosis not present

## 2023-03-28 DIAGNOSIS — F900 Attention-deficit hyperactivity disorder, predominantly inattentive type: Secondary | ICD-10-CM | POA: Diagnosis not present

## 2023-04-03 DIAGNOSIS — M799 Soft tissue disorder, unspecified: Secondary | ICD-10-CM | POA: Diagnosis not present

## 2023-04-03 DIAGNOSIS — M5451 Vertebrogenic low back pain: Secondary | ICD-10-CM | POA: Diagnosis not present

## 2023-04-03 DIAGNOSIS — M2569 Stiffness of other specified joint, not elsewhere classified: Secondary | ICD-10-CM | POA: Diagnosis not present

## 2023-04-14 ENCOUNTER — Ambulatory Visit: Payer: BC Managed Care – PPO | Admitting: Cardiovascular Disease

## 2023-04-14 DIAGNOSIS — M5451 Vertebrogenic low back pain: Secondary | ICD-10-CM | POA: Diagnosis not present

## 2023-04-14 DIAGNOSIS — M2569 Stiffness of other specified joint, not elsewhere classified: Secondary | ICD-10-CM | POA: Diagnosis not present

## 2023-04-14 DIAGNOSIS — M799 Soft tissue disorder, unspecified: Secondary | ICD-10-CM | POA: Diagnosis not present

## 2023-04-18 DIAGNOSIS — F411 Generalized anxiety disorder: Secondary | ICD-10-CM | POA: Diagnosis not present

## 2023-04-19 DIAGNOSIS — M2569 Stiffness of other specified joint, not elsewhere classified: Secondary | ICD-10-CM | POA: Diagnosis not present

## 2023-04-19 DIAGNOSIS — M5451 Vertebrogenic low back pain: Secondary | ICD-10-CM | POA: Diagnosis not present

## 2023-04-19 DIAGNOSIS — M799 Soft tissue disorder, unspecified: Secondary | ICD-10-CM | POA: Diagnosis not present

## 2023-04-24 DIAGNOSIS — M5451 Vertebrogenic low back pain: Secondary | ICD-10-CM | POA: Diagnosis not present

## 2023-04-24 DIAGNOSIS — M799 Soft tissue disorder, unspecified: Secondary | ICD-10-CM | POA: Diagnosis not present

## 2023-04-24 DIAGNOSIS — M2569 Stiffness of other specified joint, not elsewhere classified: Secondary | ICD-10-CM | POA: Diagnosis not present

## 2023-04-26 DIAGNOSIS — M2569 Stiffness of other specified joint, not elsewhere classified: Secondary | ICD-10-CM | POA: Diagnosis not present

## 2023-04-26 DIAGNOSIS — M799 Soft tissue disorder, unspecified: Secondary | ICD-10-CM | POA: Diagnosis not present

## 2023-04-26 DIAGNOSIS — M5451 Vertebrogenic low back pain: Secondary | ICD-10-CM | POA: Diagnosis not present

## 2023-05-01 DIAGNOSIS — M799 Soft tissue disorder, unspecified: Secondary | ICD-10-CM | POA: Diagnosis not present

## 2023-05-01 DIAGNOSIS — M5451 Vertebrogenic low back pain: Secondary | ICD-10-CM | POA: Diagnosis not present

## 2023-05-01 DIAGNOSIS — M2569 Stiffness of other specified joint, not elsewhere classified: Secondary | ICD-10-CM | POA: Diagnosis not present

## 2023-05-10 DIAGNOSIS — Z7982 Long term (current) use of aspirin: Secondary | ICD-10-CM | POA: Diagnosis not present

## 2023-05-10 DIAGNOSIS — Z79899 Other long term (current) drug therapy: Secondary | ICD-10-CM | POA: Diagnosis not present

## 2023-05-10 DIAGNOSIS — I2510 Atherosclerotic heart disease of native coronary artery without angina pectoris: Secondary | ICD-10-CM | POA: Diagnosis not present

## 2023-05-10 DIAGNOSIS — R972 Elevated prostate specific antigen [PSA]: Secondary | ICD-10-CM | POA: Diagnosis not present

## 2023-05-10 DIAGNOSIS — I10 Essential (primary) hypertension: Secondary | ICD-10-CM | POA: Diagnosis not present

## 2023-05-10 DIAGNOSIS — N4289 Other specified disorders of prostate: Secondary | ICD-10-CM | POA: Diagnosis not present

## 2023-05-10 DIAGNOSIS — N411 Chronic prostatitis: Secondary | ICD-10-CM | POA: Diagnosis not present

## 2023-05-10 DIAGNOSIS — I7300 Raynaud's syndrome without gangrene: Secondary | ICD-10-CM | POA: Diagnosis not present

## 2023-05-10 DIAGNOSIS — N410 Acute prostatitis: Secondary | ICD-10-CM | POA: Diagnosis not present

## 2023-05-16 DIAGNOSIS — Z48816 Encounter for surgical aftercare following surgery on the genitourinary system: Secondary | ICD-10-CM | POA: Diagnosis not present

## 2023-05-16 DIAGNOSIS — R351 Nocturia: Secondary | ICD-10-CM | POA: Diagnosis not present

## 2023-05-16 DIAGNOSIS — R3915 Urgency of urination: Secondary | ICD-10-CM | POA: Diagnosis not present

## 2023-05-16 DIAGNOSIS — R350 Frequency of micturition: Secondary | ICD-10-CM | POA: Diagnosis not present

## 2023-05-17 DIAGNOSIS — J3089 Other allergic rhinitis: Secondary | ICD-10-CM | POA: Diagnosis not present

## 2023-05-17 DIAGNOSIS — J3081 Allergic rhinitis due to animal (cat) (dog) hair and dander: Secondary | ICD-10-CM | POA: Diagnosis not present

## 2023-05-17 DIAGNOSIS — T63441D Toxic effect of venom of bees, accidental (unintentional), subsequent encounter: Secondary | ICD-10-CM | POA: Diagnosis not present

## 2023-05-17 DIAGNOSIS — J301 Allergic rhinitis due to pollen: Secondary | ICD-10-CM | POA: Diagnosis not present

## 2023-05-19 ENCOUNTER — Other Ambulatory Visit: Payer: Self-pay | Admitting: Cardiovascular Disease

## 2023-06-07 ENCOUNTER — Ambulatory Visit (INDEPENDENT_AMBULATORY_CARE_PROVIDER_SITE_OTHER): Admitting: Sports Medicine

## 2023-06-07 VITALS — BP 128/86 | Ht 68.0 in | Wt 184.0 lb

## 2023-06-07 DIAGNOSIS — M25551 Pain in right hip: Secondary | ICD-10-CM

## 2023-06-07 DIAGNOSIS — M4316 Spondylolisthesis, lumbar region: Secondary | ICD-10-CM | POA: Diagnosis not present

## 2023-06-07 DIAGNOSIS — M5416 Radiculopathy, lumbar region: Secondary | ICD-10-CM | POA: Diagnosis not present

## 2023-06-07 MED ORDER — PREDNISONE 10 MG PO TABS: ORAL_TABLET | ORAL | 0 refills | Status: DC

## 2023-06-07 NOTE — Progress Notes (Signed)
 Subjective:   HPI: Patient is a 63 y.o. male presents with right-sided back pain that began approximately one week ago after lifting an empty 40 lb water tank. Of note, patient has history of chronic low back pain. He describes the pain as similar to previous episodes he has experienced. The discomfort has been gradually improving over the past week, and he has been performing back exercises at home to manage symptoms. He has a history of intermittent physical therapy over the past six months, with 7-10 sessions completed this year, though continuation has been limited by insurance issues. He denies any numbness or tingling, recent trauma, or new injury. He is not currently taking any medications for the pain.   Past Medical History:  Diagnosis Date   Allergy    seasonal    Coronary artery disease    Depression    situational    Heart murmur    hx of in childhood    Hyperlipidemia    Hypertension    Injury of tendon of long head of right biceps    January 2016    Current Outpatient Medications on File Prior to Visit  Medication Sig Dispense Refill   amLODipine (NORVASC) 5 MG tablet Take 1 tablet (5 mg total) by mouth daily. 90 tablet 3   aspirin EC 81 MG tablet Take 1 tablet (81 mg total) by mouth daily.     cetirizine (ZYRTEC) 10 MG tablet Take 10 mg by mouth 2 (two) times daily as needed.      clonazePAM (KLONOPIN) 0.5 MG tablet Take 0.5-1 mg by mouth See admin instructions. Take 2 tablets in the morning and 1 tablet in the evening. May take 1 additional tablet as needed for anxiety     dapagliflozin propanediol (FARXIGA) 10 MG TABS tablet Take 1 tablet (10 mg total) by mouth daily.     dapagliflozin propanediol (FARXIGA) 10 MG TABS tablet Take 1 tablet (10 mg total) by mouth daily before breakfast. 90 tablet 0   ezetimibe (ZETIA) 10 MG tablet Take 1 tablet (10 mg total) by mouth daily. 90 tablet 3   lisdexamfetamine (VYVANSE) 30 MG capsule Take 30 mg by mouth daily in the  afternoon.     lisinopril (ZESTRIL) 20 MG tablet Take 1 tablet (20 mg total) by mouth daily. 90 tablet 3   nitroGLYCERIN (NITROSTAT) 0.4 MG SL tablet ONE TAB UNDER TONGUE AT ONSET OF CHEST PAIN. MAY REPEAT IN 5 MIN-MAX 3 DOSES. THEN CALL 911. 25 tablet 1   rosuvastatin (CRESTOR) 40 MG tablet Take 1 tablet (40 mg total) by mouth daily. 90 tablet 3   Semaglutide-Weight Management (WEGOVY) 0.25 MG/0.5ML SOAJ Inject 0.25 mg into the skin once a week. 2 mL 0   tamsulosin (FLOMAX) 0.4 MG CAPS capsule Take 1 capsule (0.4 mg total) by mouth daily. 90 capsule 3   No current facility-administered medications on file prior to visit.    Past Surgical History:  Procedure Laterality Date   APPENDECTOMY     CARDIAC CATHETERIZATION     COLONOSCOPY N/A 11/28/2014   Procedure: COLONOSCOPY;  Surgeon: Louis Meckel, MD;  [facility]: [facility];  Service: Endoscopy;  Laterality: N/A;   colonscopy      removed polyps   CORONARY STENT INTERVENTION N/A 11/20/2017   Procedure: CORONARY STENT INTERVENTION;  Surgeon: Runell Gess, MD;  [facility];  Service: Cardiovascular;  Laterality: N/A;   INGUINAL HERNIA REPAIR Left 06/12/2014   Procedure: LEFT INGUINAL HERNIA  REPAIR WITH MESH;  Surgeon: Oralee Billow, MD;  Location: [facility];  Service: General;  Laterality: Left;   INSERTION OF MESH Left 06/12/2014   Procedure: INSERTION OF MESH;  Surgeon: Oralee Billow, MD;  Location: [facility];  Service: General;  Laterality: Left;   LEFT HEART CATH AND CORONARY ANGIOGRAPHY N/A 11/20/2017   Procedure: LEFT HEART CATH AND CORONARY ANGIOGRAPHY;  Surgeon: Avanell Leigh, MD;  [facility];  Service: Cardiovascular;  Laterality: N/A;   MOUTH SURGERY     wisdom teeth removed;root canal - 03/2014   TONSILLECTOMY  1995   TONSILLECTOMY      Allergies  Allergen Reactions   Hornet Venom Anaphylaxis, Itching, Other (See Comments) and Swelling   Wasp Venom Anaphylaxis, Anxiety, Itching and Swelling    Yellow Jacket Venom Anaphylaxis, Anxiety, Other (See Comments) and Swelling   Poison Ivy Extract [Poison Ivy Extract] Itching and Rash   Poison Oak Extract [Poison Oak Extract] Itching and Rash    BP 128/86   Ht 5\' 8"  (1.727 m)   Wt 184 lb (83.5 kg)   BMI 27.98 kg/m       No data to display              No data to display              Objective:  Physical Exam:  Gen: NAD, comfortable in exam room  MSK:   Right low back pain- no bruising, swelling, or erythema. TTP at right low back pain and trochanteric bursa. ROM limited on extension to 30 degrees. Better mobility on left side. Strength 5/5. Flexion of back with no issues. FABER and FADIR negative. Straight leg test negative   Neuro: sensation and motor function intact   Assessment & Plan:  1. Acute on chronic low back pain with trochanteric bursitis    Plan:  - Add exercises that target hip abductors and stabilization. Continue other home exercises.   - Try heating pad to relieve pain  - Consider 6 day course of prednisone

## 2023-06-29 DIAGNOSIS — N138 Other obstructive and reflux uropathy: Secondary | ICD-10-CM | POA: Diagnosis not present

## 2023-06-29 DIAGNOSIS — N401 Enlarged prostate with lower urinary tract symptoms: Secondary | ICD-10-CM | POA: Diagnosis not present

## 2023-06-29 DIAGNOSIS — F900 Attention-deficit hyperactivity disorder, predominantly inattentive type: Secondary | ICD-10-CM | POA: Diagnosis not present

## 2023-06-29 DIAGNOSIS — D075 Carcinoma in situ of prostate: Secondary | ICD-10-CM | POA: Diagnosis not present

## 2023-06-29 DIAGNOSIS — R972 Elevated prostate specific antigen [PSA]: Secondary | ICD-10-CM | POA: Diagnosis not present

## 2023-07-03 DIAGNOSIS — D485 Neoplasm of uncertain behavior of skin: Secondary | ICD-10-CM | POA: Diagnosis not present

## 2023-07-03 DIAGNOSIS — D225 Melanocytic nevi of trunk: Secondary | ICD-10-CM | POA: Diagnosis not present

## 2023-07-22 ENCOUNTER — Other Ambulatory Visit: Payer: Self-pay | Admitting: Cardiovascular Disease

## 2023-08-21 ENCOUNTER — Other Ambulatory Visit: Payer: Self-pay | Admitting: Cardiovascular Disease

## 2023-08-21 ENCOUNTER — Telehealth: Payer: Self-pay | Admitting: Cardiovascular Disease

## 2023-08-21 NOTE — Telephone Encounter (Signed)
*  STAT* If patient is at the pharmacy, call can be transferred to refill team.   1. Which medications need to be refilled? (please list name of each medication and dose if known)   dapagliflozin  propanediol (FARXIGA ) 10 MG TABS tablet   2. Would you like to learn more about the convenience, safety, & potential cost savings by using the [facility]?   3. Are you open to using the [facility] (Type [facility]. ).  4. Which pharmacy/location (including street and city if local pharmacy) is medication to be sent to?  [facility] [place], [place] - [address]   5. Do they need a 30 day or 90 day supply?  90 day  Patient stated he is completely out of this medication.  Patient has appointment scheduled on 12/2 with Dr. Francyne.

## 2023-08-22 ENCOUNTER — Other Ambulatory Visit: Payer: Self-pay | Admitting: Cardiovascular Disease

## 2023-08-22 DIAGNOSIS — M5451 Vertebrogenic low back pain: Secondary | ICD-10-CM | POA: Diagnosis not present

## 2023-08-22 DIAGNOSIS — M6281 Muscle weakness (generalized): Secondary | ICD-10-CM | POA: Diagnosis not present

## 2023-08-22 DIAGNOSIS — M799 Soft tissue disorder, unspecified: Secondary | ICD-10-CM | POA: Diagnosis not present

## 2023-08-22 DIAGNOSIS — M2569 Stiffness of other specified joint, not elsewhere classified: Secondary | ICD-10-CM | POA: Diagnosis not present

## 2023-08-22 MED ORDER — DAPAGLIFLOZIN PROPANEDIOL 10 MG PO TABS
10.0000 mg | ORAL_TABLET | Freq: Every day | ORAL | 1 refills | Status: AC
Start: 2023-08-22 — End: ?

## 2023-08-22 NOTE — Telephone Encounter (Signed)
 Filled for 6 months until appointment

## 2023-08-23 DIAGNOSIS — M6281 Muscle weakness (generalized): Secondary | ICD-10-CM | POA: Diagnosis not present

## 2023-08-23 DIAGNOSIS — M5451 Vertebrogenic low back pain: Secondary | ICD-10-CM | POA: Diagnosis not present

## 2023-08-23 DIAGNOSIS — M2569 Stiffness of other specified joint, not elsewhere classified: Secondary | ICD-10-CM | POA: Diagnosis not present

## 2023-08-23 DIAGNOSIS — M799 Soft tissue disorder, unspecified: Secondary | ICD-10-CM | POA: Diagnosis not present

## 2023-08-28 ENCOUNTER — Telehealth: Payer: Self-pay | Admitting: *Deleted

## 2023-08-28 DIAGNOSIS — M6281 Muscle weakness (generalized): Secondary | ICD-10-CM | POA: Diagnosis not present

## 2023-08-28 DIAGNOSIS — F411 Generalized anxiety disorder: Secondary | ICD-10-CM | POA: Diagnosis not present

## 2023-08-28 DIAGNOSIS — M799 Soft tissue disorder, unspecified: Secondary | ICD-10-CM | POA: Diagnosis not present

## 2023-08-28 DIAGNOSIS — J301 Allergic rhinitis due to pollen: Secondary | ICD-10-CM | POA: Diagnosis not present

## 2023-08-28 DIAGNOSIS — M2569 Stiffness of other specified joint, not elsewhere classified: Secondary | ICD-10-CM | POA: Diagnosis not present

## 2023-08-28 DIAGNOSIS — T63441D Toxic effect of venom of bees, accidental (unintentional), subsequent encounter: Secondary | ICD-10-CM | POA: Diagnosis not present

## 2023-08-28 DIAGNOSIS — J3081 Allergic rhinitis due to animal (cat) (dog) hair and dander: Secondary | ICD-10-CM | POA: Diagnosis not present

## 2023-08-28 DIAGNOSIS — M5451 Vertebrogenic low back pain: Secondary | ICD-10-CM | POA: Diagnosis not present

## 2023-08-28 DIAGNOSIS — J3089 Other allergic rhinitis: Secondary | ICD-10-CM | POA: Diagnosis not present

## 2023-08-28 NOTE — Telephone Encounter (Signed)
-----   Message from Alisa DEL sent at 08/28/2023  8:36 AM EDT ----- Regarding: Pt cld to request addt'l PT sessions Pt came by office says Ins Co only approved certain # of sessions but he needs more, pls see if provider can get addt'l sessions approved L deltoid not improved.

## 2023-08-28 NOTE — Telephone Encounter (Signed)
 [facility] PT referral sheet faxed to [facility]. Location to extend PT for patient's lumbar radiculopathy.

## 2023-08-30 DIAGNOSIS — M5451 Vertebrogenic low back pain: Secondary | ICD-10-CM | POA: Diagnosis not present

## 2023-08-30 DIAGNOSIS — M2569 Stiffness of other specified joint, not elsewhere classified: Secondary | ICD-10-CM | POA: Diagnosis not present

## 2023-08-30 DIAGNOSIS — M6281 Muscle weakness (generalized): Secondary | ICD-10-CM | POA: Diagnosis not present

## 2023-08-30 DIAGNOSIS — M799 Soft tissue disorder, unspecified: Secondary | ICD-10-CM | POA: Diagnosis not present

## 2023-09-06 ENCOUNTER — Ambulatory Visit: Admitting: Family Medicine

## 2023-09-07 DIAGNOSIS — M5451 Vertebrogenic low back pain: Secondary | ICD-10-CM | POA: Diagnosis not present

## 2023-09-07 DIAGNOSIS — M6281 Muscle weakness (generalized): Secondary | ICD-10-CM | POA: Diagnosis not present

## 2023-09-07 DIAGNOSIS — M799 Soft tissue disorder, unspecified: Secondary | ICD-10-CM | POA: Diagnosis not present

## 2023-09-07 DIAGNOSIS — M2569 Stiffness of other specified joint, not elsewhere classified: Secondary | ICD-10-CM | POA: Diagnosis not present

## 2023-09-11 ENCOUNTER — Other Ambulatory Visit: Payer: Self-pay

## 2023-09-11 ENCOUNTER — Ambulatory Visit (INDEPENDENT_AMBULATORY_CARE_PROVIDER_SITE_OTHER): Admitting: Family Medicine

## 2023-09-11 VITALS — BP 124/84 | Ht 68.0 in | Wt 180.0 lb

## 2023-09-11 DIAGNOSIS — M25512 Pain in left shoulder: Secondary | ICD-10-CM | POA: Diagnosis not present

## 2023-09-11 DIAGNOSIS — G8929 Other chronic pain: Secondary | ICD-10-CM

## 2023-09-11 NOTE — Patient Instructions (Signed)
 Your ultrasound is reassuring. You do have rotator cuff tendinopathy/strain but no visible acute tears. Deltoid looks ok superficial to this. Start rehab specifically for this - we'll send a referral to add this on to the rehab for your back. Tylenol  as needed as you have been. Try to avoid overhead motions, lifting with extended arm as much as possible. Follow up with me in 1 month.

## 2023-09-12 ENCOUNTER — Encounter: Payer: Self-pay | Admitting: Family Medicine

## 2023-09-12 NOTE — Progress Notes (Signed)
 PCP: Micheal Wolm ORN, MD  Subjective:   HPI: Patient is a 63 y.o. male here for left shoulder pain.  Patient reports he started to get pain in left shoulder 3 weeks ago. No acute injury or trauma. Has been bothering more with extension like when holding the steering wheel. Tried tylenol . He is going to physical therapy for his back with history of a spondy though schedule has been an issue (theirs and his).  Past Medical History:  Diagnosis Date   Allergy    seasonal    Coronary artery disease    Depression    situational    Heart murmur    hx of in childhood    Hyperlipidemia    Hypertension    Injury of tendon of long head of right biceps    January 2016    Current Outpatient Medications on File Prior to Visit  Medication Sig Dispense Refill   amLODipine  (NORVASC ) 5 MG tablet Take 1 tablet (5 mg total) by mouth daily. 90 tablet 3   aspirin  EC 81 MG tablet Take 1 tablet (81 mg total) by mouth daily.     cetirizine (ZYRTEC) 10 MG tablet Take 10 mg by mouth 2 (two) times daily as needed.      clonazePAM  (KLONOPIN ) 0.5 MG tablet Take 0.5-1 mg by mouth See admin instructions. Take 2 tablets in the morning and 1 tablet in the evening. May take 1 additional tablet as needed for anxiety     dapagliflozin  propanediol (FARXIGA ) 10 MG TABS tablet Take 1 tablet (10 mg total) by mouth daily. 90 tablet 1   dapagliflozin  propanediol (FARXIGA ) 10 MG TABS tablet Take 1 tablet (10 mg total) by mouth daily before breakfast. 90 tablet 1   ezetimibe  (ZETIA ) 10 MG tablet Take 1 tablet (10 mg total) by mouth daily. 90 tablet 3   lisdexamfetamine (VYVANSE) 30 MG capsule Take 30 mg by mouth daily in the afternoon.     lisinopril  (ZESTRIL ) 20 MG tablet Take 1 tablet (20 mg total) by mouth daily. 90 tablet 3   nitroGLYCERIN  (NITROSTAT ) 0.4 MG SL tablet ONE TAB UNDER TONGUE AT ONSET OF CHEST PAIN. MAY REPEAT IN 5 MIN-MAX 3 DOSES. THEN CALL 911. 25 tablet 1   predniSONE  (DELTASONE ) 10 MG tablet Use  as directed per doctors orders for the next 6 days. 21 tablet 0   rosuvastatin  (CRESTOR ) 40 MG tablet Take 1 tablet (40 mg total) by mouth daily. 90 tablet 3   Semaglutide -Weight Management (WEGOVY ) 0.25 MG/0.5ML SOAJ Inject 0.25 mg into the skin once a week. 2 mL 0   tamsulosin  (FLOMAX ) 0.4 MG CAPS capsule Take 1 capsule (0.4 mg total) by mouth daily. 90 capsule 3   No current facility-administered medications on file prior to visit.    Past Surgical History:  Procedure Laterality Date   APPENDECTOMY     CARDIAC CATHETERIZATION     COLONOSCOPY N/A 11/28/2014   Procedure: COLONOSCOPY;  Surgeon: Lamar JONETTA Aho, MD;  Location: [facility];  Service: Endoscopy;  Laterality: N/A;   colonscopy      removed polyps   CORONARY STENT INTERVENTION N/A 11/20/2017   Procedure: CORONARY STENT INTERVENTION;  Surgeon: Court Dorn PARAS, MD;  Location: [facility];  Service: Cardiovascular;  Laterality: N/A;   INGUINAL HERNIA REPAIR Left 06/12/2014   Procedure: LEFT INGUINAL HERNIA REPAIR WITH MESH;  Surgeon: Krystal Spinner, MD;  Location: [facility];  Service: General;  Laterality: Left;   INSERTION OF MESH Left  06/12/2014   Procedure: INSERTION OF MESH;  Surgeon: Krystal Spinner, MD;  Location: [facility];  Service: General;  Laterality: Left;   LEFT HEART CATH AND CORONARY ANGIOGRAPHY N/A 11/20/2017   Procedure: LEFT HEART CATH AND CORONARY ANGIOGRAPHY;  Surgeon: Court Dorn PARAS, MD;  Location: [facility];  Service: Cardiovascular;  Laterality: N/A;   MOUTH SURGERY     wisdom teeth removed;root canal - 03/2014   TONSILLECTOMY  1995   TONSILLECTOMY      Allergies  Allergen Reactions   Hornet Venom Anaphylaxis, Itching, Other (See Comments) and Swelling   Wasp Venom Anaphylaxis, Anxiety, Itching and Swelling   Yellow Jacket Venom Anaphylaxis, Anxiety, Other (See Comments) and Swelling   Poison Ivy Extract [Poison Ivy Extract] Itching and Rash   Poison Oak Extract [Poison Oak Extract] Itching and  Rash    BP 124/84   Ht 5' 8 (1.727 m)   Wt 180 lb (81.6 kg)   BMI 27.37 kg/m       No data to display              No data to display              Objective:  Physical Exam:  Gen: NAD, comfortable in exam room  Left shoulder: No swelling, ecchymoses.  No gross deformity. No TTP. FROM. Negative Hawkins, Neers. Negative Yergasons. Strength 5/5 with empty can and resisted internal/external rotation.  Pain external rotation Negative apprehension. NV intact distally.  Limited MSK u/s left shoulder: subscapularis appears normal on short and long views.  Infraspinatus normal without visible tear.  Supraspinatus without tear but appears more hypoechoic than expected consistent with tendinopathy.  Overlying deltoid muscle appears normal also.   Assessment & Plan:  1. Left shoulder pain - due to rotator cuff tendinopathy/strain.  Ultrasound reassuring.  Start physical therapy and home exercises.  Tylenol  as needed.  Discussed activities to avoid.  Follow up in 1 month.

## 2023-09-18 DIAGNOSIS — M799 Soft tissue disorder, unspecified: Secondary | ICD-10-CM | POA: Diagnosis not present

## 2023-09-18 DIAGNOSIS — M2569 Stiffness of other specified joint, not elsewhere classified: Secondary | ICD-10-CM | POA: Diagnosis not present

## 2023-09-18 DIAGNOSIS — M5451 Vertebrogenic low back pain: Secondary | ICD-10-CM | POA: Diagnosis not present

## 2023-09-18 DIAGNOSIS — M6281 Muscle weakness (generalized): Secondary | ICD-10-CM | POA: Diagnosis not present

## 2023-09-19 DIAGNOSIS — M5451 Vertebrogenic low back pain: Secondary | ICD-10-CM | POA: Diagnosis not present

## 2023-09-19 DIAGNOSIS — M799 Soft tissue disorder, unspecified: Secondary | ICD-10-CM | POA: Diagnosis not present

## 2023-09-19 DIAGNOSIS — M2569 Stiffness of other specified joint, not elsewhere classified: Secondary | ICD-10-CM | POA: Diagnosis not present

## 2023-09-19 DIAGNOSIS — M6281 Muscle weakness (generalized): Secondary | ICD-10-CM | POA: Diagnosis not present

## 2023-09-22 DIAGNOSIS — M6281 Muscle weakness (generalized): Secondary | ICD-10-CM | POA: Diagnosis not present

## 2023-09-22 DIAGNOSIS — M5451 Vertebrogenic low back pain: Secondary | ICD-10-CM | POA: Diagnosis not present

## 2023-09-22 DIAGNOSIS — M2569 Stiffness of other specified joint, not elsewhere classified: Secondary | ICD-10-CM | POA: Diagnosis not present

## 2023-09-22 DIAGNOSIS — M799 Soft tissue disorder, unspecified: Secondary | ICD-10-CM | POA: Diagnosis not present

## 2023-09-25 DIAGNOSIS — M25612 Stiffness of left shoulder, not elsewhere classified: Secondary | ICD-10-CM | POA: Diagnosis not present

## 2023-09-25 DIAGNOSIS — M799 Soft tissue disorder, unspecified: Secondary | ICD-10-CM | POA: Diagnosis not present

## 2023-09-25 DIAGNOSIS — M6281 Muscle weakness (generalized): Secondary | ICD-10-CM | POA: Diagnosis not present

## 2023-09-25 DIAGNOSIS — M25512 Pain in left shoulder: Secondary | ICD-10-CM | POA: Diagnosis not present

## 2023-09-28 DIAGNOSIS — F900 Attention-deficit hyperactivity disorder, predominantly inattentive type: Secondary | ICD-10-CM | POA: Diagnosis not present

## 2023-09-29 DIAGNOSIS — M799 Soft tissue disorder, unspecified: Secondary | ICD-10-CM | POA: Diagnosis not present

## 2023-09-29 DIAGNOSIS — M6281 Muscle weakness (generalized): Secondary | ICD-10-CM | POA: Diagnosis not present

## 2023-09-29 DIAGNOSIS — M25512 Pain in left shoulder: Secondary | ICD-10-CM | POA: Diagnosis not present

## 2023-09-29 DIAGNOSIS — M25612 Stiffness of left shoulder, not elsewhere classified: Secondary | ICD-10-CM | POA: Diagnosis not present

## 2023-10-02 DIAGNOSIS — M799 Soft tissue disorder, unspecified: Secondary | ICD-10-CM | POA: Diagnosis not present

## 2023-10-02 DIAGNOSIS — M25612 Stiffness of left shoulder, not elsewhere classified: Secondary | ICD-10-CM | POA: Diagnosis not present

## 2023-10-02 DIAGNOSIS — M25512 Pain in left shoulder: Secondary | ICD-10-CM | POA: Diagnosis not present

## 2023-10-02 DIAGNOSIS — M6281 Muscle weakness (generalized): Secondary | ICD-10-CM | POA: Diagnosis not present

## 2023-10-10 DIAGNOSIS — M25512 Pain in left shoulder: Secondary | ICD-10-CM | POA: Diagnosis not present

## 2023-10-10 DIAGNOSIS — M799 Soft tissue disorder, unspecified: Secondary | ICD-10-CM | POA: Diagnosis not present

## 2023-10-10 DIAGNOSIS — M6281 Muscle weakness (generalized): Secondary | ICD-10-CM | POA: Diagnosis not present

## 2023-10-10 DIAGNOSIS — M25612 Stiffness of left shoulder, not elsewhere classified: Secondary | ICD-10-CM | POA: Diagnosis not present

## 2023-10-11 ENCOUNTER — Ambulatory Visit (INDEPENDENT_AMBULATORY_CARE_PROVIDER_SITE_OTHER): Admitting: Family Medicine

## 2023-10-11 VITALS — BP 132/82 | Ht 68.0 in | Wt 180.0 lb

## 2023-10-11 DIAGNOSIS — M7742 Metatarsalgia, left foot: Secondary | ICD-10-CM

## 2023-10-11 MED ORDER — NITROGLYCERIN 0.2 MG/HR TD PT24: MEDICATED_PATCH | TRANSDERMAL | 1 refills | Status: AC

## 2023-10-11 NOTE — Progress Notes (Signed)
 PCP: Micheal Wolm ORN, MD  Subjective:   HPI: Patient is a 63 y.o. male here for follow-up evaluation of left shoulder pain, he was last seen in clinic on 09/11/2023.  Patient reports that his left shoulder pain has moderately improved since his previous visit. He has been doing physical therapy and taking Tylenol  as needed for pain. He does not believe the Tylenol  has been very helpful, however, he believes the physical therapy has been beneficial, especially the dry needling. He recently went on a trip to [place] and had a lot of difficulty with lifting his bag into the overhead compartment due to his left shoulder pain. Patient is right-handed.   Past Medical History:  Diagnosis Date   Allergy    seasonal    Coronary artery disease    Depression    situational    Heart murmur    hx of in childhood    Hyperlipidemia    Hypertension    Injury of tendon of long head of right biceps    January 2016    Current Outpatient Medications on File Prior to Visit  Medication Sig Dispense Refill   amLODipine  (NORVASC ) 5 MG tablet Take 1 tablet (5 mg total) by mouth daily. 90 tablet 3   aspirin  EC 81 MG tablet Take 1 tablet (81 mg total) by mouth daily.     cetirizine (ZYRTEC) 10 MG tablet Take 10 mg by mouth 2 (two) times daily as needed.      clonazePAM  (KLONOPIN ) 0.5 MG tablet Take 0.5-1 mg by mouth See admin instructions. Take 2 tablets in the morning and 1 tablet in the evening. May take 1 additional tablet as needed for anxiety     dapagliflozin  propanediol (FARXIGA ) 10 MG TABS tablet Take 1 tablet (10 mg total) by mouth daily. 90 tablet 1   dapagliflozin  propanediol (FARXIGA ) 10 MG TABS tablet Take 1 tablet (10 mg total) by mouth daily before breakfast. 90 tablet 1   ezetimibe  (ZETIA ) 10 MG tablet Take 1 tablet (10 mg total) by mouth daily. 90 tablet 3   lisdexamfetamine (VYVANSE) 30 MG capsule Take 30 mg by mouth daily in the afternoon.     lisinopril  (ZESTRIL ) 20 MG tablet Take 1  tablet (20 mg total) by mouth daily. 90 tablet 3   nitroGLYCERIN  (NITROSTAT ) 0.4 MG SL tablet ONE TAB UNDER TONGUE AT ONSET OF CHEST PAIN. MAY REPEAT IN 5 MIN-MAX 3 DOSES. THEN CALL 911. 25 tablet 1   predniSONE  (DELTASONE ) 10 MG tablet Use as directed per doctors orders for the next 6 days. 21 tablet 0   rosuvastatin  (CRESTOR ) 40 MG tablet Take 1 tablet (40 mg total) by mouth daily. 90 tablet 3   Semaglutide -Weight Management (WEGOVY ) 0.25 MG/0.5ML SOAJ Inject 0.25 mg into the skin once a week. 2 mL 0   tamsulosin  (FLOMAX ) 0.4 MG CAPS capsule Take 1 capsule (0.4 mg total) by mouth daily. 90 capsule 3   No current facility-administered medications on file prior to visit.    Past Surgical History:  Procedure Laterality Date   APPENDECTOMY     CARDIAC CATHETERIZATION     COLONOSCOPY N/A 11/28/2014   Procedure: COLONOSCOPY;  Surgeon: Lamar JONETTA Aho, MD;  Location: [facility];  Service: Endoscopy;  Laterality: N/A;   colonscopy      removed polyps   CORONARY STENT INTERVENTION N/A 11/20/2017   Procedure: CORONARY STENT INTERVENTION;  Surgeon: Court Dorn PARAS, MD;  [facility]: [facility];  Service: Cardiovascular;  Laterality:  N/A;   INGUINAL HERNIA REPAIR Left 06/12/2014   Procedure: LEFT INGUINAL HERNIA REPAIR WITH MESH;  Surgeon: Krystal Spinner, MD;  Location: [facility];  Service: General;  Laterality: Left;   INSERTION OF MESH Left 06/12/2014   Procedure: INSERTION OF MESH;  Surgeon: Krystal Spinner, MD;  Location: [facility];  Service: General;  Laterality: Left;   LEFT HEART CATH AND CORONARY ANGIOGRAPHY N/A 11/20/2017   Procedure: LEFT HEART CATH AND CORONARY ANGIOGRAPHY;  Surgeon: Court Dorn PARAS, MD;  [facility]: [facility];  Service: Cardiovascular;  Laterality: N/A;   MOUTH SURGERY     wisdom teeth removed;root canal - 03/2014   TONSILLECTOMY  1995   TONSILLECTOMY      Allergies  Allergen Reactions   Hornet Venom Anaphylaxis, Itching, Other (See Comments) and Swelling   Wasp  Venom Anaphylaxis, Anxiety, Itching and Swelling   Yellow Jacket Venom Anaphylaxis, Anxiety, Other (See Comments) and Swelling   Poison Ivy Extract [Poison Ivy Extract] Itching and Rash   Poison Oak Extract [Poison Oak Extract] Itching and Rash    BP 132/82   Ht 5' 8 (1.727 m)   Wt 180 lb (81.6 kg)   BMI 27.37 kg/m       No data to display              No data to display              Objective:  Physical Exam:  Gen: NAD, comfortable in exam room  MSK: Left shoulder Inspection: No bony or soft tissue abnormalities appreciated. Appears symmetrical in comparison with right shoulder.  Palpation: There is tenderness to palpation over the upper portion of the trapezius muscle with associated tightness/prominence and the deltoid. No tenderness to palpation of the collarbone, AC joint, bicipital groove, or posterior shoulder.  ROM: FROM with forward flexion, abduction, and external rotation, though positive painful arc. Internal rotation limited by roughly 10 degrees compared to right shoulder.  Strength: 5/5 with internal/external rotation and abduction. Though patient endorses mild discomfort with resisted abduction.  Neuro/Vasc: Neurovascularly intact distally.  Special Tests: Positive Neer's (though location of discomfort is trapezius muscle), positive Empty can, negative Hawkins    Assessment & Plan:  1. Left Shoulder Pain - Patient's ongoing left shoulder pain appears to be due to a combination of rotator cuff tendinopathy and trapezius muscle spasm. Overall, his symptoms and exam are improved from prior with physical therapy, therefore, encouraged patient to continue with home exercises and formal physical therapy. Discussed that we will also add nitroglycerin  patches to help promote blood flow and healing in his shoulder. Lastly, discussed that steroid injections could be an option in the future if symptoms persist or worsen, however, given his improvement would not  recommend at this time. Advised patient to follow-up in 6 weeks for evaluation of progress.  - Continue PT and home exercises  - 1/4 nitroglycerin  patch to the affected shoulder, change daily  - Follow-up in 6 weeks  Signe Ravel, MS4 [facility]

## 2023-10-11 NOTE — Patient Instructions (Signed)
 Continue physical therapy and home exercises. Add nitroglycerin  patches 1/4th patch to affected shoulder, change daily. Follow up with me in 6 weeks.

## 2023-10-12 DIAGNOSIS — M25612 Stiffness of left shoulder, not elsewhere classified: Secondary | ICD-10-CM | POA: Diagnosis not present

## 2023-10-12 DIAGNOSIS — M25512 Pain in left shoulder: Secondary | ICD-10-CM | POA: Diagnosis not present

## 2023-10-12 DIAGNOSIS — M799 Soft tissue disorder, unspecified: Secondary | ICD-10-CM | POA: Diagnosis not present

## 2023-10-12 DIAGNOSIS — M6281 Muscle weakness (generalized): Secondary | ICD-10-CM | POA: Diagnosis not present

## 2023-10-16 DIAGNOSIS — M799 Soft tissue disorder, unspecified: Secondary | ICD-10-CM | POA: Diagnosis not present

## 2023-10-16 DIAGNOSIS — M6281 Muscle weakness (generalized): Secondary | ICD-10-CM | POA: Diagnosis not present

## 2023-10-16 DIAGNOSIS — M25512 Pain in left shoulder: Secondary | ICD-10-CM | POA: Diagnosis not present

## 2023-10-16 DIAGNOSIS — M25612 Stiffness of left shoulder, not elsewhere classified: Secondary | ICD-10-CM | POA: Diagnosis not present

## 2023-10-24 DIAGNOSIS — M25512 Pain in left shoulder: Secondary | ICD-10-CM | POA: Diagnosis not present

## 2023-10-24 DIAGNOSIS — M799 Soft tissue disorder, unspecified: Secondary | ICD-10-CM | POA: Diagnosis not present

## 2023-10-24 DIAGNOSIS — M25612 Stiffness of left shoulder, not elsewhere classified: Secondary | ICD-10-CM | POA: Diagnosis not present

## 2023-10-24 DIAGNOSIS — M6281 Muscle weakness (generalized): Secondary | ICD-10-CM | POA: Diagnosis not present

## 2023-10-26 DIAGNOSIS — M6281 Muscle weakness (generalized): Secondary | ICD-10-CM | POA: Diagnosis not present

## 2023-10-26 DIAGNOSIS — M799 Soft tissue disorder, unspecified: Secondary | ICD-10-CM | POA: Diagnosis not present

## 2023-10-26 DIAGNOSIS — M25512 Pain in left shoulder: Secondary | ICD-10-CM | POA: Diagnosis not present

## 2023-10-26 DIAGNOSIS — M25612 Stiffness of left shoulder, not elsewhere classified: Secondary | ICD-10-CM | POA: Diagnosis not present

## 2023-11-01 DIAGNOSIS — D485 Neoplasm of uncertain behavior of skin: Secondary | ICD-10-CM | POA: Diagnosis not present

## 2023-11-01 DIAGNOSIS — B078 Other viral warts: Secondary | ICD-10-CM | POA: Diagnosis not present

## 2023-11-01 DIAGNOSIS — L812 Freckles: Secondary | ICD-10-CM | POA: Diagnosis not present

## 2023-11-01 DIAGNOSIS — L821 Other seborrheic keratosis: Secondary | ICD-10-CM | POA: Diagnosis not present

## 2023-11-01 DIAGNOSIS — M25612 Stiffness of left shoulder, not elsewhere classified: Secondary | ICD-10-CM | POA: Diagnosis not present

## 2023-11-01 DIAGNOSIS — M25512 Pain in left shoulder: Secondary | ICD-10-CM | POA: Diagnosis not present

## 2023-11-01 DIAGNOSIS — M799 Soft tissue disorder, unspecified: Secondary | ICD-10-CM | POA: Diagnosis not present

## 2023-11-01 DIAGNOSIS — D2272 Melanocytic nevi of left lower limb, including hip: Secondary | ICD-10-CM | POA: Diagnosis not present

## 2023-11-01 DIAGNOSIS — L858 Other specified epidermal thickening: Secondary | ICD-10-CM | POA: Diagnosis not present

## 2023-11-01 DIAGNOSIS — M6281 Muscle weakness (generalized): Secondary | ICD-10-CM | POA: Diagnosis not present

## 2023-11-03 DIAGNOSIS — M25612 Stiffness of left shoulder, not elsewhere classified: Secondary | ICD-10-CM | POA: Diagnosis not present

## 2023-11-03 DIAGNOSIS — M6281 Muscle weakness (generalized): Secondary | ICD-10-CM | POA: Diagnosis not present

## 2023-11-03 DIAGNOSIS — M25512 Pain in left shoulder: Secondary | ICD-10-CM | POA: Diagnosis not present

## 2023-11-03 DIAGNOSIS — M799 Soft tissue disorder, unspecified: Secondary | ICD-10-CM | POA: Diagnosis not present

## 2023-11-07 DIAGNOSIS — M25512 Pain in left shoulder: Secondary | ICD-10-CM | POA: Diagnosis not present

## 2023-11-07 DIAGNOSIS — M799 Soft tissue disorder, unspecified: Secondary | ICD-10-CM | POA: Diagnosis not present

## 2023-11-07 DIAGNOSIS — M6281 Muscle weakness (generalized): Secondary | ICD-10-CM | POA: Diagnosis not present

## 2023-11-07 DIAGNOSIS — M25612 Stiffness of left shoulder, not elsewhere classified: Secondary | ICD-10-CM | POA: Diagnosis not present

## 2023-11-09 DIAGNOSIS — M25612 Stiffness of left shoulder, not elsewhere classified: Secondary | ICD-10-CM | POA: Diagnosis not present

## 2023-11-09 DIAGNOSIS — M799 Soft tissue disorder, unspecified: Secondary | ICD-10-CM | POA: Diagnosis not present

## 2023-11-09 DIAGNOSIS — M6281 Muscle weakness (generalized): Secondary | ICD-10-CM | POA: Diagnosis not present

## 2023-11-09 DIAGNOSIS — M25512 Pain in left shoulder: Secondary | ICD-10-CM | POA: Diagnosis not present

## 2023-11-22 ENCOUNTER — Ambulatory Visit (INDEPENDENT_AMBULATORY_CARE_PROVIDER_SITE_OTHER): Admitting: Family Medicine

## 2023-11-22 VITALS — BP 122/86 | Ht 69.0 in | Wt 178.0 lb

## 2023-11-22 DIAGNOSIS — G8929 Other chronic pain: Secondary | ICD-10-CM | POA: Diagnosis not present

## 2023-11-22 DIAGNOSIS — M25512 Pain in left shoulder: Secondary | ICD-10-CM

## 2023-11-22 NOTE — Progress Notes (Signed)
 PCP: Micheal Wolm ORN, MD  Subjective:   HPI: Patient is a 63 y.o. male here for left shoulder pain.  8/20: Patient reports that his left shoulder pain has moderately improved since his previous visit. He has been doing physical therapy and taking Tylenol  as needed for pain. He does not believe the Tylenol  has been very helpful, however, he believes the physical therapy has been beneficial, especially the dry needling. He recently went on a trip to [place] and had a lot of difficulty with lifting his bag into the overhead compartment due to his left shoulder pain. Patient is right-handed.   10/1: Patient reports he's doing better than last visit. Taking tylenol  as needed for pain. Has tender area on lateral aspect of left shoulder. Using nitroglycerin  patches without side effects. Not as diligent about his home exercises but is working out regularly including with physical therapist who has done dry needling.  Past Medical History:  Diagnosis Date   Allergy    seasonal    Coronary artery disease    Depression    situational    Heart murmur    hx of in childhood    Hyperlipidemia    Hypertension    Injury of tendon of long head of right biceps    January 2016    Current Outpatient Medications on File Prior to Visit  Medication Sig Dispense Refill   amLODipine  (NORVASC ) 5 MG tablet Take 1 tablet (5 mg total) by mouth daily. 90 tablet 3   aspirin  EC 81 MG tablet Take 1 tablet (81 mg total) by mouth daily.     cetirizine (ZYRTEC) 10 MG tablet Take 10 mg by mouth 2 (two) times daily as needed.      clonazePAM  (KLONOPIN ) 0.5 MG tablet Take 0.5-1 mg by mouth See admin instructions. Take 2 tablets in the morning and 1 tablet in the evening. May take 1 additional tablet as needed for anxiety     dapagliflozin  propanediol (FARXIGA ) 10 MG TABS tablet Take 1 tablet (10 mg total) by mouth daily. 90 tablet 1   dapagliflozin  propanediol (FARXIGA ) 10 MG TABS tablet Take 1 tablet (10 mg  total) by mouth daily before breakfast. 90 tablet 1   ezetimibe  (ZETIA ) 10 MG tablet Take 1 tablet (10 mg total) by mouth daily. 90 tablet 3   lisdexamfetamine (VYVANSE) 30 MG capsule Take 30 mg by mouth daily in the afternoon.     lisinopril  (ZESTRIL ) 20 MG tablet Take 1 tablet (20 mg total) by mouth daily. 90 tablet 3   nitroGLYCERIN  (NITRODUR - DOSED IN MG/24 HR) 0.2 mg/hr patch Apply 1/4th patch to affected shoulder, change daily 30 patch 1   nitroGLYCERIN  (NITROSTAT ) 0.4 MG SL tablet ONE TAB UNDER TONGUE AT ONSET OF CHEST PAIN. MAY REPEAT IN 5 MIN-MAX 3 DOSES. THEN CALL 911. 25 tablet 1   predniSONE  (DELTASONE ) 10 MG tablet Use as directed per doctors orders for the next 6 days. 21 tablet 0   rosuvastatin  (CRESTOR ) 40 MG tablet Take 1 tablet (40 mg total) by mouth daily. 90 tablet 3   Semaglutide -Weight Management (WEGOVY ) 0.25 MG/0.5ML SOAJ Inject 0.25 mg into the skin once a week. 2 mL 0   tamsulosin  (FLOMAX ) 0.4 MG CAPS capsule Take 1 capsule (0.4 mg total) by mouth daily. 90 capsule 3   No current facility-administered medications on file prior to visit.    Past Surgical History:  Procedure Laterality Date   APPENDECTOMY     CARDIAC CATHETERIZATION  COLONOSCOPY N/A 11/28/2014   Procedure: COLONOSCOPY;  Surgeon: Lamar JONETTA Aho, MD;  Location: [facility];  Service: Endoscopy;  Laterality: N/A;   colonscopy      removed polyps   CORONARY STENT INTERVENTION N/A 11/20/2017   Procedure: CORONARY STENT INTERVENTION;  Surgeon: Court Dorn PARAS, MD;  Location: [facility];  Service: Cardiovascular;  Laterality: N/A;   INGUINAL HERNIA REPAIR Left 06/12/2014   Procedure: LEFT INGUINAL HERNIA REPAIR WITH MESH;  Surgeon: Krystal Spinner, MD;  Location: [facility];  Service: General;  Laterality: Left;   INSERTION OF MESH Left 06/12/2014   Procedure: INSERTION OF MESH;  Surgeon: Krystal Spinner, MD;  Location: [facility];  Service: General;  Laterality: Left;   LEFT HEART CATH AND CORONARY  ANGIOGRAPHY N/A 11/20/2017   Procedure: LEFT HEART CATH AND CORONARY ANGIOGRAPHY;  Surgeon: Court Dorn PARAS, MD;  Location: [facility];  Service: Cardiovascular;  Laterality: N/A;   MOUTH SURGERY     wisdom teeth removed;root canal - 03/2014   TONSILLECTOMY  1995   TONSILLECTOMY      Allergies  Allergen Reactions   Hornet Venom Anaphylaxis, Itching, Other (See Comments) and Swelling   Wasp Venom Anaphylaxis, Anxiety, Itching and Swelling   Yellow Jacket Venom Anaphylaxis, Anxiety, Other (See Comments) and Swelling   Poison Ivy Extract [Poison Ivy Extract] Itching and Rash   Poison Oak Extract [Poison Oak Extract] Itching and Rash    BP 122/86   Ht 5' 9 (1.753 m)   Wt 178 lb (80.7 kg)   BMI 26.29 kg/m       No data to display              No data to display              Objective:  Physical Exam:  Gen: NAD, comfortable in exam room  Left shoulder: No swelling, ecchymoses.  No gross deformity. TTP anterior head of deltoid.  No other tenderness. FROM. Negative Hawkins, Neers. Negative Yergasons. Strength 5/5 with empty can and resisted internal/external rotation. NV intact distally.   Assessment & Plan:  1. Left shoulder pain - much improved from rotator cuff impingement and trapezius strain/spasms.  Continue nitroglycerin  patches for 6 more weeks as well as home exercises.  Tylenol  if needed.  Follow up as needed.

## 2023-11-22 NOTE — Patient Instructions (Signed)
 Continue the nitroglycerin  patches for 6 more weeks. Do the home exercises about 3 days a week for 4-6 more weeks. I'm glad you're doing better! Call us  if you need anything otherwise follow up as needed.

## 2023-12-08 ENCOUNTER — Ambulatory Visit (INDEPENDENT_AMBULATORY_CARE_PROVIDER_SITE_OTHER): Payer: BC Managed Care – PPO | Admitting: Family Medicine

## 2023-12-08 VITALS — BP 122/70 | HR 76 | Temp 97.6°F | Ht 66.54 in | Wt 181.6 lb

## 2023-12-08 DIAGNOSIS — Z13220 Encounter for screening for lipoid disorders: Secondary | ICD-10-CM

## 2023-12-08 DIAGNOSIS — Z23 Encounter for immunization: Secondary | ICD-10-CM

## 2023-12-08 DIAGNOSIS — R7303 Prediabetes: Secondary | ICD-10-CM | POA: Diagnosis not present

## 2023-12-08 DIAGNOSIS — R310 Gross hematuria: Secondary | ICD-10-CM | POA: Diagnosis not present

## 2023-12-08 DIAGNOSIS — Z0000 Encounter for general adult medical examination without abnormal findings: Secondary | ICD-10-CM | POA: Diagnosis not present

## 2023-12-08 LAB — LIPID PANEL
Cholesterol: 105 mg/dL (ref 0–200)
HDL: 45.2 mg/dL (ref >39.00)
LDL Cholesterol: 46 mg/dL (ref 0–99)
NonHDL: 59.46
Total CHOL/HDL Ratio: 2
Triglycerides: 68 mg/dL (ref 0.0–149.0)
VLDL: 13.6 mg/dL (ref 0.0–40.0)

## 2023-12-08 LAB — CBC WITH DIFFERENTIAL/PLATELET
Basophils Absolute: 0.1 K/uL (ref 0.0–0.1)
Basophils Relative: 0.6 % (ref 0.0–3.0)
Eosinophils Absolute: 0.1 K/uL (ref 0.0–0.7)
Eosinophils Relative: 1.4 % (ref 0.0–5.0)
HCT: 47.4 % (ref 39.0–52.0)
Hemoglobin: 15.5 g/dL (ref 13.0–17.0)
Lymphocytes Relative: 21.7 % (ref 12.0–46.0)
Lymphs Abs: 1.9 K/uL (ref 0.7–4.0)
MCHC: 32.7 g/dL (ref 30.0–36.0)
MCV: 85.6 fl (ref 78.0–100.0)
Monocytes Absolute: 0.9 K/uL (ref 0.1–1.0)
Monocytes Relative: 10.5 % (ref 3.0–12.0)
Neutro Abs: 5.8 K/uL (ref 1.4–7.7)
Neutrophils Relative %: 65.8 % (ref 43.0–77.0)
Platelets: 216 K/uL (ref 150.0–400.0)
RBC: 5.54 Mil/uL (ref 4.22–5.81)
RDW: 13.6 % (ref 11.5–15.5)
WBC: 8.9 K/uL (ref 4.0–10.5)

## 2023-12-08 LAB — BASIC METABOLIC PANEL WITH GFR
BUN: 20 mg/dL (ref 6–23)
CO2: 26 meq/L (ref 19–32)
Calcium: 9.1 mg/dL (ref 8.4–10.5)
Chloride: 103 meq/L (ref 96–112)
Creatinine, Ser: 0.85 mg/dL (ref 0.40–1.50)
GFR: 91.97 mL/min (ref >60.00)
Glucose, Bld: 85 mg/dL (ref 70–99)
Potassium: 4.5 meq/L (ref 3.5–5.1)
Sodium: 136 meq/L (ref 135–145)

## 2023-12-08 LAB — HEPATIC FUNCTION PANEL
ALT: 20 U/L (ref 0–53)
AST: 20 U/L (ref 0–37)
Albumin: 4.6 g/dL (ref 3.5–5.2)
Alkaline Phosphatase: 72 U/L (ref 39–117)
Bilirubin, Direct: 0.1 mg/dL (ref 0.0–0.3)
Total Bilirubin: 0.4 mg/dL (ref 0.2–1.2)
Total Protein: 7.4 g/dL (ref 6.0–8.3)

## 2023-12-08 LAB — HEMOGLOBIN A1C: Hgb A1c MFr Bld: 6.4 % (ref 4.6–6.5)

## 2023-12-08 NOTE — Patient Instructions (Signed)
 Give me the name of neurologist in [place] area if still interested in seeing them.

## 2023-12-08 NOTE — Progress Notes (Signed)
 Established Patient Office Visit  Subjective   Patient ID: Damon [name], male    DOB: Jun 26, 1960  Age: 63 y.o. MRN: [id number]  Chief Complaint  Patient presents with   Annual Exam    HPI   Damon [name] is here for annual physical exam-and for separate issue as below.    he has history of CAD with past history of NSTEMI, hypertension, hyperglycemia, Raynaud's, dyslipidemia.  He had some occasional paresthesias involving feet.  No total numbness.  No significant pain at rest.  He does relate recent episode of little bit of gross hematuria.  No pain with urination.  Followed by urologist.  No history of kidney stones.  No flank pain.  No reported appetite changes.  He has lost some weight due to his efforts.  History of elevated PSA with reported normal biopsy.  No recent instrumentation of bladder.  Health maintenance reviewed:  Health Maintenance  Topic Date Due   Diabetic kidney evaluation - Urine ACR  Never done   COVID-19 Vaccine (3 - Pfizer risk series) 06/11/2019   Pneumococcal Vaccine: 50+ Years (2 of 2 - PCV) 11/14/2019   HEMOGLOBIN A1C  06/06/2023   OPHTHALMOLOGY EXAM  08/03/2023   Diabetic kidney evaluation - eGFR measurement  12/06/2023   Colonoscopy  11/27/2024   FOOT EXAM  12/07/2024   DTaP/Tdap/Td (3 - Td or Tdap) 11/21/2029   Influenza Vaccine  Completed   Hepatitis C Screening  Completed   HIV Screening  Completed   Zoster Vaccines- Shingrix   Completed   Hepatitis B Vaccines 19-59 Average Risk  Aged Out   HPV VACCINES  Aged Out   Meningococcal B Vaccine  Aged Out   - Colonoscopy due next year.  Does need flu vaccine. -Has completed Shingrix  and has also had previous pneumonia vaccination.  Social history-quit smoking 1997.  Occasional alcohol.  Helps care for his elderly father.  Family history-father is 39 and may have some dementia.  Mom died of Alzheimer's complications.  2 sisters.  No active medical problems.   Past Medical History:  Diagnosis Date    Allergy    seasonal    Coronary artery disease    Depression    situational    Heart murmur    hx of in childhood    Hyperlipidemia    Hypertension    Injury of tendon of long head of right biceps    January 2016   Past Surgical History:  Procedure Laterality Date   APPENDECTOMY     CARDIAC CATHETERIZATION     COLONOSCOPY N/A 11/28/2014   Procedure: COLONOSCOPY;  Surgeon: Lamar JONETTA Aho, MD;  Location: [facility];  Service: Endoscopy;  Laterality: N/A;   colonscopy      removed polyps   CORONARY STENT INTERVENTION N/A 11/20/2017   Procedure: CORONARY STENT INTERVENTION;  Surgeon: Court Dorn PARAS, MD;  Location: [facility];  Service: Cardiovascular;  Laterality: N/A;   INGUINAL HERNIA REPAIR Left 06/12/2014   Procedure: LEFT INGUINAL HERNIA REPAIR WITH MESH;  Surgeon: Krystal Spinner, MD;  Location: [facility];  Service: General;  Laterality: Left;   INSERTION OF MESH Left 06/12/2014   Procedure: INSERTION OF MESH;  Surgeon: Krystal Spinner, MD;  Location: [facility];  Service: General;  Laterality: Left;   LEFT HEART CATH AND CORONARY ANGIOGRAPHY N/A 11/20/2017   Procedure: LEFT HEART CATH AND CORONARY ANGIOGRAPHY;  Surgeon: Court Dorn PARAS, MD;  Location: [facility];  Service: Cardiovascular;  Laterality: N/A;  MOUTH SURGERY     wisdom teeth removed;root canal - 03/2014   TONSILLECTOMY  1995   TONSILLECTOMY      reports that he quit smoking about 28 years ago. His smoking use included cigarettes. He started smoking about 51 years ago. He has a 34.5 pack-year smoking history. He has never used smokeless tobacco. He reports current alcohol use. He reports that he does not use drugs. family history includes AAA (abdominal aortic aneurysm) in his mother; Alzheimer's disease in his mother; Cancer in his paternal grandfather and another family member; Depression in an other family member; Diabetes in his father; Heart disease in his paternal grandfather and another family member;  Hyperlipidemia in an other family member; Hypertension in an other family member; Stroke in an other family member. Allergies  Allergen Reactions   Hornet Venom Anaphylaxis, Itching, Other (See Comments) and Swelling   Wasp Venom Anaphylaxis, Anxiety, Itching and Swelling   Yellow Jacket Venom Anaphylaxis, Anxiety, Other (See Comments) and Swelling   Poison Ivy Extract [Poison Ivy Extract] Itching and Rash   Poison Oak Extract [Poison Oak Extract] Itching and Rash     Review of Systems  Constitutional:  Negative for chills, fever, malaise/fatigue and weight loss.  HENT:  Negative for hearing loss.   Eyes:  Negative for blurred vision and double vision.  Respiratory:  Negative for cough and shortness of breath.   Cardiovascular:  Negative for chest pain, palpitations and leg swelling.  Gastrointestinal:  Negative for abdominal pain, blood in stool, constipation and diarrhea.  Genitourinary:  Negative for dysuria.  Skin:  Negative for rash.  Neurological:  Negative for dizziness, speech change, focal weakness, seizures, loss of consciousness and headaches.  Psychiatric/Behavioral:  Negative for depression.       Objective:     BP 122/70   Pulse 76   Temp 97.6 F (36.4 C) (Oral)   Ht 5' 6.54 (1.69 m)   Wt 181 lb 9.6 oz (82.4 kg)   SpO2 96%   BMI 28.84 kg/m  BP Readings from Last 3 Encounters:  12/08/23 122/70  11/22/23 122/86  10/11/23 132/82   Wt Readings from Last 3 Encounters:  12/08/23 181 lb 9.6 oz (82.4 kg)  11/22/23 178 lb (80.7 kg)  10/11/23 180 lb (81.6 kg)      Physical Exam Constitutional:      General: He is not in acute distress.    Appearance: He is well-developed.  HENT:     Head: Normocephalic and atraumatic.     Right Ear: External ear normal.     Left Ear: External ear normal.  Eyes:     Conjunctiva/sclera: Conjunctivae normal.     Pupils: Pupils are equal, round, and reactive to light.  Neck:     Thyroid : No thyromegaly.  Cardiovascular:      Rate and Rhythm: Normal rate and regular rhythm.     Heart sounds: Normal heart sounds. No murmur heard. Pulmonary:     Effort: No respiratory distress.     Breath sounds: No wheezing or rales.  Abdominal:     General: Bowel sounds are normal. There is no distension.     Palpations: Abdomen is soft. There is no mass.     Tenderness: There is no abdominal tenderness. There is no guarding or rebound.  Musculoskeletal:     Cervical back: Normal range of motion and neck supple.  Lymphadenopathy:     Cervical: No cervical adenopathy.  Skin:    Findings: No rash.  Comments: Feet reveal no lesions.  Monofilament intact throughout both feet  Neurological:     Mental Status: He is alert and oriented to person, place, and time.     Cranial Nerves: No cranial nerve deficit.      No results found for any visits on 12/08/23.    The ASCVD Risk score (Arnett DK, et al., 2019) failed to calculate for the following reasons:   Risk score cannot be calculated because patient has a medical history suggesting prior/existing ASCVD    Assessment & Plan:   Problem List Items Addressed This Visit   None Visit Diagnoses       Physical exam    -  Primary   Relevant Orders   Basic metabolic panel with GFR   Lipid panel   CBC with Differential/Platelet   Hepatic function panel     Gross hematuria       Relevant Orders   Urinalysis w microscopic + reflex cultur     Prediabetes       Relevant Orders   Hemoglobin A1c     Need for influenza vaccination       Relevant Orders   Flu vaccine trivalent PF, 6mos and older(Flulaval,Afluria,Fluarix,Fluzone) (Completed)     - Influenza vaccine given - Continue regular exercise habits.  He is doing excellent job of walking about 3+ miles per day. - Check urinalysis with reflex microscopy if indicated.  If confirms hematuria will need to see urologist for further evaluation of his recent hematuria.  He already has scheduled follow-up with  urologist soon - Will be due for repeat colonoscopy next year  No follow-ups on file.    Wolm Scarlet, MD

## 2023-12-09 LAB — URINALYSIS W MICROSCOPIC + REFLEX CULTURE
Bacteria, UA: NONE SEEN /HPF
Bilirubin Urine: NEGATIVE
Hyaline Cast: NONE SEEN /LPF
Ketones, ur: NEGATIVE
Leukocyte Esterase: NEGATIVE
Nitrites, Initial: NEGATIVE
Protein, ur: NEGATIVE
RBC / HPF: NONE SEEN /HPF (ref 0–2)
Specific Gravity, Urine: 1.028 (ref 1.001–1.035)
Squamous Epithelial / HPF: NONE SEEN /HPF (ref <=5)
WBC, UA: NONE SEEN /HPF (ref 0–5)
pH: 6.5 (ref 5.0–8.0)

## 2023-12-09 LAB — NO CULTURE INDICATED

## 2023-12-10 ENCOUNTER — Ambulatory Visit: Payer: Self-pay | Admitting: Family Medicine

## 2023-12-10 DIAGNOSIS — M79673 Pain in unspecified foot: Secondary | ICD-10-CM

## 2023-12-22 ENCOUNTER — Other Ambulatory Visit: Payer: Self-pay

## 2023-12-22 DIAGNOSIS — G8929 Other chronic pain: Secondary | ICD-10-CM | POA: Diagnosis not present

## 2023-12-22 DIAGNOSIS — M79671 Pain in right foot: Secondary | ICD-10-CM | POA: Diagnosis not present

## 2023-12-22 DIAGNOSIS — M25572 Pain in left ankle and joints of left foot: Secondary | ICD-10-CM | POA: Diagnosis not present

## 2023-12-22 DIAGNOSIS — M25571 Pain in right ankle and joints of right foot: Secondary | ICD-10-CM | POA: Diagnosis not present

## 2023-12-22 DIAGNOSIS — M79672 Pain in left foot: Secondary | ICD-10-CM | POA: Diagnosis not present

## 2023-12-22 DIAGNOSIS — E119 Type 2 diabetes mellitus without complications: Secondary | ICD-10-CM

## 2023-12-22 NOTE — Progress Notes (Signed)
   12/22/2023  Patient ID: Damon [name], male   DOB: 01-03-1961, 63 y.o.   MRN: [id number]  Pharmacy Quality Measure Review  This patient is appearing on a report for being at risk of failing the Glycemic Status Assessment in Diabetes measure this calendar year.    Last documented A1c 6.4 on 12/08/23  A1C is controlled, no further action needed at this time.  Jon VEAR Lindau, PharmD Clinical Pharmacist [phone]

## 2023-12-25 DIAGNOSIS — F900 Attention-deficit hyperactivity disorder, predominantly inattentive type: Secondary | ICD-10-CM | POA: Diagnosis not present

## 2024-01-01 ENCOUNTER — Ambulatory Visit (INDEPENDENT_AMBULATORY_CARE_PROVIDER_SITE_OTHER): Admitting: Family Medicine

## 2024-01-01 ENCOUNTER — Encounter: Payer: Self-pay | Admitting: Family Medicine

## 2024-01-01 VITALS — BP 120/72 | HR 92 | Temp 97.5°F | Wt 179.5 lb

## 2024-01-01 DIAGNOSIS — R059 Cough, unspecified: Secondary | ICD-10-CM

## 2024-01-01 LAB — POC COVID19 BINAXNOW: SARS Coronavirus 2 Ag: NEGATIVE

## 2024-01-01 MED ORDER — BENZONATATE 100 MG PO CAPS: 100.0000 mg | ORAL_CAPSULE | Freq: Three times a day (TID) | ORAL | 0 refills | Status: AC | PRN

## 2024-01-01 NOTE — Progress Notes (Signed)
 Established Patient Office Visit  Subjective   Patient ID: Damon [name], male    DOB: 26-Feb-1960  Age: 63 y.o. MRN: [id number]  Chief Complaint  Patient presents with   Cough   Nasal Congestion   Chest Pain    HPI   Damon [name] is seen with 2-day history of nasal congestion and cough.  Mostly clear sputum.  No documented fever.  He's had some bodyaches and malaise.  Denies any nausea, vomiting, or diarrhea.  No significant dyspnea.  No obvious wheezing.  No known sick contacts  Past Medical History:  Diagnosis Date   Allergy    seasonal    Coronary artery disease    Depression    situational    Heart murmur    hx of in childhood    Hyperlipidemia    Hypertension    Injury of tendon of long head of right biceps    January 2016   Past Surgical History:  Procedure Laterality Date   APPENDECTOMY     CARDIAC CATHETERIZATION     COLONOSCOPY N/A 11/28/2014   Procedure: COLONOSCOPY;  Surgeon: Lamar JONETTA Aho, MD;  Location: [facility];  Service: Endoscopy;  Laterality: N/A;   colonscopy      removed polyps   CORONARY STENT INTERVENTION N/A 11/20/2017   Procedure: CORONARY STENT INTERVENTION;  Surgeon: Court Dorn PARAS, MD;  Location: [facility];  Service: Cardiovascular;  Laterality: N/A;   INGUINAL HERNIA REPAIR Left 06/12/2014   Procedure: LEFT INGUINAL HERNIA REPAIR WITH MESH;  Surgeon: Krystal Spinner, MD;  Location: [facility];  Service: General;  Laterality: Left;   INSERTION OF MESH Left 06/12/2014   Procedure: INSERTION OF MESH;  Surgeon: Krystal Spinner, MD;  Location: [facility];  Service: General;  Laterality: Left;   LEFT HEART CATH AND CORONARY ANGIOGRAPHY N/A 11/20/2017   Procedure: LEFT HEART CATH AND CORONARY ANGIOGRAPHY;  Surgeon: Court Dorn PARAS, MD;  Location: [facility];  Service: Cardiovascular;  Laterality: N/A;   MOUTH SURGERY     wisdom teeth removed;root canal - 03/2014   TONSILLECTOMY  1995   TONSILLECTOMY      reports that he quit smoking about 28  years ago. His smoking use included cigarettes. He started smoking about 51 years ago. He has a 34.5 pack-year smoking history. He has never used smokeless tobacco. He reports current alcohol use. He reports that he does not use drugs. family history includes AAA (abdominal aortic aneurysm) in his mother; Alzheimer's disease in his mother; Cancer in his paternal grandfather and another family member; Depression in an other family member; Diabetes in his father; Heart disease in his paternal grandfather and another family member; Hyperlipidemia in an other family member; Hypertension in an other family member; Stroke in an other family member. Allergies  Allergen Reactions   Hornet Venom Anaphylaxis, Itching, Other (See Comments) and Swelling   Wasp Venom Anaphylaxis, Anxiety, Itching and Swelling   Yellow Jacket Venom Anaphylaxis, Anxiety, Other (See Comments) and Swelling   Poison Ivy Extract [Poison Ivy Extract] Itching and Rash   Poison Oak Extract [Poison Oak Extract] Itching and Rash    Review of Systems  Constitutional:  Negative for chills and fever.  HENT:  Positive for congestion.   Respiratory:  Positive for cough. Negative for hemoptysis, shortness of breath and wheezing.   Cardiovascular:  Negative for chest pain.      Objective:     BP 120/72   Pulse 92  Temp (!) 97.5 F (36.4 C) (Oral)   Wt 179 lb 8 oz (81.4 kg)   SpO2 99%   BMI 28.51 kg/m  BP Readings from Last 3 Encounters:  01/01/24 120/72  12/08/23 122/70  11/22/23 122/86   Wt Readings from Last 3 Encounters:  01/01/24 179 lb 8 oz (81.4 kg)  12/08/23 181 lb 9.6 oz (82.4 kg)  11/22/23 178 lb (80.7 kg)      Physical Exam Vitals reviewed.  Constitutional:      General: He is not in acute distress.    Appearance: He is well-developed. He is not toxic-appearing.  Cardiovascular:     Rate and Rhythm: Normal rate and regular rhythm.  Pulmonary:     Effort: Pulmonary effort is normal. No tachypnea or  respiratory distress.     Breath sounds: Normal breath sounds. No wheezing or rales.  Musculoskeletal:     Cervical back: Neck supple.  Lymphadenopathy:     Cervical: No cervical adenopathy.  Neurological:     Mental Status: He is alert.      No results found for any visits on 01/01/24.    The ASCVD Risk score (Arnett DK, et al., 2019) failed to calculate for the following reasons:   Risk score cannot be calculated because patient has a medical history suggesting prior/existing ASCVD    Assessment & Plan:   Upper respiratory infection with cough.  Suspect viral.  Check COVID.  If negative treat symptomatically.  Patient requesting Tessalon  which has worked previously for cough.  Sent in prescription for Tessalon  Perles 100 mg every 8 hours as needed for cough  COVID test negative.  Continue plenty of fluids, rest, Tessalon  as above.  Follow-up promptly for any fever, increased shortness of breath, or other concerns.  Wolm Scarlet, MD

## 2024-01-23 ENCOUNTER — Ambulatory Visit: Attending: Cardiovascular Disease | Admitting: Cardiovascular Disease

## 2024-01-23 ENCOUNTER — Encounter: Payer: Self-pay | Admitting: Cardiovascular Disease

## 2024-01-23 ENCOUNTER — Other Ambulatory Visit: Payer: Self-pay

## 2024-01-23 VITALS — BP 116/70 | HR 62 | Resp 16 | Ht 69.0 in | Wt 183.0 lb

## 2024-01-23 DIAGNOSIS — I2510 Atherosclerotic heart disease of native coronary artery without angina pectoris: Secondary | ICD-10-CM

## 2024-01-23 DIAGNOSIS — E785 Hyperlipidemia, unspecified: Secondary | ICD-10-CM

## 2024-01-23 DIAGNOSIS — E119 Type 2 diabetes mellitus without complications: Secondary | ICD-10-CM

## 2024-01-23 DIAGNOSIS — I10 Essential (primary) hypertension: Secondary | ICD-10-CM | POA: Diagnosis not present

## 2024-01-23 DIAGNOSIS — I7300 Raynaud's syndrome without gangrene: Secondary | ICD-10-CM | POA: Diagnosis not present

## 2024-01-23 MED ORDER — SEMAGLUTIDE(0.25 OR 0.5MG/DOS) 2 MG/3ML ~~LOC~~ SOPN: PEN_INJECTOR | SUBCUTANEOUS | 1 refills | Status: AC

## 2024-01-23 NOTE — Progress Notes (Signed)
 Cardiology Office Note:    Date:  01/23/2024   ID:  Damon [name], DOB 26-Aug-1960, MRN [id number]  PCP:  Micheal Wolm ORN, MD  Cardiologist:  Jerel Balding, MD  Electrophysiologist:  None   Referring MD: Micheal Wolm ORN, MD   Chief Complaint  Patient presents with   Coronary Artery Disease     History of Present Illness:    Damon [name] is a 63 y.o. male with a hx of acute non-STEMI leading to placement of a drug-eluting stent in the ramus intermedius artery November 20, 2017, hyperlipidemia, hypertension, Raynaud's syndrome.    He has done well since his last appointment.  He now has an Australian Shepherd dog which he walks daily so thinks he is getting more exercise (he estimates between 6000 and 10,000 steps a day, depending on the day).  He has lost a little bit of weight, but remains overweight.  He has not had any angina or dyspnea with activity.  His major complaint today is of bilateral numbness in his feet, little worse on the left.  He denies orthopnea, PND, lower extremity edema, claudication or focal neurological complaints otherwise.  He has not had palpitations, dizziness or syncope.  He does have occasional purple discoloration of his fingers consistent with her previous diagnosis of Raynaud's syndrome.  It is never severe.  Unfortunately his metabolic profile is slightly worse despite this.  Even though he is taking Farxiga  10 mg once daily his hemoglobin A1c is now 6.4%.  Clearly without the medication he would be in full diabetes mellitus type 2 range.  He has had a little bit of success with weight loss with Farxiga  and is no longer in fully obese range, but his BMI remains above 27.  Conversely, on rosuvastatin  plus ezetimibe  at maximum doses, his lipid profile looks pretty good with an LDL cholesterol of 46, HDL 45 and normal triglycerides at 68.  He has normal renal function with a creatinine of 0.85.  Echo at presentation in September 2019 showed LVEF 45-50%  with lateral wall motion abnormalities.  Follow-up echo on December 5 shows resolution of the regional abnormalities with an EF of 50-55%.   Past Medical History:  Diagnosis Date   Allergy    seasonal    Coronary artery disease    Depression    situational    Heart murmur    hx of in childhood    Hyperlipidemia    Hypertension    Injury of tendon of long head of right biceps    January 2016    Past Surgical History:  Procedure Laterality Date   APPENDECTOMY     CARDIAC CATHETERIZATION     COLONOSCOPY N/A 11/28/2014   Procedure: COLONOSCOPY;  Surgeon: Lamar JONETTA Aho, MD;  Location: [facility];  Service: Endoscopy;  Laterality: N/A;   colonscopy      removed polyps   CORONARY STENT INTERVENTION N/A 11/20/2017   Procedure: CORONARY STENT INTERVENTION;  Surgeon: Court Dorn PARAS, MD;  Location: [facility];  Service: Cardiovascular;  Laterality: N/A;   INGUINAL HERNIA REPAIR Left 06/12/2014   Procedure: LEFT INGUINAL HERNIA REPAIR WITH MESH;  Surgeon: Krystal Spinner, MD;  Location: [facility];  Service: General;  Laterality: Left;   INSERTION OF MESH Left 06/12/2014   Procedure: INSERTION OF MESH;  Surgeon: Krystal Spinner, MD;  Location: [facility];  Service: General;  Laterality: Left;   LEFT HEART CATH AND CORONARY ANGIOGRAPHY N/A 11/20/2017   Procedure: LEFT HEART  CATH AND CORONARY ANGIOGRAPHY;  Surgeon: Court Dorn PARAS, MD;  Location: [facility] INVASIVE CV LAB;  Service: Cardiovascular;  Laterality: N/A;   MOUTH SURGERY     wisdom teeth removed;root canal - 03/2014   TONSILLECTOMY  1995   TONSILLECTOMY      Current Medications: Current Meds  Medication Sig   amLODipine  (NORVASC ) 5 MG tablet Take 1 tablet (5 mg total) by mouth daily.   aspirin  EC 81 MG tablet Take 1 tablet (81 mg total) by mouth daily.   cetirizine (ZYRTEC) 10 MG tablet Take 10 mg by mouth 2 (two) times daily as needed.    clonazePAM  (KLONOPIN ) 0.5 MG tablet Take 0.5-1 mg by mouth See admin instructions. Take 2 tablets  in the morning and 1 tablet in the evening. May take 1 additional tablet as needed for anxiety   dapagliflozin  propanediol (FARXIGA ) 10 MG TABS tablet Take 1 tablet (10 mg total) by mouth daily before breakfast.   ezetimibe  (ZETIA ) 10 MG tablet Take 1 tablet (10 mg total) by mouth daily.   lisdexamfetamine (VYVANSE) 30 MG capsule Take 30 mg by mouth daily in the afternoon.   lisinopril  (ZESTRIL ) 20 MG tablet Take 1 tablet (20 mg total) by mouth daily.   nitroGLYCERIN  (NITRODUR - DOSED IN MG/24 HR) 0.2 mg/hr patch Apply 1/4th patch to affected shoulder, change daily   nitroGLYCERIN  (NITROSTAT ) 0.4 MG SL tablet ONE TAB UNDER TONGUE AT ONSET OF CHEST PAIN. MAY REPEAT IN 5 MIN-MAX 3 DOSES. THEN CALL 911.   rosuvastatin  (CRESTOR ) 40 MG tablet Take 1 tablet (40 mg total) by mouth daily.     Allergies:   Hornet venom, Wasp venom, Yellow jacket venom, Poison ivy extract [poison ivy extract], and Poison oak extract [poison oak extract]   Family History: The patient's family history includes AAA (abdominal aortic aneurysm) in his mother; Alzheimer's disease in his mother; Cancer in his paternal grandfather and another family member; Depression in an other family member; Diabetes in his father; Heart disease in his paternal grandfather and another family member; Hyperlipidemia in an other family member; Hypertension in an other family member; Stroke in an other family member.  ROS:   Please see the history of present illness.   All other systems are reviewed and are negative.   EKGs/Labs/Other Studies Reviewed:    The following studies were reviewed today: Cardiac catheterization 11/20/2017 Ost Ramus to Ramus lesion is 99% stenosed. A drug-eluting stent was successfully placed. Post intervention, there is a 0% residual stenosis. There is mild left ventricular systolic dysfunction. LV end diastolic pressure is moderately elevated. The left ventricular ejection fraction is 45-50% by visual estimate.    Diagnostic Dominance: Co-dominant Intervention  Implants     Permanent Stent  Stent Synergy Des 2.25x16    Echocardiogram 01/25/2018 - Left ventricle: The cavity size was normal. Systolic function was    normal. The estimated ejection fraction was in the range of 50%    to 55%. Wall motion was normal; there were no regional wall    motion abnormalities. The study is indeterminate for the    evaluation of LV diastolic function.  - Mitral valve: There was no significant regurgitation.  - Right ventricle: Systolic function was normal.  - Atrial septum: No defect or patent foramen ovale was identified.  - Tricuspid valve: There was no significant regurgitation.   EKG:   EKG Interpretation Date/Time:  Tuesday January 23 2024 09:55:14 EST Ventricular Rate:  77 PR Interval:  160 QRS Duration:  86 QT Interval:  378 QTC Calculation: 427 R Axis:   -10  Text Interpretation: Normal sinus rhythm Normal ECG When compared with ECG of 16-Jan-2023 14:32, No significant change was found Confirmed by Lilygrace Rodick ([id number]) on 01/23/2024 11:26:30 AM        Recent Labs: 12/08/2023: ALT 20; BUN 20; Creatinine, Ser 0.85; Hemoglobin 15.5; Platelets 216.0; Potassium 4.5; Sodium 136  Hemoglobin A1c 6.2% Recent Lipid Panel    Component Value Date/Time   CHOL 105 12/08/2023 0944   CHOL 117 05/13/2021 1114   TRIG 68.0 12/08/2023 0944   HDL 45.20 12/08/2023 0944   HDL 50 05/13/2021 1114   CHOLHDL 2 12/08/2023 0944   VLDL 13.6 12/08/2023 0944   LDLCALC 46 12/08/2023 0944   LDLCALC 48 05/13/2021 1114   LDLCALC 35 11/22/2019 1117   LDLDIRECT 93.0 12/09/2015 0820   11/24/2020 Cholesterol 146, HDL 46, LDL 73, triglycerides 133  Hemoglobin A1c 6.1%, hemoglobin 14.4, creatinine 0.8, potassium 4.1, ALT 30, TSH 1.23  Physical Exam:    VS:  BP 116/70 (BP Location: Right Arm, Patient Position: Sitting, Cuff Size: Normal)   Pulse 62   Resp 16   Ht 5' 9 (1.753 m)   Wt 183 lb (83 kg)    SpO2 92%   BMI 27.02 kg/m     Wt Readings from Last 3 Encounters:  01/23/24 183 lb (83 kg)  01/01/24 179 lb 8 oz (81.4 kg)  12/08/23 181 lb 9.6 oz (82.4 kg)     General: Alert, oriented x3, no distress, moderately overweight Head: no evidence of trauma, PERRL, EOMI, no exophtalmos or lid lag, no myxedema, no xanthelasma; normal ears, nose and oropharynx Neck: normal jugular venous pulsations and no hepatojugular reflux; brisk carotid pulses without delay and no carotid bruits Chest: clear to auscultation, no signs of consolidation by percussion or palpation, normal fremitus, symmetrical and full respiratory excursions Cardiovascular: normal position and quality of the apical impulse, regular rhythm, normal first and second heart sounds, no murmurs, rubs or gallops Abdomen: no tenderness or distention, no masses by palpation, no abnormal pulsatility or arterial bruits, normal bowel sounds, no hepatosplenomegaly Extremities: His fingertips are mildly cyanotic bilaterally, but he has excellent pulses in both hands, no clubbing or edema; 2+ radial, ulnar and brachial pulses bilaterally; 2+ right femoral, posterior tibial and dorsalis pedis pulses; 2+ left femoral, posterior tibial and dorsalis pedis pulses; no subclavian or femoral bruits Neurological: grossly nonfocal Psych: Normal mood and affect     ASSESSMENT:    1. Coronary artery disease involving native coronary artery of native heart without angina pectoris   2. Dyslipidemia   3. Essential hypertension   4. Raynaud's disease without gangrene   5. Type 2 diabetes mellitus without complication, without long-term current use of insulin (HCC)       PLAN:    In order of problems listed above:  CAD: Remains asymptomatic more than 6 years following his initial presentation with non-STEMI and drug-eluting stent to the ramus intermedius artery.  He is on aspirin  and high intensity lipid-lowering therapy, but cannot take beta-blockers  due to Raynaud's syndrome which is obvious today.  HLP: Lipid parameters are all in target range.  Encourage him to get at least 2-1/2 hours of moderate physical exercise and another half hour of more intense, preferably weightbearing exercise every week.   Raynaud's syndrome: Obvious today, but mild.  On amlodipine  and avoiding beta-blockers.  If he does need beta-blockers in the future, prefer using carvedilol  or  Nebivolol for the alpha blocking effect. HTN: Very well-controlled. Diabetes mellitus type 2: On monotherapy with SGLT2 inhibitors he has an acceptable hemoglobin A1c of 6.4%.  However with a history of previous myocardial infarction and elevated BMI he would benefit forearm simultaneous treatment with a GLP-1 agonist.  Which would also help with his efforts at weight loss, which has stagnated.  Will start with Ozempic  0.25 mg weekly, pending approval of coverage by his insurance company.  He has only had diabetes mellitus for a relatively short period of time so I do not know that the numbness in his feet represents diabetic neuropathy.  There may be other etiologies.  Medication Adjustments/Labs and Tests Ordered: Current medicines are reviewed at length with the patient today.  Concerns regarding medicines are outlined above.  Orders Placed This Encounter  Procedures   EKG 12-Lead    No orders of the defined types were placed in this encounter.    Patient Instructions  Medication Instructions:  Your physician recommends that you continue on your current medications as directed. Please refer to the Current Medication list given to you today.  *If you need a refill on your cardiac medications before your next appointment, please call your pharmacy*  Lab Work: NONE If you have labs (blood work) drawn today and your tests are completely normal, you will receive your results only by: MyChart Message (if you have MyChart) OR A paper copy in the mail If you have any lab test that  is abnormal or we need to change your treatment, we will call you to review the results.  Testing/Procedures: NONE  Follow-Up: At [facility], you and your health needs are our priority.  As part of our continuing mission to provide you with exceptional heart care, our providers are all part of one team.  This team includes your primary Cardiologist (physician) and Advanced Practice Providers or APPs (Physician Assistants and Nurse Practitioners) who all work together to provide you with the care you need, when you need it.  Your next appointment:   1 year(s)  Provider:   Jerel Balding, MD    We recommend signing up for the patient portal called MyChart.  Sign up information is provided on this After Visit Summary.  MyChart is used to connect with patients for Virtual Visits (Telemedicine).  Patients are able to view lab/test results, encounter notes, upcoming appointments, etc.  Non-urgent messages can be sent to your provider as well.   To learn more about what you can do with MyChart, go to [email or web address].       Signed, Jerel Balding, MD  01/23/2024 2:59 PM     [facility]

## 2024-01-23 NOTE — Progress Notes (Signed)
 Ozempic  ordered per Dr. Francyne with instructions from [facility] : Ozempic  starting dose is 0.25mg  once weekly x 4 weeks and then increase to 0.5mg  weekly. Dispense 1 pen with 1 refill  Pt notified via voicemail per [facility]

## 2024-01-23 NOTE — Patient Instructions (Signed)
 Medication Instructions:  Your physician recommends that you continue on your current medications as directed. Please refer to the Current Medication list given to you today.  *If you need a refill on your cardiac medications before your next appointment, please call your pharmacy*  Lab Work: NONE If you have labs (blood work) drawn today and your tests are completely normal, you will receive your results only by: MyChart Message (if you have MyChart) OR A paper copy in the mail If you have any lab test that is abnormal or we need to change your treatment, we will call you to review the results.  Testing/Procedures: NONE  Follow-Up: At [facility], you and your health needs are our priority.  As part of our continuing mission to provide you with exceptional heart care, our providers are all part of one team.  This team includes your primary Cardiologist (physician) and Advanced Practice Providers or APPs (Physician Assistants and Nurse Practitioners) who all work together to provide you with the care you need, when you need it.  Your next appointment:   1 year(s)  Provider:   Luana Rumple, MD   We recommend signing up for the patient portal called "MyChart".  Sign up information is provided on this After Visit Summary.  MyChart is used to connect with patients for Virtual Visits (Telemedicine).  Patients are able to view lab/test results, encounter notes, upcoming appointments, etc.  Non-urgent messages can be sent to your provider as well.   To learn more about what you can do with MyChart, go to [email or web address].

## 2024-01-24 DIAGNOSIS — F411 Generalized anxiety disorder: Secondary | ICD-10-CM | POA: Diagnosis not present

## 2024-01-25 ENCOUNTER — Encounter: Payer: Self-pay | Admitting: Emergency Medicine

## 2024-01-26 ENCOUNTER — Telehealth: Payer: Self-pay | Admitting: Pharmacy Technician

## 2024-01-26 MED ORDER — METFORMIN HCL 500 MG PO TABS: 500.0000 mg | ORAL_TABLET | Freq: Two times a day (BID) | ORAL | 3 refills | Status: AC

## 2024-01-26 NOTE — Telephone Encounter (Signed)
 Hi, insurance is asking for medical records proving type 2 diabetes - I see type 2 diabetes listed on the visit diagnoses on the 01/23/24 visit but no labs can be found supporting that diagnosis to send to the insurance. Also, insurance is asking Has the patient tried and had an inadequate response, intolerance, or hypersensitivity to any of the following medications: metformin , a sulfonylurea, or an insulin product? And Does the patient have an FDA labeled contraindication to metformin ?  There no records showing he has had metformin  to send to the insurance. Those things would have to happen before insurance would approve this prior authorization. Thank you   Pharmacy Patient Advocate Encounter   Received notification from CoverMyMeds that prior authorization for ozempic  is required/requested.   Insurance verification completed.   The patient is insured through [facility].   Per test claim: PA required; PA started via CoverMyMeds. KEY [id number] . Please see clinical question(s) below that I am not finding the answer to in their chart and advise.

## 2024-01-26 NOTE — Addendum Note (Signed)
 Addended by: Triana Coover L on: 01/26/2024 05:59 PM   Modules accepted: Orders

## 2024-01-26 NOTE — Telephone Encounter (Signed)
 He has a HgbA1c 4f 6.4% despite being on Farxiga . We have not tried metformin , that is true. Will start metformin  and try again in the future if necessary. Please Rx Metformin  500 mg twice daily. If he does not tolerate this due to GI side effects, wil try Ozempic  prescription again.

## 2024-01-26 NOTE — Telephone Encounter (Signed)
 RX sent for Metformin . Sent Mychart message

## 2024-01-26 NOTE — Telephone Encounter (Signed)
 Message sent to provider and covering nurse to follow up

## 2024-02-05 DIAGNOSIS — M7742 Metatarsalgia, left foot: Secondary | ICD-10-CM | POA: Diagnosis not present

## 2024-02-06 DIAGNOSIS — Z87438 Personal history of other diseases of male genital organs: Secondary | ICD-10-CM | POA: Diagnosis not present

## 2024-02-06 DIAGNOSIS — R935 Abnormal findings on diagnostic imaging of other abdominal regions, including retroperitoneum: Secondary | ICD-10-CM | POA: Diagnosis not present

## 2024-02-06 DIAGNOSIS — R972 Elevated prostate specific antigen [PSA]: Secondary | ICD-10-CM | POA: Diagnosis not present

## 2024-02-10 DIAGNOSIS — M7742 Metatarsalgia, left foot: Secondary | ICD-10-CM | POA: Diagnosis not present

## 2024-02-26 ENCOUNTER — Telehealth (HOSPITAL_BASED_OUTPATIENT_CLINIC_OR_DEPARTMENT_OTHER): Payer: Self-pay | Admitting: *Deleted

## 2024-02-26 NOTE — Telephone Encounter (Signed)
"  ° °  Pre-operative Risk Assessment    Patient Name: Damon [name]  DOB: [birth year] MRN: [id number]   Date of last office visit: 01/23/24 DR. CROITORU Date of next office visit: NONE   Request for Surgical Clearance    Procedure:  EXCISION OF LEFT SECOND WEBSPACE MORTON'S NEUROME. LEFT 2ND AND 3RD METATARSAL WEIL OSTEOTOMIES (OPEN), LEFT SECOND HAMMERTOE CORRECTION  Date of Surgery:  Clearance TBD                                Surgeon:  DR. NORLEEN HEWITT Surgeon's Group or Practice Name:  [facility] Phone number:  [phone] MEGAN DAVIS Fax number:  [phone]   Type of Clearance Requested:   - Medical  - Pharmacy:  Hold Aspirin      Type of Anesthesia:  General    Additional requests/questions:    Bonney Niels Jest   02/26/2024, 4:51 PM   "

## 2024-02-27 NOTE — Telephone Encounter (Signed)
"  ° °  Patient Name: Damon [name]  DOB: Feb 05, 1961 MRN: [id number]  Primary Cardiologist: Jerel Balding, MD  Chart reviewed as part of pre-operative protocol coverage.  Patient was last seen in clinic by Dr. Balding on 01/23/2024, per Dr. Balding patient is low risk. Given past medical history and time since last visit, based on ACC/AHA guidelines, OZIAS DICENZO is at acceptable risk for the planned procedure without further cardiovascular testing.   Aspirin  may be held for 5 to 7 days prior to procedure, please resume as soon as safe to do so from a bleeding standpoint.  I will route this recommendation to the requesting party via Epic fax function and remove from pre-op pool.  Please call with questions.  Journey Castonguay D Glenis Musolf, NP 02/27/2024, 8:47 AM  "

## 2024-02-27 NOTE — Telephone Encounter (Signed)
 Low risk, OK to hold ASA. Thanks

## 2024-03-01 ENCOUNTER — Ambulatory Visit: Admitting: Family Medicine

## 2024-03-01 ENCOUNTER — Other Ambulatory Visit: Payer: Self-pay

## 2024-03-01 VITALS — BP 129/78 | Ht 69.0 in | Wt 178.0 lb

## 2024-03-01 DIAGNOSIS — M79672 Pain in left foot: Secondary | ICD-10-CM

## 2024-03-01 NOTE — Progress Notes (Signed)
 "  Established Patient Office Visit  PCP: Micheal Wolm ORN, MD  Patient is a 64 y.o. male here for second opinion regarding metatarsalgia of the left foot.  He has been seen multiple times previously for evaluation of left foot metatarsalgia.  He wears boots with metatarsal pads that do not adequately alleviate his discomfort.  Most recently, he has been evaluated for left foot metatarsalgia at [facility].  He met with Dr. Kit earlier this week to review recent MRI results, which showed a Morton's neuroma.  Given his failure of conservative management, findings of Morton's neuroma, hammertoe deformity of the second digit, second MTP joint instability, operative intervention is recommended.  He is tentatively scheduled for surgery with Dr. Kit on 1/27.  He has multiple questions about surgery such as recovery time, risk of nerve damage, and if surgery is truly indicated.  He continues to endorse an adequate comfort despite consistently using boots with metatarsal pads.    Past Medical History:  Diagnosis Date   Allergy    seasonal    Coronary artery disease    Depression    situational    Heart murmur    hx of in childhood    Hyperlipidemia    Hypertension    Injury of tendon of long head of right biceps    January 2016    Medications Ordered Prior to Encounter[1]  Past Surgical History:  Procedure Laterality Date   APPENDECTOMY     CARDIAC CATHETERIZATION     COLONOSCOPY N/A 11/28/2014   Procedure: COLONOSCOPY;  Surgeon: Lamar JONETTA Aho, MD;  Location: [facility];  Service: Endoscopy;  Laterality: N/A;   colonscopy      removed polyps   CORONARY STENT INTERVENTION N/A 11/20/2017   Procedure: CORONARY STENT INTERVENTION;  Surgeon: Court Dorn PARAS, MD;  Location: [facility];  Service: Cardiovascular;  Laterality: N/A;   INGUINAL HERNIA REPAIR Left 06/12/2014   Procedure: LEFT INGUINAL HERNIA REPAIR WITH MESH;  Surgeon: Krystal Spinner, MD;  Location: [facility];  Service:  General;  Laterality: Left;   INSERTION OF MESH Left 06/12/2014   Procedure: INSERTION OF MESH;  Surgeon: Krystal Spinner, MD;  Location: [facility];  Service: General;  Laterality: Left;   LEFT HEART CATH AND CORONARY ANGIOGRAPHY N/A 11/20/2017   Procedure: LEFT HEART CATH AND CORONARY ANGIOGRAPHY;  Surgeon: Court Dorn PARAS, MD;  Location: [facility];  Service: Cardiovascular;  Laterality: N/A;   MOUTH SURGERY     wisdom teeth removed;root canal - 03/2014   TONSILLECTOMY  1995   TONSILLECTOMY      Allergies[2]  BP 129/78   Ht 5' 9 (1.753 m)   Wt 178 lb (80.7 kg)   BMI 26.29 kg/m       No data to display              No data to display              Objective:  Physical Exam:  Gen: NAD, comfortable in exam room  Left foot Transverse arch collapse Hammertoe deformity of second digit TTP over the metatarsal head  Limited MSK US : Left foot Well-defined, circumferential deformity noted between the 2nd and 3rd metatarsals that appears most consistent with neuroma   Assessment and Plan:  Left foot metatarsalgia Presenting today to further discuss management options for persistent left foot metatarsalgia.  He is tentatively scheduled for surgery on 1/27 given failure of conservative treatment, presence of Morton's neuroma, second MTP joint  instability, and hammertoe deformity.  Limited ultrasound of the left foot redemonstrates a neuroma between the 2nd and 3rd metatarsals, consistent with noted findings on MRI. -We recommended proceeding with surgery as currently scheduled with Dr. Kit.  He has multiple questions related to surgery, such as recovery time and the potential for nerve damage.  We recommended that he contact Dr. Patric office to have these concerns best addressed.  He will follow-up at [facility] as needed.   Manus FORBES Fireman, MD     [1]  Current Outpatient Medications on File Prior to Visit  Medication Sig Dispense Refill   amLODipine  (NORVASC ) 5 MG  tablet Take 1 tablet (5 mg total) by mouth daily. 90 tablet 3   aspirin  EC 81 MG tablet Take 1 tablet (81 mg total) by mouth daily.     benzonatate  (TESSALON ) 100 MG capsule Take 1 capsule (100 mg total) by mouth 3 (three) times daily as needed for cough. (Patient not taking: Reported on 01/23/2024) 30 capsule 0   cetirizine (ZYRTEC) 10 MG tablet Take 10 mg by mouth 2 (two) times daily as needed.      clonazePAM  (KLONOPIN ) 0.5 MG tablet Take 0.5-1 mg by mouth See admin instructions. Take 2 tablets in the morning and 1 tablet in the evening. May take 1 additional tablet as needed for anxiety     dapagliflozin  propanediol (FARXIGA ) 10 MG TABS tablet Take 1 tablet (10 mg total) by mouth daily before breakfast. 90 tablet 1   ezetimibe  (ZETIA ) 10 MG tablet Take 1 tablet (10 mg total) by mouth daily. 90 tablet 3   lisdexamfetamine (VYVANSE) 30 MG capsule Take 30 mg by mouth daily in the afternoon.     lisinopril  (ZESTRIL ) 20 MG tablet Take 1 tablet (20 mg total) by mouth daily. 90 tablet 3   metFORMIN  (GLUCOPHAGE ) 500 MG tablet Take 1 tablet (500 mg total) by mouth 2 (two) times daily with a meal. 60 tablet 3   nitroGLYCERIN  (NITRODUR - DOSED IN MG/24 HR) 0.2 mg/hr patch Apply 1/4th patch to affected shoulder, change daily 30 patch 1   nitroGLYCERIN  (NITROSTAT ) 0.4 MG SL tablet ONE TAB UNDER TONGUE AT ONSET OF CHEST PAIN. MAY REPEAT IN 5 MIN-MAX 3 DOSES. THEN CALL 911. 25 tablet 1   rosuvastatin  (CRESTOR ) 40 MG tablet Take 1 tablet (40 mg total) by mouth daily. 90 tablet 3   Semaglutide ,0.25 or 0.5MG /DOS, 2 MG/3ML SOPN Inject 0.25 mg subcutaneous once weekly for 4 weeks and then increase to 0.5 mg weekly 3 mL 1   No current facility-administered medications on file prior to visit.  [2]  Allergies Allergen Reactions   Hornet Venom Anaphylaxis, Itching, Other (See Comments) and Swelling   Wasp Venom Anaphylaxis, Anxiety, Itching and Swelling   Yellow Jacket Venom Anaphylaxis, Anxiety, Other (See  Comments) and Swelling   Poison Ivy Extract [Poison Ivy Extract] Itching and Rash   Poison [employer] Oak Extract] Itching and Rash   "

## 2024-03-04 ENCOUNTER — Other Ambulatory Visit: Payer: Self-pay | Admitting: Cardiovascular Disease

## 2024-03-20 ENCOUNTER — Telehealth: Payer: Self-pay | Admitting: Family Medicine

## 2024-03-20 DIAGNOSIS — M549 Dorsalgia, unspecified: Secondary | ICD-10-CM

## 2024-03-20 NOTE — Telephone Encounter (Signed)
 I spoke with the patient and he reported he had foot surgery 03/19/2024 FYI- Patient also inquired if PCP is willing to accept a friend Marijo Fisherman as a new patient

## 2024-03-20 NOTE — Telephone Encounter (Signed)
 Copied from CRM #[id number]. Topic: General - Other >> Mar 20, 2024 10:39 AM Larissa RAMAN wrote: Reason for CRM: Requesting a callback from PCP/nurse to refer a new patient. Advised Dr. Micheal is currently not accepting new patients. Requesting a callback from PCP/nurse.

## 2024-03-21 NOTE — Telephone Encounter (Signed)
 Patient informed of the message below.

## 2024-03-21 NOTE — Telephone Encounter (Signed)
 Patient inquired if PCP can place a referral to PT for back pain at [facility]

## 2024-03-22 NOTE — Addendum Note (Signed)
 Addended by: METTA KRISTEN CROME on: 03/22/2024 08:01 AM   Modules accepted: Orders

## 2024-12-10 ENCOUNTER — Encounter: Admitting: Family Medicine
